# Patient Record
Sex: Male | Born: 1945 | Race: Black or African American | Hispanic: No | Smoking: Current every day smoker
Health system: Southern US, Community
[De-identification: ages and names within clinical notes are randomized; demographics above are authoritative.]

## PROBLEM LIST (undated history)

## (undated) DIAGNOSIS — Z8719 Personal history of other diseases of the digestive system: Secondary | ICD-10-CM

## (undated) DIAGNOSIS — E119 Type 2 diabetes mellitus without complications: Secondary | ICD-10-CM

## (undated) DIAGNOSIS — E785 Hyperlipidemia, unspecified: Secondary | ICD-10-CM

## (undated) DIAGNOSIS — B029 Zoster without complications: Secondary | ICD-10-CM

## (undated) DIAGNOSIS — I10 Essential (primary) hypertension: Secondary | ICD-10-CM

## (undated) HISTORY — PX: OTHER SURGICAL HISTORY: SHX169

## (undated) HISTORY — DX: Hyperlipidemia, unspecified: E78.5

## (undated) HISTORY — DX: Type 2 diabetes mellitus without complications: E11.9

## (undated) HISTORY — DX: Zoster without complications: B02.9

## (undated) HISTORY — DX: Essential (primary) hypertension: I10

---

## 2004-09-26 ENCOUNTER — Emergency Department: Payer: Self-pay | Admitting: Emergency Medicine

## 2006-06-07 ENCOUNTER — Other Ambulatory Visit: Payer: Self-pay

## 2006-06-07 ENCOUNTER — Inpatient Hospital Stay: Payer: Self-pay | Admitting: Internal Medicine

## 2007-05-17 ENCOUNTER — Inpatient Hospital Stay: Payer: Self-pay | Admitting: Internal Medicine

## 2007-05-17 ENCOUNTER — Other Ambulatory Visit: Payer: Self-pay

## 2007-07-03 ENCOUNTER — Emergency Department: Payer: Self-pay | Admitting: Emergency Medicine

## 2009-06-23 ENCOUNTER — Emergency Department: Payer: Self-pay | Admitting: Emergency Medicine

## 2010-02-27 ENCOUNTER — Ambulatory Visit: Payer: Self-pay | Admitting: Ophthalmology

## 2010-03-04 ENCOUNTER — Ambulatory Visit: Payer: Self-pay | Admitting: Cardiovascular Disease

## 2010-03-11 ENCOUNTER — Ambulatory Visit: Payer: Self-pay | Admitting: Ophthalmology

## 2013-01-26 DIAGNOSIS — Z9849 Cataract extraction status, unspecified eye: Secondary | ICD-10-CM | POA: Insufficient documentation

## 2013-05-16 ENCOUNTER — Emergency Department: Payer: Self-pay | Admitting: Emergency Medicine

## 2013-05-16 LAB — CBC
HGB: 12.2 g/dL — ABNORMAL LOW (ref 13.0–18.0)
MCH: 33.8 pg (ref 26.0–34.0)
MCV: 99 fL (ref 80–100)
Platelet: 196 10*3/uL (ref 150–440)
RDW: 13 % (ref 11.5–14.5)
WBC: 7.6 10*3/uL (ref 3.8–10.6)

## 2013-05-16 LAB — COMPREHENSIVE METABOLIC PANEL
Albumin: 3.6 g/dL (ref 3.4–5.0)
Alkaline Phosphatase: 87 U/L
Anion Gap: 5 — ABNORMAL LOW (ref 7–16)
Bilirubin,Total: 0.3 mg/dL (ref 0.2–1.0)
Calcium, Total: 9.3 mg/dL (ref 8.5–10.1)
Creatinine: 1.18 mg/dL (ref 0.60–1.30)
EGFR (African American): >60
Glucose: 360 mg/dL — ABNORMAL HIGH (ref 65–99)
Potassium: 4.3 mmol/L (ref 3.5–5.1)
SGOT(AST): 15 U/L (ref 15–37)
SGPT (ALT): 16 U/L (ref 12–78)
Total Protein: 7.4 g/dL (ref 6.4–8.2)

## 2013-08-21 DIAGNOSIS — N2889 Other specified disorders of kidney and ureter: Secondary | ICD-10-CM | POA: Insufficient documentation

## 2013-08-22 DIAGNOSIS — IMO0002 Reserved for concepts with insufficient information to code with codable children: Secondary | ICD-10-CM | POA: Insufficient documentation

## 2013-08-25 DIAGNOSIS — K5790 Diverticulosis of intestine, part unspecified, without perforation or abscess without bleeding: Secondary | ICD-10-CM | POA: Insufficient documentation

## 2013-08-25 DIAGNOSIS — K648 Other hemorrhoids: Secondary | ICD-10-CM | POA: Insufficient documentation

## 2014-04-13 DIAGNOSIS — N5201 Erectile dysfunction due to arterial insufficiency: Secondary | ICD-10-CM | POA: Insufficient documentation

## 2015-12-13 DIAGNOSIS — E8729 Other acidosis: Secondary | ICD-10-CM | POA: Insufficient documentation

## 2017-07-12 ENCOUNTER — Other Ambulatory Visit: Payer: Self-pay

## 2017-07-12 ENCOUNTER — Inpatient Hospital Stay
Admission: EM | Admit: 2017-07-12 | Discharge: 2017-07-14 | DRG: 347 | Discharge status: Against Medical Advice | Payer: Medicare Other | Attending: Internal Medicine | Admitting: Internal Medicine

## 2017-07-12 ENCOUNTER — Encounter: Payer: Self-pay | Admitting: Emergency Medicine

## 2017-07-12 DIAGNOSIS — K644 Residual hemorrhoidal skin tags: Secondary | ICD-10-CM | POA: Diagnosis present

## 2017-07-12 DIAGNOSIS — K529 Noninfective gastroenteritis and colitis, unspecified: Secondary | ICD-10-CM | POA: Diagnosis present

## 2017-07-12 DIAGNOSIS — N183 Chronic kidney disease, stage 3 (moderate): Secondary | ICD-10-CM | POA: Diagnosis present

## 2017-07-12 DIAGNOSIS — I868 Varicose veins of other specified sites: Secondary | ICD-10-CM | POA: Diagnosis present

## 2017-07-12 DIAGNOSIS — E119 Type 2 diabetes mellitus without complications: Secondary | ICD-10-CM | POA: Diagnosis present

## 2017-07-12 DIAGNOSIS — K228 Other specified diseases of esophagus: Secondary | ICD-10-CM | POA: Diagnosis not present

## 2017-07-12 DIAGNOSIS — K5730 Diverticulosis of large intestine without perforation or abscess without bleeding: Secondary | ICD-10-CM | POA: Diagnosis not present

## 2017-07-12 DIAGNOSIS — K648 Other hemorrhoids: Secondary | ICD-10-CM | POA: Diagnosis not present

## 2017-07-12 DIAGNOSIS — K226 Gastro-esophageal laceration-hemorrhage syndrome: Secondary | ICD-10-CM | POA: Diagnosis present

## 2017-07-12 DIAGNOSIS — F17210 Nicotine dependence, cigarettes, uncomplicated: Secondary | ICD-10-CM | POA: Diagnosis present

## 2017-07-12 DIAGNOSIS — Z79899 Other long term (current) drug therapy: Secondary | ICD-10-CM | POA: Diagnosis not present

## 2017-07-12 DIAGNOSIS — K5731 Diverticulosis of large intestine without perforation or abscess with bleeding: Secondary | ICD-10-CM | POA: Diagnosis present

## 2017-07-12 DIAGNOSIS — K259 Gastric ulcer, unspecified as acute or chronic, without hemorrhage or perforation: Secondary | ICD-10-CM | POA: Diagnosis present

## 2017-07-12 DIAGNOSIS — Z7984 Long term (current) use of oral hypoglycemic drugs: Secondary | ICD-10-CM | POA: Diagnosis not present

## 2017-07-12 DIAGNOSIS — R42 Dizziness and giddiness: Secondary | ICD-10-CM

## 2017-07-12 DIAGNOSIS — I10 Essential (primary) hypertension: Secondary | ICD-10-CM | POA: Diagnosis present

## 2017-07-12 DIAGNOSIS — R571 Hypovolemic shock: Secondary | ICD-10-CM | POA: Diagnosis not present

## 2017-07-12 DIAGNOSIS — K642 Third degree hemorrhoids: Secondary | ICD-10-CM | POA: Diagnosis present

## 2017-07-12 DIAGNOSIS — K3189 Other diseases of stomach and duodenum: Secondary | ICD-10-CM | POA: Diagnosis not present

## 2017-07-12 DIAGNOSIS — K625 Hemorrhage of anus and rectum: Secondary | ICD-10-CM | POA: Diagnosis present

## 2017-07-12 DIAGNOSIS — K922 Gastrointestinal hemorrhage, unspecified: Secondary | ICD-10-CM

## 2017-07-12 DIAGNOSIS — K921 Melena: Secondary | ICD-10-CM | POA: Diagnosis not present

## 2017-07-12 DIAGNOSIS — D62 Acute posthemorrhagic anemia: Secondary | ICD-10-CM | POA: Diagnosis present

## 2017-07-12 DIAGNOSIS — N400 Enlarged prostate without lower urinary tract symptoms: Secondary | ICD-10-CM | POA: Diagnosis present

## 2017-07-12 DIAGNOSIS — K633 Ulcer of intestine: Secondary | ICD-10-CM | POA: Diagnosis not present

## 2017-07-12 HISTORY — DX: Type 2 diabetes mellitus without complications: E11.9

## 2017-07-12 HISTORY — DX: Personal history of other diseases of the digestive system: Z87.19

## 2017-07-12 LAB — COMPREHENSIVE METABOLIC PANEL
ALK PHOS: 63 U/L (ref 38–126)
ALT: 11 U/L — ABNORMAL LOW (ref 17–63)
AST: 22 U/L (ref 15–41)
Albumin: 4.1 g/dL (ref 3.5–5.0)
Anion gap: 9 (ref 5–15)
BUN: 22 mg/dL — AB (ref 6–20)
CALCIUM: 9.5 mg/dL (ref 8.9–10.3)
CHLORIDE: 108 mmol/L (ref 101–111)
CO2: 23 mmol/L (ref 22–32)
Creatinine, Ser: 1.45 mg/dL — ABNORMAL HIGH (ref 0.61–1.24)
GFR calc Af Amer: 54 mL/min — ABNORMAL LOW (ref >60)
GFR, EST NON AFRICAN AMERICAN: 46 mL/min — AB (ref >60)
Glucose, Bld: 297 mg/dL — ABNORMAL HIGH (ref 65–99)
Potassium: 3.7 mmol/L (ref 3.5–5.1)
Sodium: 140 mmol/L (ref 135–145)
Total Bilirubin: 0.4 mg/dL (ref 0.3–1.2)
Total Protein: 7.1 g/dL (ref 6.5–8.1)

## 2017-07-12 LAB — CBC
HCT: 31.1 % — ABNORMAL LOW (ref 40.0–52.0)
HEMATOCRIT: 30.1 % — AB (ref 40.0–52.0)
HEMOGLOBIN: 10 g/dL — AB (ref 13.0–18.0)
Hemoglobin: 10.5 g/dL — ABNORMAL LOW (ref 13.0–18.0)
MCH: 34.4 pg — AB (ref 26.0–34.0)
MCH: 34.1 pg — ABNORMAL HIGH (ref 26.0–34.0)
MCHC: 33.9 g/dL (ref 32.0–36.0)
MCHC: 33.4 g/dL (ref 32.0–36.0)
MCV: 101.5 fL — AB (ref 80.0–100.0)
MCV: 102 fL — AB (ref 80.0–100.0)
PLATELETS: 195 10*3/uL (ref 150–440)
Platelets: 198 10*3/uL (ref 150–440)
RBC: 3.06 MIL/uL — ABNORMAL LOW (ref 4.40–5.90)
RBC: 2.95 MIL/uL — ABNORMAL LOW (ref 4.40–5.90)
RDW: 14.1 % (ref 11.5–14.5)
RDW: 14 % (ref 11.5–14.5)
WBC: 6.4 10*3/uL (ref 3.8–10.6)
WBC: 8.5 10*3/uL (ref 3.8–10.6)

## 2017-07-12 MED ORDER — ACETAMINOPHEN 325 MG PO TABS: 650.0000 mg | ORAL_TABLET | Freq: Four times a day (QID) | ORAL | Status: DC | PRN

## 2017-07-12 MED ORDER — INSULIN ASPART 100 UNIT/ML ~~LOC~~ SOLN
0.0000 [IU] | Freq: Three times a day (TID) | SUBCUTANEOUS | Status: DC
Start: 2017-07-13 — End: 2017-07-13

## 2017-07-12 MED ORDER — BISACODYL 5 MG PO TBEC: 5.0000 mg | DELAYED_RELEASE_TABLET | Freq: Every day | ORAL | Status: DC | PRN

## 2017-07-12 MED ORDER — ONDANSETRON HCL 4 MG PO TABS: 4.0000 mg | ORAL_TABLET | Freq: Four times a day (QID) | ORAL | Status: DC | PRN

## 2017-07-12 MED ORDER — SODIUM CHLORIDE 0.9 % IV SOLN
80.0000 mg | Freq: Once | INTRAVENOUS | Status: AC
  Administered 2017-07-13: 01:00:00 80 mg via INTRAVENOUS
  Filled 2017-07-12: qty 80

## 2017-07-12 MED ORDER — INSULIN GLARGINE 100 UNIT/ML ~~LOC~~ SOLN
10.0000 [IU] | Freq: Every day | SUBCUTANEOUS | Status: DC
  Filled 2017-07-12 (×4): qty 0.1

## 2017-07-12 MED ORDER — ATORVASTATIN CALCIUM 20 MG PO TABS: 40.0000 mg | ORAL_TABLET | Freq: Every day | ORAL | Status: DC

## 2017-07-12 MED ORDER — HYDROCODONE-ACETAMINOPHEN 5-325 MG PO TABS: 1.0000 | ORAL_TABLET | ORAL | Status: DC | PRN

## 2017-07-12 MED ORDER — SODIUM CHLORIDE 0.9 % IV SOLN
8.0000 mg/h | INTRAVENOUS | Status: DC
  Administered 2017-07-13 – 2017-07-14 (×4): 8 mg/h via INTRAVENOUS
  Filled 2017-07-12 (×5): qty 80

## 2017-07-12 MED ORDER — PANTOPRAZOLE SODIUM 40 MG IV SOLR: 40.0000 mg | Freq: Two times a day (BID) | INTRAVENOUS | Status: DC

## 2017-07-12 MED ORDER — SODIUM CHLORIDE 0.9 % IV BOLUS (SEPSIS)
1000.0000 mL | Freq: Once | INTRAVENOUS | Status: AC
  Administered 2017-07-14: 500 mL via INTRAVENOUS

## 2017-07-12 MED ORDER — LISINOPRIL 10 MG PO TABS: 10.0000 mg | ORAL_TABLET | Freq: Every day | ORAL | Status: DC

## 2017-07-12 MED ORDER — ACETAMINOPHEN 650 MG RE SUPP: 650.0000 mg | Freq: Four times a day (QID) | RECTAL | Status: DC | PRN

## 2017-07-12 MED ORDER — TAMSULOSIN HCL 0.4 MG PO CAPS: 0.4000 mg | ORAL_CAPSULE | Freq: Every day | ORAL | Status: DC

## 2017-07-12 MED ORDER — ONDANSETRON HCL 4 MG/2ML IJ SOLN
4.0000 mg | Freq: Four times a day (QID) | INTRAMUSCULAR | Status: DC | PRN
  Administered 2017-07-13: 4 mg via INTRAVENOUS
  Filled 2017-07-12: qty 2

## 2017-07-12 MED ORDER — SODIUM CHLORIDE 0.9 % IV SOLN
INTRAVENOUS | Status: DC
  Administered 2017-07-13: 01:00:00 via INTRAVENOUS

## 2017-07-12 MED ORDER — POLYETHYLENE GLYCOL 3350 17 G PO PACK: 17.0000 g | PACK | Freq: Every day | ORAL | Status: DC | PRN

## 2017-07-12 NOTE — ED Triage Notes (Signed)
Says bright red blood rectally since yesterday.  Yesterday it was with stool, but just had blood come out today.
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
## 2017-07-12 NOTE — ED Notes (Signed)
Pt refusing admission. CBC repeated.
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
## 2017-07-12 NOTE — ED Notes (Signed)
Report called to floor

## 2017-07-13 ENCOUNTER — Encounter: Payer: Self-pay | Admitting: Adult Health

## 2017-07-13 ENCOUNTER — Other Ambulatory Visit: Payer: Self-pay

## 2017-07-13 DIAGNOSIS — R571 Hypovolemic shock: Secondary | ICD-10-CM

## 2017-07-13 DIAGNOSIS — K922 Gastrointestinal hemorrhage, unspecified: Secondary | ICD-10-CM

## 2017-07-13 LAB — IRON AND TIBC
Iron: 337 ug/dL — ABNORMAL HIGH (ref 45–182)
SATURATION RATIOS: 217 % — AB (ref 17.9–39.5)
TIBC: 155 ug/dL — AB (ref 250–450)

## 2017-07-13 LAB — GLUCOSE, CAPILLARY
GLUCOSE-CAPILLARY: 388 mg/dL — AB (ref 65–99)
Glucose-Capillary: 323 mg/dL — ABNORMAL HIGH (ref 65–99)
Glucose-Capillary: 256 mg/dL — ABNORMAL HIGH (ref 65–99)
Glucose-Capillary: 269 mg/dL — ABNORMAL HIGH (ref 65–99)
Glucose-Capillary: 221 mg/dL — ABNORMAL HIGH (ref 65–99)

## 2017-07-13 LAB — BASIC METABOLIC PANEL
Anion gap: 6 (ref 5–15)
BUN: 34 mg/dL — AB (ref 6–20)
CALCIUM: 8.3 mg/dL — AB (ref 8.9–10.3)
CO2: 21 mmol/L — AB (ref 22–32)
CREATININE: 1.76 mg/dL — AB (ref 0.61–1.24)
Chloride: 112 mmol/L — ABNORMAL HIGH (ref 101–111)
GFR calc non Af Amer: 37 mL/min — ABNORMAL LOW (ref >60)
GFR, EST AFRICAN AMERICAN: 42 mL/min — AB (ref >60)
Glucose, Bld: 346 mg/dL — ABNORMAL HIGH (ref 65–99)
Potassium: 4.6 mmol/L (ref 3.5–5.1)
SODIUM: 139 mmol/L (ref 135–145)

## 2017-07-13 LAB — FERRITIN: Ferritin: 47 ng/mL (ref 24–336)

## 2017-07-13 LAB — RETICULOCYTES
RBC.: 2.06 MIL/uL — ABNORMAL LOW (ref 4.40–5.90)
RETIC CT PCT: 2.8 % (ref 0.4–3.1)
Retic Count, Absolute: 57.7 10*3/uL (ref 19.0–183.0)

## 2017-07-13 LAB — HEMOGLOBIN
Hemoglobin: 6.7 g/dL — ABNORMAL LOW (ref 13.0–18.0)
Hemoglobin: 7 g/dL — ABNORMAL LOW (ref 13.0–18.0)
Hemoglobin: 7.5 g/dL — ABNORMAL LOW (ref 13.0–18.0)
Hemoglobin: 9 g/dL — ABNORMAL LOW (ref 13.0–18.0)

## 2017-07-13 LAB — PREPARE RBC (CROSSMATCH)

## 2017-07-13 LAB — FOLATE: Folate: 17.3 ng/mL (ref >5.9)

## 2017-07-13 LAB — ABO/RH: ABO/RH(D): O POS

## 2017-07-13 LAB — PROTIME-INR
INR: 1.33
PROTHROMBIN TIME: 16.4 s — AB (ref 11.4–15.2)

## 2017-07-13 LAB — VITAMIN B12: Vitamin B-12: 146 pg/mL — ABNORMAL LOW (ref 180–914)

## 2017-07-13 MED ORDER — SODIUM CHLORIDE 0.9 % IV SOLN
Freq: Once | INTRAVENOUS | Status: AC
  Administered 2017-07-14: 13:00:00 via INTRAVENOUS

## 2017-07-13 MED ORDER — SODIUM CHLORIDE 0.9 % IV SOLN
510.0000 mg | Freq: Once | INTRAVENOUS | Status: AC
  Administered 2017-07-13: 510 mg via INTRAVENOUS
  Filled 2017-07-13: qty 17

## 2017-07-13 MED ORDER — LACTATED RINGERS IV SOLN
INTRAVENOUS | Status: DC
  Administered 2017-07-13: 14:00:00 via INTRAVENOUS
  Administered 2017-07-14: 50 mL/h via INTRAVENOUS

## 2017-07-13 MED ORDER — SODIUM CHLORIDE 0.9 % IV BOLUS (SEPSIS)
1000.0000 mL | Freq: Once | INTRAVENOUS | Status: AC
  Administered 2017-07-13: 1000 mL via INTRAVENOUS

## 2017-07-13 MED ORDER — INSULIN ASPART 100 UNIT/ML ~~LOC~~ SOLN
0.0000 [IU] | SUBCUTANEOUS | Status: DC
Start: 2017-07-13 — End: 2017-07-14

## 2017-07-13 MED ORDER — PEG 3350-KCL-NA BICARB-NACL 420 G PO SOLR
4000.0000 mL | Freq: Once | ORAL | Status: AC
  Administered 2017-07-13: 4000 mL via ORAL
  Filled 2017-07-13: qty 4000

## 2017-07-13 MED ORDER — SODIUM CHLORIDE 0.9 % IV SOLN
Freq: Once | INTRAVENOUS | Status: AC
  Administered 2017-07-13: 06:00:00 via INTRAVENOUS

## 2017-07-13 NOTE — Progress Notes (Signed)
Chaplain responded to Rapid Response announcement. Upon arrival the Chaplain attempted to assist the patient's niece but she did not required assistance.

## 2017-07-13 NOTE — Progress Notes (Signed)
Prior to transfer to the floor, patient had three to four episodes of hematochezia of unknown origin.  Patient declined all interventions at first; medications, IV fluids, blood draws.  RN, lab tech, and floor tech all discussed the importance of following MD orders.  Patient verbalized uncertainty of staying. RN further explained PPI therapy may be considered to decrease higher risk of hemorrhage intravenous PPI is recommended to reduce further bleeding" Patient had two further episodes of hematochezia resulting in extreme lightheadedness.  RN reported to charge nurse and MD on call.

## 2017-07-13 NOTE — Care Management (Signed)
Patient transferred to icu from [facility] for increased bright red blood from rectum and coffee ground emesis. Substantial drop in blood pressure requiring pressors and drop in hemoglobin from 10 to 7. Will  be transfused per protocol.

## 2017-07-13 NOTE — Progress Notes (Signed)
Inpatient Diabetes Program Recommendations  AACE/ADA: New Consensus Statement on Inpatient Glycemic Control (2015)  Target Ranges:  Prepandial:   less than 140 mg/dL      Peak postprandial:   less than 180 mg/dL (1-2 hours)      Critically ill patients:  140 - 180 mg/dL   Lab Results  Component Value Date   GLUCAP 323 (H) 07/13/2017    Review of Glycemic ControlResults for Mitchell MantleSNIPES, Jacquez R (MRN [id number]) as of 07/13/2017 10:05  Ref. Range 07/13/2017 05:46 07/13/2017 07:26  Glucose-Capillary Latest Ref Range: 65 - 99 mg/dL 409388 (H) 811323 (H)   Diabetes history: Type 2 DM Outpatient Diabetes medications: Amaryl 1 mg daily, Invokana 300 mg daily, Metformin 1000 mg bid, Actos 30 mg daily Current orders for Inpatient glycemic control:  Lantus 10 units daily  Inpatient Diabetes Program Recommendations:   Note elevated blood sugars.  Please add Novolog sensitive q 4 hours.   Thanks,  Beryl MeagerJenny Taiyo Kozma, RN, BC-ADM Inpatient Diabetes Coordinator Pager [phone] (8a-5p)

## 2017-07-13 NOTE — Progress Notes (Signed)
[facility] - [place] [facility]   PATIENT NAME: Mitchell Price    MR#:  [id number]  DATE OF BIRTH:  12-14-1945  SUBJECTIVE:  Came in with hematochezia---several of them at home None here   REVIEW OF SYSTEMS:   Review of Systems  Constitutional: Negative for chills, fever and weight loss.  HENT: Negative for ear discharge, ear pain and nosebleeds.   Eyes: Negative for blurred vision, pain and discharge.  Respiratory: Negative for sputum production, shortness of breath, wheezing and stridor.   Cardiovascular: Negative for chest pain, palpitations, orthopnea and PND.  Gastrointestinal: Positive for blood in stool. Negative for abdominal pain, diarrhea, nausea and vomiting.  Genitourinary: Negative for frequency and urgency.  Musculoskeletal: Negative for back pain and joint pain.  Neurological: Positive for weakness. Negative for sensory change, speech change and focal weakness.  Psychiatric/Behavioral: Negative for depression and hallucinations. The patient is not nervous/anxious.    Tolerating Diet:CLD Tolerating PT: pending  DRUG ALLERGIES:  No Known Allergies  VITALS:  Blood pressure (!) 108/49, pulse 84, temperature 98.8 F (37.1 C), temperature source Oral, resp. rate 15, height 6\' 5"  (1.956 m), weight 78.9 kg (173 lb 15.1 oz), SpO2 97 %.  PHYSICAL EXAMINATION:   Physical Exam  GENERAL:  72 y.o.-year-old patient lying in the bed with no acute distress. Pallor++ EYES: Pupils equal, round, reactive to light and accommodation. No scleral icterus. Extraocular muscles intact.  HEENT: Head atraumatic, normocephalic. Oropharynx and nasopharynx clear.  NECK:  Supple, no jugular venous distention. No thyroid enlargement, no tenderness.  LUNGS: Normal breath sounds bilaterally, no wheezing, rales, rhonchi. No use of accessory muscles of respiration.  CARDIOVASCULAR: S1, S2 normal. No murmurs, rubs, or gallops.  ABDOMEN: Soft, nontender,  nondistended. Bowel sounds present. No organomegaly or mass.  EXTREMITIES: No cyanosis, clubbing or edema b/l.    NEUROLOGIC: Cranial nerves II through XII are intact. No focal Motor or sensory deficits b/l.   PSYCHIATRIC:  patient is alert and oriented x 3.  SKIN: No obvious rash, lesion, or ulcer.   LABORATORY PANEL:  CBC Recent Labs  Lab 07/12/17 1931  07/13/17 1635  WBC 8.5  --   --   HGB 10.0*   < > 6.7*  HCT 30.1*  --   --   PLT 198  --   --    < > = values in this interval not displayed.    Chemistries  Recent Labs  Lab 07/12/17 1527 07/13/17 0408  NA 140 139  K 3.7 4.6  CL 108 112*  CO2 23 21*  GLUCOSE 297* 346*  BUN 22* 34*  CREATININE 1.45* 1.76*  CALCIUM 9.5 8.3*  AST 22  --   ALT 11*  --   ALKPHOS 63  --   BILITOT 0.4  --    Cardiac Enzymes No results for input(s): TROPONINI in the last 168 hours. RADIOLOGY:  No results found. ASSESSMENT AND PLAN:  Corleone Biegler  is a 72 y.o. male with a known history of diabetes, essential hypertension and BPH who presents today due to rectal bleeding. Patient reports that he has had 2 large bloody bowel movements at home and 2 large bloody bowel movements while he was in the waiting room in the emergency room  1. Rectal bleed: with post hemorrhagic anemia appears diverticular/hemorrhoidal -came with hgb 10.4--7.0--1 unit BT--7.6--6.7--2nd unit BT -IV PPI -GI input noted--EGD and colonoscopy tomorrow  2. Diabetes: He will follow-up with his outpatient physician and continue outpatient  medications -pt refuses to take insulin  3. Essential hypertension: Stable  4. BPH: on flomax  5. Tobacco dependence: Patient is encouraged to quit smoking. Counseling was provided for 4 minutes.  D/w pt and wife   Case discussed with Care Management/Social Worker. Management plans discussed with the patient, family and they are in agreement.  CODE STATUS: full  DVT Prophylaxis: SCD  TOTAL TIME TAKING CARE OF THIS  PATIENT: *25* minutes.  >50% time spent on counselling and coordination of care  POSSIBLE D/C IN *1-2DAYS, DEPENDING ON CLINICAL CONDITION.  Note: This dictation was prepared with Dragon dictation along with smaller phrase technology. Any transcriptional errors that result from this process are unintentional.  Enedina FinnerSona Graham Hyun M.D on 07/13/2017 at 6:14 PM  Between 7am to 6pm - Pager - [phone]  After 6pm go to [email or web address] - password [email or web address]  [facility] [place] [facility]  Office  [phone]  CC: Primary care physician; Patient, No Pcp PerPatient ID: Katherine MantleClarence R Ohanesian, male   DOB: [birth year], 72 y.o.   MRN: [id number]
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
## 2017-07-13 NOTE — Progress Notes (Signed)
Notified Dr. Lonn Georgiaonforti that patient is refusing insulin.  Bloods sugars have been in the high 300's - last blood sugar 256.  No new orders at this time.

## 2017-07-13 NOTE — Progress Notes (Signed)
This nurse and NP have discussed with patient in the presence of his wife the risks versus benefits of moving about the room without assistance. While patient acknowledges that he understands the importance of calling for assistance he has gotten out of bed without calling for assistance twice since having conversations with staff. When reminded of using the call light patient insists he does not need help. 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 Sonda Rumbleana Blakeney NP made aware of patient refusing insulin. Patient continues to refuse insulin for CBG of 176 and 159. Patient has has watery red stool x3 this shift approximately 1700 mL in total. Hemoglobin 7.6 after transfusion of I unit PRBC.

## 2017-07-13 NOTE — Progress Notes (Signed)
Discussed fall risk with pt due to active bleeding per rectum and nursing concerns pt could potentially fall during ambulation to bedside commode because pt is refusing assistance.  The pt states he occasionally has dizziness when ambulating, however he will only allow the nursing staff to standby assist.  I explained to the pt he could potentially fall leading to possible injury, however he is still only willing to let nursing staff standby assist him to the bedside commode despite risks.  He also states he left AMA during a previous hospital admission because hospital staff were "bothering him."  However, pt currently agreeable to remain in the hospital for additional workup and treatment. Will continue to monitor pt.  Sonda Rumbleana Arieona Swaggerty, AGNP  Pulmonary/Critical Care Pager [phone] (please enter 7 digits) PCCM Consult Pager [phone] (please enter 7 digits)
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
## 2017-07-13 NOTE — Progress Notes (Signed)
Patient continues to have copious amounts of hematochezia to melena colored blood; five to seven saturated pads and bedding. Last drawn hemoglobin was 7.0, trended down from 10.5.  RN called MD on call Dr. Tobi BastosPyreddy to inform of change of status and latest result.  Verbal order given to transfuse one unit of blood.  In the interim, patient had several episodes of vomiting with dark coffee ground mixed with bile and with two more saturated pads.  Decision was made to call a rapid, in which resulted in patient being transferred to step down.  Patient transferred to room 10, report given to Murphy Oil[employer] Erica.

## 2017-07-14 ENCOUNTER — Inpatient Hospital Stay: Payer: Medicare Other | Admitting: Anesthesiology

## 2017-07-14 ENCOUNTER — Encounter
Admission: EM | Discharge status: Against Medical Advice | Payer: Self-pay | Source: Home / Self Care | Attending: Internal Medicine

## 2017-07-14 ENCOUNTER — Encounter: Payer: Self-pay | Admitting: Radiology

## 2017-07-14 ENCOUNTER — Inpatient Hospital Stay: Payer: Medicare Other

## 2017-07-14 DIAGNOSIS — K921 Melena: Secondary | ICD-10-CM

## 2017-07-14 DIAGNOSIS — K633 Ulcer of intestine: Secondary | ICD-10-CM

## 2017-07-14 DIAGNOSIS — K642 Third degree hemorrhoids: Secondary | ICD-10-CM

## 2017-07-14 DIAGNOSIS — K648 Other hemorrhoids: Secondary | ICD-10-CM

## 2017-07-14 DIAGNOSIS — K3189 Other diseases of stomach and duodenum: Secondary | ICD-10-CM

## 2017-07-14 DIAGNOSIS — D62 Acute posthemorrhagic anemia: Secondary | ICD-10-CM

## 2017-07-14 DIAGNOSIS — K5730 Diverticulosis of large intestine without perforation or abscess without bleeding: Secondary | ICD-10-CM

## 2017-07-14 DIAGNOSIS — K228 Other specified diseases of esophagus: Secondary | ICD-10-CM

## 2017-07-14 HISTORY — PX: COLONOSCOPY: SHX5424

## 2017-07-14 HISTORY — PX: ESOPHAGOGASTRODUODENOSCOPY: SHX5428

## 2017-07-14 LAB — TYPE AND SCREEN
ABO/RH(D): O POS
Antibody Screen: NEGATIVE
UNIT DIVISION: 0
Unit division: 0

## 2017-07-14 LAB — GLUCOSE, CAPILLARY
GLUCOSE-CAPILLARY: 147 mg/dL — AB (ref 65–99)
GLUCOSE-CAPILLARY: 159 mg/dL — AB (ref 65–99)
GLUCOSE-CAPILLARY: 176 mg/dL — AB (ref 65–99)
GLUCOSE-CAPILLARY: 220 mg/dL — AB (ref 65–99)
Glucose-Capillary: 159 mg/dL — ABNORMAL HIGH (ref 65–99)

## 2017-07-14 LAB — BPAM RBC
Blood Product Expiration Date: 201902162359
Blood Product Expiration Date: 201903042359
ISSUE DATE / TIME: 201902050537
ISSUE DATE / TIME: 201902052019
Unit Type and Rh: 5100
Unit Type and Rh: 5100

## 2017-07-14 LAB — BASIC METABOLIC PANEL
ANION GAP: 8 (ref 5–15)
BUN: 27 mg/dL — ABNORMAL HIGH (ref 6–20)
CO2: 21 mmol/L — AB (ref 22–32)
Calcium: 7.9 mg/dL — ABNORMAL LOW (ref 8.9–10.3)
Chloride: 114 mmol/L — ABNORMAL HIGH (ref 101–111)
Creatinine, Ser: 1.28 mg/dL — ABNORMAL HIGH (ref 0.61–1.24)
GFR calc Af Amer: >60 mL/min (ref >60)
GFR, EST NON AFRICAN AMERICAN: 54 mL/min — AB (ref >60)
GLUCOSE: 175 mg/dL — AB (ref 65–99)
POTASSIUM: 4.2 mmol/L (ref 3.5–5.1)
Sodium: 143 mmol/L (ref 135–145)

## 2017-07-14 LAB — HEMOGLOBIN
HEMOGLOBIN: 8.2 g/dL — AB (ref 13.0–18.0)
Hemoglobin: 7.7 g/dL — ABNORMAL LOW (ref 13.0–17.0)

## 2017-07-14 LAB — HEMOGLOBIN AND HEMATOCRIT, BLOOD
HEMATOCRIT: 22.6 % — AB (ref 40.0–52.0)
Hemoglobin: 7.6 g/dL — ABNORMAL LOW (ref 13.0–18.0)

## 2017-07-14 SURGERY — COLONOSCOPY
Anesthesia: General

## 2017-07-14 MED ORDER — CYANOCOBALAMIN 1000 MCG/ML IJ SOLN: 1000.0000 ug | Freq: Once | INTRAMUSCULAR | Status: DC

## 2017-07-14 MED ORDER — LIDOCAINE HCL (PF) 2 % IJ SOLN
INTRAMUSCULAR | Status: DC | PRN
  Administered 2017-07-14: 100 mg via INTRADERMAL

## 2017-07-14 MED ORDER — TECHNETIUM TC 99M-LABELED RED BLOOD CELLS IV KIT
20.0000 | PACK | Freq: Once | INTRAVENOUS | Status: AC | PRN
  Administered 2017-07-14: 23.03 via INTRAVENOUS

## 2017-07-14 MED ORDER — PROPOFOL 500 MG/50ML IV EMUL
INTRAVENOUS | Status: DC | PRN
  Administered 2017-07-14: 150 ug/kg/min via INTRAVENOUS

## 2017-07-14 MED ORDER — PHENYLEPHRINE HCL 10 MG/ML IJ SOLN
INTRAMUSCULAR | Status: DC | PRN
  Administered 2017-07-14 (×7): 100 ug via INTRAVENOUS

## 2017-07-14 MED ORDER — SODIUM CHLORIDE 0.9 % IV SOLN: 510.0000 mg | Freq: Once | INTRAVENOUS | Status: DC

## 2017-07-14 MED ORDER — GLUCERNA SHAKE PO LIQD: 237.0000 mL | Freq: Three times a day (TID) | ORAL | Status: DC

## 2017-07-14 MED ORDER — PROPOFOL 10 MG/ML IV BOLUS
INTRAVENOUS | Status: DC | PRN
  Administered 2017-07-14: 70 mg via INTRAVENOUS
  Administered 2017-07-14: 30 mg via INTRAVENOUS

## 2017-07-14 NOTE — Op Note (Signed)
[facility] Gastroenterology Patient Name: Mitchell Price Procedure Date: 07/14/2017 2:28 PM MRN: [id number] Account #: [id number] Date of Birth: Jan 29, 1946 Admit Type: Inpatient Age: 72 Room: [facility] ENDO ROOM 1 Gender: Male Note Status: Finalized Procedure:            Colonoscopy Indications:          Hematochezia, Acute post hemorrhagic anemia Providers:            Lin Landsman MD, MD Referring MD:         No Local Md, MD (Referring MD) Medicines:            Monitored Anesthesia Care Complications:        No immediate complications. Estimated blood loss: None. Procedure:            Pre-Anesthesia Assessment:                       - Prior to the procedure, a History and Physical was                        performed, and patient medications and allergies were                        reviewed. The patient is competent. The risks and                        benefits of the procedure and the sedation options and                        risks were discussed with the patient. All questions                        were answered and informed consent was obtained.                        Patient identification and proposed procedure were                        verified by the physician, the nurse, the                        anesthesiologist, the anesthetist and the technician in                        the pre-procedure area in the procedure room. Mental                        Status Examination: alert and oriented. Airway                        Examination: normal oropharyngeal airway and neck                        mobility. Respiratory Examination: clear to                        auscultation. CV Examination: normal. Prophylactic                        Antibiotics: The patient does not require prophylactic  antibiotics. Prior Anticoagulants: The patient has                        taken aspirin, last dose was 1 day prior to procedure.           ASA Grade Assessment: II - A patient with mild systemic                        disease. After reviewing the risks and benefits, the                        patient was deemed in satisfactory condition to undergo                        the procedure. The anesthesia plan was to use monitored                        anesthesia care (MAC). Immediately prior to                        administration of medications, the patient was                        re-assessed for adequacy to receive sedatives. The                        heart rate, respiratory rate, oxygen saturations, blood                        pressure, adequacy of pulmonary ventilation, and                        response to care were monitored throughout the                        procedure. The physical status of the patient was                        re-assessed after the procedure.                       After obtaining informed consent, the colonoscope was                        passed under direct vision. Throughout the procedure,                        the patient's blood pressure, pulse, and oxygen                        saturations were monitored continuously. The                        Colonoscope was introduced through the anus and                        advanced to the the terminal ileum. The colonoscopy was                        performed without difficulty. The patient tolerated the  procedure well. The quality of the bowel preparation                        was poor. Findings:      The perianal exam findings include non-thrombosed external hemorrhoids       and non-thrombosed internal hemorrhoids.      The terminal ileum appeared normal.      A large amount of reddish brown liquid stool in left colon and brown       liquid stool in right colon.      Non-bleeding internal hemorrhoids and rectal varices were found during       retroflexion. The hemorrhoids were large. Two bands were  successfully       placed given pt's h/o hematochezia. There was no bleeding during, and at       the end, of the procedure.      A continuous area of nonbleeding ulcerated mucosa/aphtous ulcerations       with no stigmata of recent bleeding was present in the cecum. He has h/o       chronic mild active colitis from prior colonoscopies at [facility]. Therefore,       biopsies not performed.      Multiple small and large-mouthed diverticula were found in the sigmoid       colon and descending colon. Impression:           - Preparation of the colon was poor.                       - Non-thrombosed external hemorrhoids and                        non-thrombosed internal hemorrhoids found on perianal                        exam.                       - The examined portion of the ileum was normal.                       - Stool in the entire examined colon.                       - Non-bleeding internal hemorrhoids. Banded.                       - Mucosal ulceration.                       - Diverticulosis in the sigmoid colon and in the                        descending colon.                       - No specimens collected. Recommendation:       - Return patient to hospital ward for ongoing care.                       - Resume regular diet today.                       - Continue present medications.                       -  Follow up in GI clinic in 2 weeks after discharge.                       - Avoid ASA 47m and NSAIDs Procedure Code(s):    --- Professional ---                       [phone] Colonoscopy, flexible; with band ligation(s)                        (eg, hemorrhoids) Diagnosis Code(s):    --- Professional ---                       K64.4, Residual hemorrhoidal skin tags                       K64.8, Other hemorrhoids                       K63.3, Ulcer of intestine                       K92.1, Melena (includes Hematochezia)                       D62, Acute posthemorrhagic anemia                        K57.30, Diverticulosis of large intestine without                        perforation or abscess without bleeding CPT copyright 2016 American Medical Association. All rights reserved. The codes documented in this report are preliminary and upon coder review may  be revised to meet current compliance requirements. Dr. RUlyess MortRLin LandsmanMD, MD 07/14/2017 3:42:49 PM This report has been signed electronically. Number of Addenda: 0 Note Initiated On: 07/14/2017 2:28 PM Scope Withdrawal Time: 0 hours 20 minutes 16 seconds  Total Procedure Duration: 0 hours 31 minutes 17 seconds       [facility]

## 2017-07-14 NOTE — Progress Notes (Signed)
Spoke with Endoscopy Lab at this time to give report on patient. Expected time for procedure is around 1300.

## 2017-07-14 NOTE — Progress Notes (Signed)
Patient went down to GI Lab for Endoscopy at this time.

## 2017-07-14 NOTE — Anesthesia Postprocedure Evaluation (Signed)
Anesthesia Post Note  Patient: Mitchell Price  Procedure(s) Performed: COLONOSCOPY (N/A ) ESOPHAGOGASTRODUODENOSCOPY (EGD) (N/A )  Patient location during evaluation: Endoscopy Anesthesia Type: General Level of consciousness: awake and alert Pain management: pain level controlled Vital Signs Assessment: post-procedure vital signs reviewed and stable Respiratory status: spontaneous breathing, nonlabored ventilation, respiratory function stable and patient connected to nasal cannula oxygen Cardiovascular status: blood pressure returned to baseline and stable Postop Assessment: no apparent nausea or vomiting Anesthetic complications: no     Last Vitals:  Vitals:   07/14/17 1550 07/14/17 1600  BP: 124/80 116/70  Pulse: 86 77  Resp: 15 15  Temp:    SpO2: 92% 98%    Last Pain:  Vitals:   07/13/17 2335  TempSrc: Oral                 Lenard SimmerAndrew Bev Drennen

## 2017-07-14 NOTE — Anesthesia Post-op Follow-up Note (Signed)
Anesthesia QCDR form completed.        

## 2017-07-14 NOTE — Progress Notes (Signed)
Patient was stating he was leaving AMA. Dr. Lonn Georgiaonforti went and spoke with the patient and the patient agreed to have his scope done and wait for the results before he leaves. Patient has refused morning lab draw that was scheduled. Patient also is pulling off his pulse-ox and is being non-compliant with his monitoring. Educated on the importance of keeping his monitors on. Wife at bedside and present for the discussion with Dr. Lonn Georgiaonforti.
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
## 2017-07-14 NOTE — Progress Notes (Signed)
Patient insisting upon leaving AMA at this time. Per patient's insistence, IV x2 removed, other medical equipment removed and patient is currently in room getting ready to leave. AMA form has already been signed, witnessed by myself and Dr. Lonn Georgiaonforti had spoke with him prior to signing these papers.

## 2017-07-14 NOTE — Progress Notes (Signed)
[facility] Medicine Progess Note    SYNOPSIS   Patient with a history of lower GI/rectal bleeding, had not had any episodes for the past year developed recurrent bleeding with subsequent drop in hemoglobin and altered hemodynamics and transferred to the intensive care unit.  ASSESSMENT/PLAN   Recurrent GI bleeding. Patient had additional episode of bleeding. Status post bleeding scan pending result. Is scheduled for upper and lower endoscopy today. He has been threatening to leave AGAINST MEDICAL ADVICE. I spoke with patient and counseled him on the importance of finding out where he is bleeding from an work redone about it. I told him that I am concerned if he left AGAINST MEDICAL ADVICE and his hemoglobin were to drop he could suffer a fatality. He has agreed to stay for the above studies.  Renal insufficiency. Mild BUN 27/creatinine 1.28, this has significantly improved since admission   INTAKE / OUTPUT:  Intake/Output Summary (Last 24 hours) at 07/14/2017 16100822 Last data filed at 07/14/2017 0700 Gross per 24 hour  Intake 5806.17 ml  Output 1101 ml  Net 4705.17 ml    Name: Mitchell MantleClarence R Price MRN: [id number] DOB: 06-Aug-1945    ADMISSION DATE:  07/12/2017  SUBJECTIVE: Over the last 24 hours patient has had recurrent bleeding, just returned from a bleeding scan per GI. He has threatened to leave AGAINST MEDICAL ADVICE. I spoke to him at length about the importance of finding out why he is bleeding, is significant blood loss, his requirement for transfusion and the risk of fatality if he left AGAINST MEDICAL ADVICE. He has agreed to stay until we get the results of his bleeding scan and endoscopy performed  VITAL SIGNS: Temp:  [97.6 F (36.4 C)-98.8 F (37.1 C)] 97.8 F (36.6 C) (02/05 2335) Pulse Rate:  [76-118] 82 (02/06 0800) Resp:  [12-25] 18 (02/06 0800) BP: (77-121)/(39-85) 115/73 (02/06 0800) SpO2:  [95 %-100 %] 98 % (02/06 0800)  PHYSICAL  EXAMINATION: Physical Examination:   VS: BP 115/73   Pulse 82   Temp 97.8 F (36.6 C) (Oral)   Resp 18   Ht 6\' 5"  (1.956 m)   Wt 173 lb 15.1 oz (78.9 kg)   SpO2 98%   BMI 20.63 kg/m   General Appearance: No distress  Neuro:without focal findings, mental status normal. HEENT: PERRLA, EOM intact. Pulmonary: normal breath sounds   CardiovascularNormal S1,S2.  No m/r/g.   Abdomen: Benign, Soft, non-tender. Skin:   warm, no rashes, no ecchymosis  Extremities: normal, no cyanosis, clubbing.    LABORATORY PANEL:   CBC Recent Labs  Lab 07/12/17 1931  07/14/17 0134  WBC 8.5  --   --   HGB 10.0*   < > 7.7*  7.6*  HCT 30.1*  --  22.6*  PLT 198  --   --    < > = values in this interval not displayed.    Chemistries  Recent Labs  Lab 07/12/17 1527  07/14/17 0134  NA 140   < > 143  K 3.7   < > 4.2  CL 108   < > 114*  CO2 23   < > 21*  GLUCOSE 297*   < > 175*  BUN 22*   < > 27*  CREATININE 1.45*   < > 1.28*  CALCIUM 9.5   < > 7.9*  AST 22  --   --   ALT 11*  --   --   ALKPHOS 63  --   --  BILITOT 0.4  --   --    < > = values in this interval not displayed.    Recent Labs  Lab 07/13/17 1129 07/13/17 1602 07/13/17 2136 07/14/17 0021 07/14/17 0405 07/14/17 0728  GLUCAP 256* 269* 221* 176* 159* 159*   No results for input(s): PHART, PCO2ART, PO2ART in the last 168 hours. Recent Labs  Lab 07/12/17 1527  AST 22  ALT 11*  ALKPHOS 63  BILITOT 0.4  ALBUMIN 4.1    Cardiac Enzymes No results for input(s): TROPONINI in the last 168 hours.  RADIOLOGY:  Nm Gi Blood Loss  Result Date: 07/14/2017 CLINICAL DATA:  Bloody stools since 07/11/2017.  Near syncope. EXAM: NUCLEAR MEDICINE GASTROINTESTINAL BLEEDING SCAN TECHNIQUE: Sequential abdominal images were obtained following intravenous administration of Tc-9m labeled red blood cells. RADIOPHARMACEUTICALS:  23.0 mCi Tc-16m pertechnetate in-vitro labeled red cells. COMPARISON:  05/19/2007. FINDINGS: No areas of  abnormal radiotracer accumulation to indicate active GI bleeding. IMPRESSION: No areas of abnormal radiotracer accumulation to indicate active GI bleeding. Electronically Signed   By: Leanna Battles M.D.   On: 07/14/2017 08:18     Tora Kindred, DO 2/6/2019Patient ID: Mitchell Price, male   DOB: 08/31/1945, 72 y.o.   MRN: [id number]

## 2017-07-14 NOTE — Anesthesia Procedure Notes (Signed)
Date/Time: 07/14/2017 2:46 PM Performed by: Junious SilkNoles, Najai Waszak, CRNA Pre-anesthesia Checklist: Patient identified, Emergency Drugs available, Suction available, Patient being monitored and Timeout performed Oxygen Delivery Method: Nasal cannula

## 2017-07-14 NOTE — Brief Op Note (Signed)
2 bands placed on large hemorrhoids successfully by Dr. Allegra LaiVanga.

## 2017-07-14 NOTE — Progress Notes (Signed)
[facility] - [place] [facility]   PATIENT NAME: Mitchell Price    MR#:  [id number]  DATE OF BIRTH:  [birth year]  SUBJECTIVE:  Came in with hematochezia---several of them at home  Pt is all upset and wants to leave   REVIEW OF SYSTEMS:   Review of Systems  Constitutional: Negative for chills, fever and weight loss.  HENT: Negative for ear discharge, ear pain and nosebleeds.   Eyes: Negative for blurred vision, pain and discharge.  Respiratory: Negative for sputum production, shortness of breath, wheezing and stridor.   Cardiovascular: Negative for chest pain, palpitations, orthopnea and PND.  Gastrointestinal: Positive for blood in stool. Negative for abdominal pain, diarrhea, nausea and vomiting.  Genitourinary: Negative for frequency and urgency.  Musculoskeletal: Negative for back pain and joint pain.  Neurological: Positive for weakness. Negative for sensory change, speech change and focal weakness.  Psychiatric/Behavioral: Negative for depression and hallucinations. The patient is not nervous/anxious.    Tolerating Diet:CLD Tolerating PT: pending  DRUG ALLERGIES:  No Known Allergies  VITALS:  Blood pressure (!) 142/88, pulse 96, temperature 97.8 F (36.6 C), temperature source Oral, resp. rate 16, height 6\' 5"  (1.956 m), weight 78.9 kg (173 lb 15.1 oz), SpO2 100 %.  PHYSICAL EXAMINATION:   Physical Exam  GENERAL:  72 y.o.-year-old patient lying in the bed with no acute distress. Pallor++ EYES: Pupils equal, round, reactive to light and accommodation. No scleral icterus. Extraocular muscles intact.  HEENT: Head atraumatic, normocephalic. Oropharynx and nasopharynx clear.  NECK:  Supple, no jugular venous distention. No thyroid enlargement, no tenderness.  LUNGS: Normal breath sounds bilaterally, no wheezing, rales, rhonchi. No use of accessory muscles of respiration.  CARDIOVASCULAR: S1, S2 normal. No murmurs, rubs, or gallops.  ABDOMEN:  Soft, nontender, nondistended. Bowel sounds present. No organomegaly or mass.  EXTREMITIES: No cyanosis, clubbing or edema b/l.    NEUROLOGIC: Cranial nerves II through XII are intact. No focal Motor or sensory deficits b/l.   PSYCHIATRIC:  patient is alert and oriented x 3.  SKIN: No obvious rash, lesion, or ulcer.   LABORATORY PANEL:  CBC Recent Labs  Lab 07/12/17 1931  07/14/17 0134 07/14/17 0931  WBC 8.5  --   --   --   HGB 10.0*   < > 7.7*  7.6* 8.2*  HCT 30.1*  --  22.6*  --   PLT 198  --   --   --    < > = values in this interval not displayed.    Chemistries  Recent Labs  Lab 07/12/17 1527  07/14/17 0134  NA 140   < > 143  K 3.7   < > 4.2  CL 108   < > 114*  CO2 23   < > 21*  GLUCOSE 297*   < > 175*  BUN 22*   < > 27*  CREATININE 1.45*   < > 1.28*  CALCIUM 9.5   < > 7.9*  AST 22  --   --   ALT 11*  --   --   ALKPHOS 63  --   --   BILITOT 0.4  --   --    < > = values in this interval not displayed.   Cardiac Enzymes No results for input(s): TROPONINI in the last 168 hours. RADIOLOGY:  Nm Gi Blood Loss  Result Date: 07/14/2017 CLINICAL DATA:  Bloody stools since 07/11/2017.  Near syncope. EXAM: NUCLEAR MEDICINE GASTROINTESTINAL BLEEDING SCAN TECHNIQUE: Sequential  abdominal images were obtained following intravenous administration of Tc-46m labeled red blood cells. RADIOPHARMACEUTICALS:  23.0 mCi Tc-27m pertechnetate in-vitro labeled red cells. COMPARISON:  05/19/2007. FINDINGS: No areas of abnormal radiotracer accumulation to indicate active GI bleeding. IMPRESSION: No areas of abnormal radiotracer accumulation to indicate active GI bleeding. Electronically Signed   By: Leanna Battles M.D.   On: 07/14/2017 08:18   ASSESSMENT AND PLAN:  Mitchell Price  is a 72 y.o. male with a known history of diabetes, essential hypertension and BPH who presents today due to rectal bleeding. Patient reports that he has had 2 large bloody bowel movements at home and 2 large  bloody bowel movements while he was in the waiting room in the emergency room  1. Rectal bleed: with post hemorrhagic anemia appears diverticular/hemorrhoidal -came with hgb 10.4--7.0--1 unit BT--7.6--6.7--2nd unit BT--7.6--8.2-IV PPI -GI input noted--EGD and colonoscopy today  2. Diabetes: He will follow-up with his outpatient physician and continue outpatient medications -pt refuses to take insulin  3. Essential hypertension: Stable  4. BPH: on flomax  5. Tobacco dependence: Patient is encouraged to quit smoking. Counseling was provided for 4 minutes.  D/w pt and wife   Case discussed with Care Management/Social Worker. Management plans discussed with the patient, family and they are in agreement.  CODE STATUS: full  DVT Prophylaxis: SCD  TOTAL TIME TAKING CARE OF THIS PATIENT: *25* minutes.  >50% time spent on counselling and coordination of care  POSSIBLE D/C IN *1-2DAYS, DEPENDING ON CLINICAL CONDITION.  Note: This dictation was prepared with Dragon dictation along with smaller phrase technology. Any transcriptional errors that result from this process are unintentional.  Enedina Finner M.D on 07/14/2017 at 1:28 PM  Between 7am to 6pm - Pager - [phone]  After 6pm go to [email or web address] - password [email or web address]  [facility]  Office  [phone]  CC: Primary care physician; Patient, No Pcp PerPatient ID: Mitchell Price, male   DOB: 06/07/1946, 72 y.o.   MRN: [id number]

## 2017-07-14 NOTE — Progress Notes (Addendum)
EGD and colonoscopy post procedure note:  EGD findings: - Normal duodenal bulb and second portion of the duodenum. - Granular, linearly eroded, nodular and scarred (post ulcer) mucosa in the gastric body. Biopsied. - Mucosal nodule found in the esophagus, GE junction. Biopsied. - Probable MW tear at the GE junction  Colonoscopy findings: - Preparation of the colon was poor - Non-thrombosed external hemorrhoids and non-thrombosed internal hemorrhoids found on perianal exam. - The examined portion of the ileum was normal. - Stool in the entire examined colon. - Non-bleeding internal hemorrhoids. Banded. - Mild colitis in cecum. - Diverticulosis in the sigmoid colon and in the descending colon.  Recs: - Await pathology results. - Use Prilosec (omeprazole) 20 mg PO BID for 3 months atleast. - Resume regular diet today. - Continue present medications. - Follow up in GI clinic in 2 weeks after discharge. - Avoid ASA 81mg  and NSAIDs - Start B12 injections as inpt and oral B12 1000mcg daily as outpt as he has B12 deficiency - Oral iron 325mg  daily - Patient willing to stay in hospital today, ok to transfer to floor   Arlyss Repressohini R Vanga, MD [address]  [address]  [place], [place] [address]  Main: [phone]  Fax: [phone] Pager: [phone]

## 2017-07-14 NOTE — Transfer of Care (Signed)
Immediate Anesthesia Transfer of Care Note  Patient: Mitchell MantleClarence R Price  Procedure(s) Performed: COLONOSCOPY (N/A ) ESOPHAGOGASTRODUODENOSCOPY (EGD) (N/A )  Patient Location: PACU  Anesthesia Type:General  Level of Consciousness: awake and oriented  Airway & Oxygen Therapy: Patient Spontanous Breathing and Patient connected to nasal cannula oxygen  Post-op Assessment: Report given to RN and Post -op Vital signs reviewed and stable  Post vital signs: Reviewed and stable  Last Vitals:  Vitals:   07/14/17 1200 07/14/17 1300  BP: (!) 99/56 (!) 142/88  Pulse: 79 96  Resp: 16 16  SpO2: 99% 100%    Last Pain:  Vitals:   07/13/17 2335  TempSrc: Oral         Complications: No apparent anesthesia complications

## 2017-07-14 NOTE — Op Note (Signed)
[facility] Gastroenterology Patient Name: Mitchell Price Procedure Date: 07/14/2017 2:24 PM MRN: [id number] Account #: [id number] Date of Birth: 08/27/1945 Admit Type: Outpatient Age: 72 Room: [facility] ENDO ROOM 1 Gender: Male Note Status: Finalized Procedure:            Upper GI endoscopy Indications:          Acute post hemorrhagic anemia, Hematochezia Providers:            Lin Landsman MD, MD Referring MD:         No Local Md, MD (Referring MD) Medicines:            Monitored Anesthesia Care Complications:        No immediate complications. Estimated blood loss:                        Minimal. Procedure:            Pre-Anesthesia Assessment:                       - Prior to the procedure, a History and Physical was                        performed, and patient medications and allergies were                        reviewed. The patient is competent. The risks and                        benefits of the procedure and the sedation options and                        risks were discussed with the patient. All questions                        were answered and informed consent was obtained.                        Patient identification and proposed procedure were                        verified by the physician, the nurse, the                        anesthesiologist, the anesthetist and the technician in                        the pre-procedure area in the procedure room. Mental                        Status Examination: alert and oriented. Airway                        Examination: normal oropharyngeal airway and neck                        mobility. Respiratory Examination: clear to                        auscultation. CV Examination: normal. Prophylactic  Antibiotics: The patient does not require prophylactic                        antibiotics. Prior Anticoagulants: The patient has                        taken aspirin, last dose was 1 day  prior to procedure.                        ASA Grade Assessment: II - A patient with mild systemic                        disease. After reviewing the risks and benefits, the                        patient was deemed in satisfactory condition to undergo                        the procedure. The anesthesia plan was to use monitored                        anesthesia care (MAC). Immediately prior to                        administration of medications, the patient was                        re-assessed for adequacy to receive sedatives. The                        heart rate, respiratory rate, oxygen saturations, blood                        pressure, adequacy of pulmonary ventilation, and                        response to care were monitored throughout the                        procedure. The physical status of the patient was                        re-assessed after the procedure.                       After obtaining informed consent, the endoscope was                        passed under direct vision. Throughout the procedure,                        the patient's blood pressure, pulse, and oxygen                        saturations were monitored continuously. The Endoscope                        was introduced through the mouth, and advanced to the  second part of duodenum. The upper GI endoscopy was                        accomplished without difficulty. The patient tolerated                        the procedure well. Findings:      The duodenal bulb and second portion of the duodenum were normal.      Diffuse moderate mucosal changes characterized by granularity, linear       erosions, nodularity and scarring (post ulcer) were found in the gastric       body. Biopsies were taken with a cold forceps for Helicobacter pylori       testing.      A single 5 mm mucosal nodule with a localized distribution was found at       the gastroesophageal junction. Biopsies were  taken with a cold forceps       for histology, there was linear erosion/MW tear underlying nodule      The examined esophagus was normal. Impression:           - Normal duodenal bulb and second portion of the                        duodenum.                       - Granular, linearly eroded, nodular and scarred (post                        ulcer) mucosa in the gastric body. Biopsied.                       - Mucosal nodule found in the esophagus, GE junction.                        Biopsied.                       - Probable MW tear at the GE junction Recommendation:       - Await pathology results.                       - Use Prilosec (omeprazole) 20 mg PO BID for 3 months. Procedure Code(s):    --- Professional ---                       [phone], Esophagogastroduodenoscopy, flexible, transoral;                        with biopsy, single or multiple Diagnosis Code(s):    --- Professional ---                       K31.89, Other diseases of stomach and duodenum                       K22.8, Other specified diseases of esophagus                       D62, Acute posthemorrhagic anemia  K92.1, Melena (includes Hematochezia) CPT copyright 2016 American Medical Association. All rights reserved. The codes documented in this report are preliminary and upon coder review may  be revised to meet current compliance requirements. Dr. Ulyess Mort Lin Landsman MD, MD 07/14/2017 3:33:59 PM This report has been signed electronically. Number of Addenda: 0 Note Initiated On: 07/14/2017 2:24 PM      [facility]

## 2017-07-14 NOTE — Progress Notes (Signed)
Patient ID: Mitchell Price, male   DOB: 23-Jun-1945, 72 y.o.   MRN: [id number] Pulmonary/critical care  Patient returns from endoscopy. Please see the following results per gastroenterology  EGD findings: - Normal duodenal bulb and second portion of the duodenum. - Granular, linearly eroded, nodular and scarred (post ulcer) mucosa in the gastric body. Biopsied. - Mucosal nodule found in the esophagus, GE junction. Biopsied. - Probable MW tear at the GE junction  Colonoscopy findings: - Preparation of the colon was poor - Non-thrombosed external hemorrhoids and non-thrombosed internal hemorrhoids found on perianal exam. - The examined portion of the ileum was normal. - Stool in the entire examined colon. - Non-bleeding internal hemorrhoids. Banded. - Mild colitis in cecum. - Diverticulosis in the sigmoid colon and in the descending colon.  Patient is insisting on signing out AMA. Counseled him again that this is a bad idea he was recently had multiple episodes of GI bleeding requiring transfusion. He insists on leaving. He understands that this may result in fatality. [facility]. Per gastroenterology recommended that he follow-up with his primary care doctor in the morning, recommended stool softener first hemorrhoids, continue his iron, B12 supplementation since his B12 level is very low along with oral proton pump inhibitor. These medications were written down on a piece paper for him from the [facility].  Tora KindredJohn Sevannah Madia, D.O.

## 2017-07-15 ENCOUNTER — Encounter: Payer: Self-pay | Admitting: Gastroenterology

## 2017-07-15 NOTE — Discharge Summary (Signed)
Physician Discharge Summary  Patient ID: Mitchell Price MRN: [id number] DOB/AGE: 1945-06-21 72 y.o.  Admit date: 07/12/2017 Discharge date: 07/15/2017  Admission Diagnoses: Gastrointestinal bleeding  Discharge Diagnoses:  Active Problems:   GIB (gastrointestinal bleeding)   Acute GI bleeding   Grade III hemorrhoids  Hospital Course:   72 year old African-American male with previous GI bleed who presented to the ED with complaints of bright red blood per rectum times 2 days.  Bleeding was associated with dizziness.  Symptoms got worse hence he decided to come to the ED.  He denies any hematemesis and abdominal pain.  He had similar symptoms before and was [facility].  He denies taking any blood thinners/aspirin.  He was admitted to the floor for management of acute lower GI bleed.  This morning patient had copious amounts of bloody stools with a significant drop in his hemoglobin from 10.5-7.0.  He also complained of nausea and had multiple episodes of  coffee-ground emesis.  He is complaining of dizziness but denies headache and abdominal pain. His blood pressor also dropped from 103/51 to 77/40. He is being transferred to the ICU for further management.  In the intensive care unit he required transfusion of blood, we see notes for full details.  He also had a bleeding scan which did not reveal any evidence of active bleeding.  He underwent endoscopy with results per GI listed as follows   EGD findings: - Normal duodenal bulb and second portion of the duodenum. - Granular, linearly eroded, nodular and scarred (post ulcer) mucosa in the gastric body. Biopsied. - Mucosal nodule found in the esophagus, GE junction. Biopsied. - Probable MW tear at the GE junction  Colonoscopy findings: - Preparation of the colon was poor - Non-thrombosed external hemorrhoids and non-thrombosed internal hemorrhoids found on perianal exam. - The examined portion of the ileum was normal. - Stool in the  entire examined colon. - Non-bleeding internal hemorrhoids. Banded. - Mild colitis in cecum. - Diverticulosis in the sigmoid colon and in the descending colon  Unfortunately patient was threatening to leave AMA throughout the entire hospitalization.  I was able to talk him into staying at least until he received endoscopy and his procedures for further evaluation of active bleeding.  Postprocedure he  signed out AGAINST MEDICAL ADVICE.  Both he and his wife were present when I explained that this was a bad decision and it could potentially result in catastrophe and fatality.  He understood and wished to leave anyway.  After reviewing GIs recommendations I did my best to at least recommend appropriate outpatient follow-up.  He will be calling his primary care provider in the morning.  Per GI recommended he stay on oral iron, stool softener, B12 supplementation along with omeprazole.  I advised him if he has any difficulties he should come into the emergency room as soon as possible.  He understood and was appreciative.   Consults: GI  Significant Diagnostic Studies: Patient had bleeding scan  Treatments:  upper and lower endoscopy  Disposition: L no T-cell area that was my day yesterday eft AGAINST MEDICAL ADVICE  Allergies as of 07/14/2017   No Known Allergies     Medication List    ASK your doctor about these medications   DIABETIC TUSSIN MAX ST 10-200 MG/5ML Liqd Generic drug:  Dextromethorphan-Guaifenesin Take 10 mLs by mouth every 4 (four) hours as needed.   docusate sodium 100 MG capsule Commonly known as:  COLACE Take 100 mg by mouth 2 (two)  times daily as needed for mild constipation.   ferrous sulfate 325 (65 FE) MG EC tablet Take 325 mg by mouth daily with breakfast.   glimepiride 1 MG tablet Commonly known as:  AMARYL Take 1 mg by mouth every morning.   INVOKANA 300 MG Tabs tablet Generic drug:  canagliflozin Take 300 mg by mouth every morning.   lisinopril 10 MG  tablet Commonly known as:  PRINIVIL,ZESTRIL Take 10 mg by mouth daily.   metFORMIN 1000 MG tablet Commonly known as:  GLUCOPHAGE Take 1,000 mg by mouth 2 (two) times daily.   pioglitazone 30 MG tablet Commonly known as:  ACTOS Take 30 mg by mouth daily.   tamsulosin 0.4 MG Caps capsule Commonly known as:  FLOMAX Take 0.4 mg by mouth daily.        Signed: Latroy Price 07/15/2017, 12:33 PM

## 2017-07-16 ENCOUNTER — Inpatient Hospital Stay
Admission: EM | Admit: 2017-07-16 | Discharge: 2017-07-17 | DRG: 378 | Discharge status: Against Medical Advice | Payer: Medicare Other | Attending: Family Medicine | Admitting: Family Medicine

## 2017-07-16 ENCOUNTER — Other Ambulatory Visit: Payer: Self-pay

## 2017-07-16 ENCOUNTER — Inpatient Hospital Stay: Payer: Medicare Other

## 2017-07-16 DIAGNOSIS — K922 Gastrointestinal hemorrhage, unspecified: Secondary | ICD-10-CM | POA: Diagnosis not present

## 2017-07-16 DIAGNOSIS — I10 Essential (primary) hypertension: Secondary | ICD-10-CM | POA: Diagnosis present

## 2017-07-16 DIAGNOSIS — K648 Other hemorrhoids: Secondary | ICD-10-CM | POA: Diagnosis present

## 2017-07-16 DIAGNOSIS — F17200 Nicotine dependence, unspecified, uncomplicated: Secondary | ICD-10-CM | POA: Diagnosis not present

## 2017-07-16 DIAGNOSIS — E042 Nontoxic multinodular goiter: Secondary | ICD-10-CM | POA: Diagnosis present

## 2017-07-16 DIAGNOSIS — R42 Dizziness and giddiness: Secondary | ICD-10-CM | POA: Diagnosis present

## 2017-07-16 DIAGNOSIS — IMO0002 Reserved for concepts with insufficient information to code with codable children: Secondary | ICD-10-CM

## 2017-07-16 DIAGNOSIS — E119 Type 2 diabetes mellitus without complications: Secondary | ICD-10-CM | POA: Diagnosis present

## 2017-07-16 DIAGNOSIS — K5730 Diverticulosis of large intestine without perforation or abscess without bleeding: Secondary | ICD-10-CM | POA: Diagnosis present

## 2017-07-16 DIAGNOSIS — D62 Acute posthemorrhagic anemia: Secondary | ICD-10-CM | POA: Diagnosis present

## 2017-07-16 DIAGNOSIS — R229 Localized swelling, mass and lump, unspecified: Secondary | ICD-10-CM

## 2017-07-16 DIAGNOSIS — Z79899 Other long term (current) drug therapy: Secondary | ICD-10-CM | POA: Diagnosis not present

## 2017-07-16 DIAGNOSIS — D500 Iron deficiency anemia secondary to blood loss (chronic): Secondary | ICD-10-CM | POA: Diagnosis present

## 2017-07-16 DIAGNOSIS — K625 Hemorrhage of anus and rectum: Secondary | ICD-10-CM | POA: Diagnosis present

## 2017-07-16 DIAGNOSIS — Z7984 Long term (current) use of oral hypoglycemic drugs: Secondary | ICD-10-CM

## 2017-07-16 DIAGNOSIS — N400 Enlarged prostate without lower urinary tract symptoms: Secondary | ICD-10-CM | POA: Diagnosis present

## 2017-07-16 DIAGNOSIS — D591 Other autoimmune hemolytic anemias: Secondary | ICD-10-CM | POA: Diagnosis not present

## 2017-07-16 LAB — TROPONIN I

## 2017-07-16 LAB — COMPREHENSIVE METABOLIC PANEL
ALK PHOS: 45 U/L (ref 38–126)
ALT: 10 U/L — ABNORMAL LOW (ref 17–63)
AST: 19 U/L (ref 15–41)
Albumin: 3.3 g/dL — ABNORMAL LOW (ref 3.5–5.0)
Anion gap: 9 (ref 5–15)
BUN: 14 mg/dL (ref 6–20)
CALCIUM: 8.6 mg/dL — AB (ref 8.9–10.3)
CO2: 21 mmol/L — ABNORMAL LOW (ref 22–32)
Chloride: 113 mmol/L — ABNORMAL HIGH (ref 101–111)
Creatinine, Ser: 1.28 mg/dL — ABNORMAL HIGH (ref 0.61–1.24)
GFR, EST NON AFRICAN AMERICAN: 54 mL/min — AB (ref >60)
Glucose, Bld: 200 mg/dL — ABNORMAL HIGH (ref 65–99)
Potassium: 3.4 mmol/L — ABNORMAL LOW (ref 3.5–5.1)
Sodium: 143 mmol/L (ref 135–145)
TOTAL PROTEIN: 5.8 g/dL — AB (ref 6.5–8.1)
Total Bilirubin: 0.7 mg/dL (ref 0.3–1.2)

## 2017-07-16 LAB — HEMOGLOBIN AND HEMATOCRIT, BLOOD
HCT: 25.2 % — ABNORMAL LOW (ref 40.0–52.0)
Hemoglobin: 8.6 g/dL — ABNORMAL LOW (ref 13.0–18.0)

## 2017-07-16 LAB — SURGICAL PATHOLOGY

## 2017-07-16 LAB — CBC
HCT: 20.5 % — ABNORMAL LOW (ref 40.0–52.0)
Hemoglobin: 7 g/dL — ABNORMAL LOW (ref 13.0–18.0)
MCH: 32.9 pg (ref 26.0–34.0)
MCHC: 33.9 g/dL (ref 32.0–36.0)
MCV: 96.9 fL (ref 80.0–100.0)
PLATELETS: 174 10*3/uL (ref 150–440)
RBC: 2.12 MIL/uL — AB (ref 4.40–5.90)
RDW: 16.8 % — ABNORMAL HIGH (ref 11.5–14.5)
WBC: 9.1 10*3/uL (ref 3.8–10.6)

## 2017-07-16 LAB — LIPASE, BLOOD: LIPASE: 21 U/L (ref 11–51)

## 2017-07-16 LAB — PREPARE RBC (CROSSMATCH)

## 2017-07-16 LAB — TSH: TSH: 0.572 u[IU]/mL (ref 0.350–4.500)

## 2017-07-16 MED ORDER — ATORVASTATIN CALCIUM 20 MG PO TABS: 40.0000 mg | ORAL_TABLET | Freq: Every day | ORAL | Status: DC

## 2017-07-16 MED ORDER — SODIUM CHLORIDE 0.9 % IV SOLN
200.0000 mg | Freq: Once | INTRAVENOUS | Status: AC
  Administered 2017-07-16: 200 mg via INTRAVENOUS
  Filled 2017-07-16: qty 10

## 2017-07-16 MED ORDER — ACETAMINOPHEN 650 MG RE SUPP: 650.0000 mg | Freq: Four times a day (QID) | RECTAL | Status: DC | PRN

## 2017-07-16 MED ORDER — SODIUM CHLORIDE 0.9 % IV SOLN
10.0000 mL/h | Freq: Once | INTRAVENOUS | Status: AC
  Administered 2017-07-16: 10 mL/h via INTRAVENOUS

## 2017-07-16 MED ORDER — ONDANSETRON HCL 4 MG/2ML IJ SOLN: 4.0000 mg | Freq: Four times a day (QID) | INTRAMUSCULAR | Status: DC | PRN

## 2017-07-16 MED ORDER — SODIUM CHLORIDE 0.9 % IV SOLN
200.0000 mg | Freq: Once | INTRAVENOUS | Status: DC
  Filled 2017-07-16: qty 10

## 2017-07-16 MED ORDER — PANTOPRAZOLE SODIUM 40 MG IV SOLR
8.0000 mg/h | INTRAVENOUS | Status: DC
  Administered 2017-07-16 – 2017-07-17 (×2): 8 mg/h via INTRAVENOUS
  Filled 2017-07-16 (×2): qty 80

## 2017-07-16 MED ORDER — VITAMIN B-12 1000 MCG PO TABS: 1000.0000 ug | ORAL_TABLET | Freq: Every day | ORAL | Status: DC

## 2017-07-16 MED ORDER — PANTOPRAZOLE SODIUM 40 MG IV SOLR: 40.0000 mg | Freq: Two times a day (BID) | INTRAVENOUS | Status: DC

## 2017-07-16 MED ORDER — TAMSULOSIN HCL 0.4 MG PO CAPS: 0.4000 mg | ORAL_CAPSULE | Freq: Every day | ORAL | Status: DC

## 2017-07-16 MED ORDER — SODIUM CHLORIDE 0.9 % IV SOLN
500.0000 mg | Freq: Once | INTRAVENOUS | Status: DC
  Filled 2017-07-16: qty 10

## 2017-07-16 MED ORDER — SODIUM CHLORIDE 0.9 % IV SOLN
80.0000 mg | Freq: Once | INTRAVENOUS | Status: AC
  Administered 2017-07-16: 19:00:00 80 mg via INTRAVENOUS
  Filled 2017-07-16: qty 80

## 2017-07-16 MED ORDER — CYANOCOBALAMIN 1000 MCG/ML IJ SOLN
1000.0000 ug | Freq: Once | INTRAMUSCULAR | Status: AC
  Administered 2017-07-16: 1000 ug via INTRAMUSCULAR
  Filled 2017-07-16: qty 1

## 2017-07-16 MED ORDER — HYDROCODONE-ACETAMINOPHEN 5-325 MG PO TABS: 1.0000 | ORAL_TABLET | ORAL | Status: DC | PRN

## 2017-07-16 MED ORDER — DOCUSATE SODIUM 100 MG PO CAPS: 100.0000 mg | ORAL_CAPSULE | Freq: Two times a day (BID) | ORAL | Status: DC | PRN

## 2017-07-16 MED ORDER — ONDANSETRON HCL 4 MG PO TABS: 4.0000 mg | ORAL_TABLET | Freq: Four times a day (QID) | ORAL | Status: DC | PRN

## 2017-07-16 MED ORDER — DOCUSATE SODIUM 100 MG PO CAPS
100.0000 mg | ORAL_CAPSULE | Freq: Two times a day (BID) | ORAL | Status: DC
  Administered 2017-07-16: 100 mg via ORAL
  Filled 2017-07-16: qty 1

## 2017-07-16 MED ORDER — GLIMEPIRIDE 1 MG PO TABS
1.0000 mg | ORAL_TABLET | ORAL | Status: DC
  Filled 2017-07-16: qty 1

## 2017-07-16 MED ORDER — FERROUS SULFATE 325 (65 FE) MG PO TABS
325.0000 mg | ORAL_TABLET | Freq: Two times a day (BID) | ORAL | Status: DC
  Filled 2017-07-16 (×2): qty 1

## 2017-07-16 MED ORDER — ADULT MULTIVITAMIN W/MINERALS CH
1.0000 | ORAL_TABLET | Freq: Every day | ORAL | Status: DC
  Administered 2017-07-16: 1 via ORAL
  Filled 2017-07-16: qty 1

## 2017-07-16 MED ORDER — LISINOPRIL 10 MG PO TABS
10.0000 mg | ORAL_TABLET | Freq: Every day | ORAL | Status: DC
  Filled 2017-07-16: qty 1

## 2017-07-16 MED ORDER — ACETAMINOPHEN 325 MG PO TABS: 650.0000 mg | ORAL_TABLET | Freq: Four times a day (QID) | ORAL | Status: DC | PRN

## 2017-07-16 MED ORDER — POLYETHYLENE GLYCOL 3350 17 G PO PACK: 17.0000 g | PACK | Freq: Every day | ORAL | Status: DC | PRN

## 2017-07-16 MED ORDER — NICOTINE 14 MG/24HR TD PT24
14.0000 mg | MEDICATED_PATCH | Freq: Every day | TRANSDERMAL | Status: DC
  Filled 2017-07-16: qty 1

## 2017-07-16 MED ORDER — SODIUM CHLORIDE 0.9 % IV SOLN: Freq: Once | INTRAVENOUS | Status: DC

## 2017-07-16 NOTE — ED Triage Notes (Signed)
FN: pt presents with rectal bleeding, was just  Here this week for same.

## 2017-07-16 NOTE — Progress Notes (Signed)
Prime doc notified of pt's refusal of VS and Blood draw.

## 2017-07-16 NOTE — ED Notes (Signed)
Gerilyn PilgrimJacob EDT informed this RN that pt was refusing to be transported to the floor stating that no one had spoke with him about being admitted. This RN went in room to speak with patient and explain to him why he was being admitted and that the admitting doctor had been in and spoke with him. When RN entered the room pt stated "who is you" this RN informed the patient that I was had been one of the nurses working with him today, pt stated "I don't know if you have been working with me or working under me". This RN explained to pt that we were trying to find out why he was losing blood, pt stated that "the black man who came in here told me they were going to be looking at my throat". This RN explained to pt that he was going to be evaluated but admitted to further work up his blood lose, pt stated "do whatever the fuck you need to". RN verbalized to pt that there was no need to curse that we were just trying to help him figure out why he was losing blood. Pt stated "well take me up there then.  Gerilyn PilgrimJacob EDT called back to room and pt was transported off the floor.
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
## 2017-07-16 NOTE — Progress Notes (Signed)
Pt instructed on importance of having vital signs and having blood drawn. Instructed if blood not drawn and Hgb level not checked hgb maybe low and the  potential for the patient to die.

## 2017-07-16 NOTE — ED Triage Notes (Signed)
Pt came to ED via pov c/o rectal bleeding, was seen here and admitted for 2 days. Pt reports has not gotten worse, just continuing to have bleeding. Denies pain.
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
## 2017-07-16 NOTE — Progress Notes (Signed)
Chaplain met with patient and family and provided emotional support.

## 2017-07-16 NOTE — Progress Notes (Signed)
Patient arrived to floor stating he does not intend to stay the night.  He states the bleeding has stopped and he wouldn't have come in if he knew they wanted to admit him.  Further he states, "I made it clear when I was downstairs that I wasn't going to stay overnight".  Attempted to explain to patient the seriousness of blood loss and potential health consequences of leaving AMA.  Patient upset because he feels like he won't be allowed to eat, however reassured patient that he has an active diet at this time.

## 2017-07-17 ENCOUNTER — Other Ambulatory Visit: Payer: Self-pay

## 2017-07-17 ENCOUNTER — Emergency Department
Admission: EM | Admit: 2017-07-17 | Discharge: 2017-07-17 | Disposition: A | Discharge status: Home / Self Care | Payer: Medicare Other | Attending: Emergency Medicine | Admitting: Emergency Medicine

## 2017-07-17 ENCOUNTER — Encounter: Payer: Self-pay | Admitting: Emergency Medicine

## 2017-07-17 DIAGNOSIS — K922 Gastrointestinal hemorrhage, unspecified: Secondary | ICD-10-CM

## 2017-07-17 DIAGNOSIS — D591 Other autoimmune hemolytic anemias: Secondary | ICD-10-CM | POA: Diagnosis not present

## 2017-07-17 DIAGNOSIS — F17200 Nicotine dependence, unspecified, uncomplicated: Secondary | ICD-10-CM | POA: Insufficient documentation

## 2017-07-17 DIAGNOSIS — Z79899 Other long term (current) drug therapy: Secondary | ICD-10-CM | POA: Insufficient documentation

## 2017-07-17 DIAGNOSIS — Z7984 Long term (current) use of oral hypoglycemic drugs: Secondary | ICD-10-CM | POA: Insufficient documentation

## 2017-07-17 DIAGNOSIS — D649 Anemia, unspecified: Secondary | ICD-10-CM

## 2017-07-17 DIAGNOSIS — E119 Type 2 diabetes mellitus without complications: Secondary | ICD-10-CM | POA: Insufficient documentation

## 2017-07-17 LAB — BASIC METABOLIC PANEL
ANION GAP: 11 (ref 5–15)
BUN: 16 mg/dL (ref 6–20)
CALCIUM: 8.6 mg/dL — AB (ref 8.9–10.3)
CO2: 20 mmol/L — ABNORMAL LOW (ref 22–32)
CREATININE: 1.2 mg/dL (ref 0.61–1.24)
Chloride: 110 mmol/L (ref 101–111)
GFR, EST NON AFRICAN AMERICAN: 58 mL/min — AB (ref >60)
Glucose, Bld: 142 mg/dL — ABNORMAL HIGH (ref 65–99)
Potassium: 3.7 mmol/L (ref 3.5–5.1)
Sodium: 141 mmol/L (ref 135–145)

## 2017-07-17 LAB — CBC
HCT: 22.3 % — ABNORMAL LOW (ref 40.0–52.0)
HEMOGLOBIN: 7.4 g/dL — AB (ref 13.0–18.0)
MCH: 31.1 pg (ref 26.0–34.0)
MCHC: 33.2 g/dL (ref 32.0–36.0)
MCV: 93.9 fL (ref 80.0–100.0)
PLATELETS: 184 10*3/uL (ref 150–440)
RBC: 2.37 MIL/uL — AB (ref 4.40–5.90)
RDW: 20.1 % — ABNORMAL HIGH (ref 11.5–14.5)
WBC: 8.2 10*3/uL (ref 3.8–10.6)

## 2017-07-17 LAB — PREPARE RBC (CROSSMATCH)

## 2017-07-17 LAB — HEMOGLOBIN AND HEMATOCRIT, BLOOD
HEMATOCRIT: 21.5 % — AB (ref 40.0–52.0)
HEMOGLOBIN: 7.2 g/dL — AB (ref 13.0–18.0)

## 2017-07-17 MED ORDER — SODIUM CHLORIDE 0.9 % IV SOLN
10.0000 mL/h | Freq: Once | INTRAVENOUS | Status: AC
  Administered 2017-07-17: 10 mL/h via INTRAVENOUS

## 2017-07-17 NOTE — Discharge Summary (Signed)
[facility] - [place] [facility]   PATIENT NAME: Mitchell Price    MR#:  [id number]  DATE OF BIRTH:  07-31-1945  DATE OF ADMISSION:  07/16/2017 ADMITTING PHYSICIAN: Bertrum Sol, MD  DATE OF DISCHARGE: 07/17/2017  9:29 AM  PRIMARY CARE PHYSICIAN: Patient, No Pcp Per    ADMISSION DIAGNOSIS:  Acute blood loss anemia [D62] Mass [R22.9] Rectal bleeding [K62.5]  DISCHARGE DIAGNOSIS:  Active Problems:   GIB (gastrointestinal bleeding)   SECONDARY DIAGNOSIS:   Past Medical History:  Diagnosis Date  . Diabetes mellitus without complication (HCC)   . H/O: upper GI bleed    2 years ago    HOSPITAL COURSE:  Patient left hospital AGAINST MEDICAL ADVICE  A/p prior to discharge: 1 acute recurrent GI bleeding Left the hospital on yesterday AMA  for same  Status post EEG noted for possible Mallory-Weiss tear at the GE junction, colonoscopy noted for poor prep/internal and external hemorrhoids which were banded/diverticular disease/colitis in the cecum Admit to regular nursing floor bed, patient status post blood transfusion in the emergency room, H&H every 6 hours, CBC daily, type and cross 2 units to hold, transfuse as needed, consult gastroenterology for expert opinion, avoid antiplatelet/anticoagulants/NSAID medications, Protonix drip  2 acute on chronic blood loss/vitamin B12/iron deficiency anemia Blood transfusion per above, B12 injection times 06/1998 mg daily, IV iron x1, ferrous sulfate twice daily with vitamin C, and all other plans as stated above  3 chronic diabetes mellitus type 2 Patient refuses insulin Continue home regiment with exception of metformin while in house   4 acute large neck mass  Most likely goiter versus fatty tumor  Check TSH and thyroid ultrasound   5 chronic tobacco smoking/abuse/dependency nicotine patch and cessation counseling ordered   6 chronic benign essential hypertension  Stable  Continue home  regiment  DISCHARGE CONDITIONS:  Patient left hospital AGAINST MEDICAL ADVICE  CONSULTS OBTAINED:  Treatment Team:  Stanton Kidney, MD Raequan Vanschaick, Evelena Asa, MD Ancil Linsey, MD  DRUG ALLERGIES:  No Known Allergies  DISCHARGE MEDICATIONS:   Allergies as of 07/17/2017   No Known Allergies     Medication List    ASK your doctor about these medications   atorvastatin 40 MG tablet Commonly known as:  LIPITOR Take 40 mg by mouth daily.   docusate sodium 100 MG capsule Commonly known as:  COLACE Take 100 mg by mouth 2 (two) times daily as needed for mild constipation.   ferrous sulfate 325 (65 FE) MG EC tablet Take 325 mg by mouth daily with breakfast.   glimepiride 1 MG tablet Commonly known as:  AMARYL Take 1 mg by mouth every morning.   INVOKANA 300 MG Tabs tablet Generic drug:  canagliflozin Take 300 mg by mouth every morning.   lisinopril 10 MG tablet Commonly known as:  PRINIVIL,ZESTRIL Take 10 mg by mouth daily.   metFORMIN 1000 MG tablet Commonly known as:  GLUCOPHAGE Take 1,000 mg by mouth 2 (two) times daily.   tamsulosin 0.4 MG Caps capsule Commonly known as:  FLOMAX Take 0.4 mg by mouth daily.        DISCHARGE INSTRUCTIONS:  If you experience worsening of your admission symptoms, develop shortness of breath, life threatening emergency, suicidal or homicidal thoughts you must seek medical attention immediately by calling 911 or calling your MD immediately  if symptoms less severe.  You Must read complete instructions/literature along with all the possible adverse reactions/side effects for all the Medicines  you take and that have been prescribed to you. Take any new Medicines after you have completely understood and accept all the possible adverse reactions/side effects.   Please note  You were cared for by a hospitalist during your hospital stay. If you have any questions about your discharge medications or the care you received while you were  in the hospital after you are discharged, you can call the unit and asked to speak with the hospitalist on call if the hospitalist that took care of you is not available. Once you are discharged, your primary care physician will handle any further medical issues. Please note that NO REFILLS for any discharge medications will be authorized once you are discharged, as it is imperative that you return to your primary care physician (or establish a relationship with a primary care physician if you do not have one) for your aftercare needs so that they can reassess your need for medications and monitor your lab values.    Today   CHIEF COMPLAINT:   Chief Complaint  Patient presents with  . Rectal Bleeding    HISTORY OF PRESENT ILLNESS:  72 y.o. male with a known history of B12 deficiency, iron deficiency anemia, diabetes mellitus type 2, hypertension, BPH, chronic tobacco smoking, left hospital on yesterday for acute GI bleeding AGAINST MEDICAL ADVICE-workup at that time noted for EGD with Mallory-Weiss tear possibly at GE junction, colonoscopy noted for poor prep/internal and external hemorrhoids status post banding/diverticula noted, patient returns today with lower intestinal bright red blood per rectum, complains of generalized weakness/fatigue, hemoglobin was 7 down from 8.2, potassium 3.4, multiple family members at the bedside, patient states that he is not interested in taking any insulin for his diabetes, patient is now been admitted for acute recurrent lower GI bleeding. VITAL SIGNS:  Blood pressure 124/67, pulse 90, temperature 98.4 F (36.9 C), temperature source Oral, resp. rate 20, height 6\' 5"  (1.956 m), weight 78.5 kg (173 lb), SpO2 100 %.  I/O:    Intake/Output Summary (Last 24 hours) at 07/17/2017 1500 Last data filed at 07/17/2017 0407 Gross per 24 hour  Intake 457 ml  Output 0 ml  Net 457 ml    PHYSICAL EXAMINATION:  GENERAL:  72 y.o.-year-old patient lying in the bed with  no acute distress.  EYES: Pupils equal, round, reactive to light and accommodation. No scleral icterus. Extraocular muscles intact.  HEENT: Head atraumatic, normocephalic. Oropharynx and nasopharynx clear.  NECK:  Supple, no jugular venous distention. No thyroid enlargement, no tenderness.  LUNGS: Normal breath sounds bilaterally, no wheezing, rales,rhonchi or crepitation. No use of accessory muscles of respiration.  CARDIOVASCULAR: S1, S2 normal. No murmurs, rubs, or gallops.  ABDOMEN: Soft, non-tender, non-distended. Bowel sounds present. No organomegaly or mass.  EXTREMITIES: No pedal edema, cyanosis, or clubbing.  NEUROLOGIC: Cranial nerves II through XII are intact. Muscle strength 5/5 in all extremities. Sensation intact. Gait not checked.  PSYCHIATRIC: The patient is alert and oriented x 3.  SKIN: No obvious rash, lesion, or ulcer.   DATA REVIEW:   CBC Recent Labs  Lab 07/17/17 0440  WBC 8.2  HGB 7.4*  HCT 22.3*  PLT 184    Chemistries  Recent Labs  Lab 07/16/17 0848 07/17/17 0440  NA 143 141  K 3.4* 3.7  CL 113* 110  CO2 21* 20*  GLUCOSE 200* 142*  BUN 14 16  CREATININE 1.28* 1.20  CALCIUM 8.6* 8.6*  AST 19  --   ALT 10*  --  ALKPHOS 45  --   BILITOT 0.7  --     Cardiac Enzymes Recent Labs  Lab 07/16/17 0848  TROPONINI <0.03    Microbiology Results  No results found for this or any previous visit.  RADIOLOGY:  US Thyroid  Result Date: 07/16/2017 CLINICAL DATA:  Neck mass.  Evaluate for goiter. EXAM: THYROID ULTRASOUND TECHNIQUE: Ultrasound examination of the thyroid gland and adjacent soft tissues was performed. COMPARISON:  None. FINDINGS: Parenchymal Echotexture: Markedly heterogenous Isthmus: 0.7 cm Right lobe: 8.8 x 4.1 x 3.6 cm Left lobe: 9.9 x 4.4 x 4.6 cm _________________________________________________________ Estimated total number of nodules >/= 1 cm: 6-10 Number of spongiform nodules >/=  2 cm not described below (TR1): 0 Number of mixed  cystic and solid nodules >/= 1.5 cm not described below (TR2): 0 _________________________________________________________ Nodule # 1: Location: Right; Mid Maximum size: 2.5 cm; Other 2 dimensions: 1.8 x 2.3 cm Composition: solid/almost completely solid (2) Echogenicity: isoechoic (1) Shape: not taller-than-wide (0) Margins: ill-defined (0) Echogenic foci: none (0) ACR TI-RADS total points: 3. ACR TI-RADS risk category: TR3 (3 points). ACR TI-RADS recommendations: **Given size (>/= 2.5 cm) and appearance, fine needle aspiration of this mildly suspicious nodule should be considered based on TI-RADS criteria. _________________________________________________________ Mixed solid and cystic nodule in superior right thyroid lobe measuring up to 1.1 cm. Large cystic nodule in the inferior right thyroid lobe measures 2.4 x 2.1 x 2.6 cm. Nodule # 2: Location: Left; Mid Maximum size: 5.0 cm; Other 2 dimensions: 2.6 x 3.7 cm Composition: solid/almost completely solid (2) Echogenicity: isoechoic (1) Shape: not taller-than-wide (0) Margins: ill-defined (0) Echogenic foci: none (0) ACR TI-RADS total points: 3. ACR TI-RADS risk category: TR3 (3 points). ACR TI-RADS recommendations: **Given size (>/= 2.5 cm) and appearance, fine needle aspiration of this mildly suspicious nodule should be considered based on TI-RADS criteria. _________________________________________________________ Nodule # 3: Location: Left; Mid Maximum size: 3.8 cm; Other 2 dimensions: 1.8 x 3.7 cm Composition: solid/almost completely solid (2) Echogenicity: isoechoic (1) Shape: not taller-than-wide (0) Margins: ill-defined (0) Echogenic foci: none (0) ACR TI-RADS total points: 3. ACR TI-RADS risk category: TR3 (3 points). ACR TI-RADS recommendations: **Given size (>/= 2.5 cm) and appearance, fine needle aspiration of this mildly suspicious nodule should be considered based on TI-RADS criteria. _________________________________________________________ Nodule #  4: Location: Left; Inferior Maximum size: 1.4 cm; Other 2 dimensions: 1.1 x 1.5 cm Composition: cannot determine (2) Echogenicity: cannot determine (1) Shape: not taller-than-wide (0) Margins: ill-defined (0) Echogenic foci: peripheral calcifications (2) ACR TI-RADS total points: 5. ACR TI-RADS risk category: TR4 (4-6 points). ACR TI-RADS recommendations: **Given size (>/= 1.5 cm) and appearance, fine needle aspiration of this moderately suspicious nodule should be considered based on TI-RADS criteria. _________________________________________________________ IMPRESSION: Multinodular goiter. Multiple nodules meet criteria for biopsy. Based on TI-RADS recommendations (the 2 most suspicious nodules should be biopsied first), nodule #2 (mid left thyroid lobe) and nodule #4 (inferior left thyroid lobe) should be considered for biopsy. Recommend 1 year follow-up of the other nodules. The above is in keeping with the ACR TI-RADS recommendations - J Am Coll Radiol 2017;14:587-595. Electronically Signed   By: Richarda Overlie M.D.   On: 07/16/2017 16:37    EKG:   Orders placed or performed during the hospital encounter of 07/16/17  . ED EKG  . ED EKG  . EKG 12-Lead  . EKG 12-Lead      Management plans discussed with the patient, family and they are in agreement.  CODE STATUS:  Code Status History    Date Active Date Inactive Code Status Order ID Comments User Context   07/16/2017 16:12 07/17/2017 09:43 Full Code [id number]  Bertrum Sol, MD Inpatient   07/12/2017 21:56 07/14/2017 20:10 Full Code [id number]  Adrian Saran, MD Inpatient      TOTAL TIME TAKING CARE OF THIS PATIENT: 35 minutes.    Evelena Asa Johanan Skorupski M.D on 07/17/2017 at 3:00 PM  Between 7am to 6pm - Pager - [phone]  After 6pm go to [email or web address] - password [email or web address]  [facility]  Office  [phone]  CC: Primary care physician; Patient, No Pcp Per   Note: This dictation was prepared with Dragon dictation along with  smaller phrase technology. Any transcriptional errors that result from this process are unintentional.

## 2017-07-17 NOTE — ED Triage Notes (Addendum)
Pt to ed with c/o dizziness today.  Pt states he was admitted inpatient signed out AMA this morning.  Pt states he left the hospital and went across the street to see his boss felt dizzy and then returned to ed.

## 2017-07-17 NOTE — Progress Notes (Addendum)
Pt refused VS. Dr Sheryle Hailiamond notified.

## 2017-07-17 NOTE — ED Notes (Signed)
Pt stable; alert; ambulatory without assistance. Pt has stable VS at this time

## 2017-07-17 NOTE — Progress Notes (Signed)
Patient has refused 0800 medication stating that he was not taking any more medicine, is refusing further treatment and wanting to leave against medical advice. Per report from night shift RN, the patient has been refusing medications, vital signs and lab draws. This RN attempted to encourage patient to stay and receive his treatment. Patient stated "I'm going and you can't make me stay," and also stated that he had a bowel movement this morning and there was not bleeding. Patient's wife was at the bedside and she was asked if to encourage him to stay. She declined stating "I'm not getting cussed out." Press photographerCharge nurse, nursing supervisor and Dr. Katheren ShamsSalary was notified. Patient was made aware that he would need to sign a Leaving Hospital Against Medical Advice form. Patient stated "I know that, that's what I did the other day." Form was signed. Patient was already dressed in his own clothing.

## 2017-07-17 NOTE — Progress Notes (Signed)
Dr Anne HahnWillis notified hgb dropped to 7.2 from 8.6. I had discussed with patient that his hgb had dropped and that the doctor may order blood. Pt stated he was not going to get anymore blood. It was explained to patient he could die if his hgb drops more. Pt stated"if it was his time it was his time" States he is not bleeding. We could draw blood at 0500 and that was it.

## 2017-07-17 NOTE — Discharge Instructions (Signed)
You were treated in the emergency department for drop in hemoglobin associated with the known internal hemorrhoids and given blood transfusion here in the emergency department.  Return to emergency department immediately for any abdominal pain, chest pain, trouble breathing or shortness of breath, black or bloody stool, dizziness or passing out, or any other symptoms concerning to you.

## 2017-07-17 NOTE — ED Notes (Signed)
Pt has not complaints and shows no signs of reaction. Paulette RN in room as well.
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
## 2017-07-17 NOTE — ED Notes (Signed)
Pt presents for dizziness. Pt was at work when boss sent him here.

## 2017-07-18 DIAGNOSIS — E042 Nontoxic multinodular goiter: Secondary | ICD-10-CM | POA: Insufficient documentation

## 2017-07-18 LAB — TYPE AND SCREEN
ABO/RH(D): O POS
ANTIBODY SCREEN: NEGATIVE
UNIT DIVISION: 0
UNIT DIVISION: 0
Unit division: 0
Unit division: 0
Unit division: 0

## 2017-07-18 LAB — BPAM RBC
BLOOD PRODUCT EXPIRATION DATE: 201903112359
Blood Product Expiration Date: 201903112359
Blood Product Expiration Date: 201903112359
Blood Product Expiration Date: 201903112359
Blood Product Expiration Date: 201903112359
ISSUE DATE / TIME: 201902081106
ISSUE DATE / TIME: 201902091349
UNIT TYPE AND RH: 5100
UNIT TYPE AND RH: 5100
UNIT TYPE AND RH: 5100
UNIT TYPE AND RH: 5100
Unit Type and Rh: 5100

## 2017-07-18 LAB — PREPARE RBC (CROSSMATCH)

## 2017-07-19 ENCOUNTER — Encounter: Payer: Self-pay | Admitting: Emergency Medicine

## 2017-07-19 ENCOUNTER — Other Ambulatory Visit: Payer: Self-pay

## 2017-07-19 DIAGNOSIS — E119 Type 2 diabetes mellitus without complications: Secondary | ICD-10-CM | POA: Diagnosis not present

## 2017-07-19 DIAGNOSIS — K922 Gastrointestinal hemorrhage, unspecified: Secondary | ICD-10-CM | POA: Diagnosis present

## 2017-07-19 DIAGNOSIS — F17200 Nicotine dependence, unspecified, uncomplicated: Secondary | ICD-10-CM | POA: Diagnosis not present

## 2017-07-19 DIAGNOSIS — Z79899 Other long term (current) drug therapy: Secondary | ICD-10-CM | POA: Insufficient documentation

## 2017-07-19 DIAGNOSIS — Z7984 Long term (current) use of oral hypoglycemic drugs: Secondary | ICD-10-CM | POA: Insufficient documentation

## 2017-07-19 NOTE — ED Triage Notes (Addendum)
Patient ambulatory to triage with steady gait, without difficulty or distress noted; pt reports rectal bleeding since am; seen for same recently "but didn't tell me nothing"; seen PCP yesterday; st was admitted into hosp for transfusion; pt denies any pain

## 2017-07-20 ENCOUNTER — Emergency Department
Admission: EM | Admit: 2017-07-20 | Discharge: 2017-07-20 | Disposition: A | Discharge status: Home / Self Care | Payer: Medicare Other | Attending: Emergency Medicine | Admitting: Emergency Medicine

## 2017-07-20 DIAGNOSIS — K922 Gastrointestinal hemorrhage, unspecified: Secondary | ICD-10-CM

## 2017-07-20 LAB — CBC WITH DIFFERENTIAL/PLATELET
BASOS ABS: 0 10*3/uL (ref 0–0.1)
BASOS ABS: 0 10*3/uL (ref 0–0.1)
BASOS PCT: 0 %
BASOS PCT: 0 %
Basophils Absolute: 0 10*3/uL (ref 0–0.1)
Basophils Relative: 0 %
EOS ABS: 0.2 10*3/uL (ref 0–0.7)
EOS ABS: 0.3 10*3/uL (ref 0–0.7)
EOS PCT: 3 %
EOS PCT: 4 %
Eosinophils Absolute: 0.3 10*3/uL (ref 0–0.7)
Eosinophils Relative: 4 %
HCT: 23.1 % — ABNORMAL LOW (ref 40.0–52.0)
HCT: 25.3 % — ABNORMAL LOW (ref 40.0–52.0)
HCT: 27.8 % — ABNORMAL LOW (ref 40.0–52.0)
HEMOGLOBIN: 7.6 g/dL — AB (ref 13.0–18.0)
HEMOGLOBIN: 8.4 g/dL — AB (ref 13.0–18.0)
Hemoglobin: 9.4 g/dL — ABNORMAL LOW (ref 13.0–18.0)
LYMPHS ABS: 1.7 10*3/uL (ref 1.0–3.6)
LYMPHS PCT: 23 %
Lymphocytes Relative: 20 %
Lymphocytes Relative: 22 %
Lymphs Abs: 1.4 10*3/uL (ref 1.0–3.6)
Lymphs Abs: 1.7 10*3/uL (ref 1.0–3.6)
MCH: 31.6 pg (ref 26.0–34.0)
MCH: 31.9 pg (ref 26.0–34.0)
MCH: 31.9 pg (ref 26.0–34.0)
MCHC: 33.1 g/dL (ref 32.0–36.0)
MCHC: 33.3 g/dL (ref 32.0–36.0)
MCHC: 33.8 g/dL (ref 32.0–36.0)
MCV: 94.2 fL (ref 80.0–100.0)
MCV: 95.5 fL (ref 80.0–100.0)
MCV: 95.7 fL (ref 80.0–100.0)
MONOS PCT: 10 %
Monocytes Absolute: 0.7 10*3/uL (ref 0.2–1.0)
Monocytes Absolute: 0.7 10*3/uL (ref 0.2–1.0)
Monocytes Absolute: 0.8 10*3/uL (ref 0.2–1.0)
Monocytes Relative: 9 %
Monocytes Relative: 10 %
NEUTROS ABS: 5.1 10*3/uL (ref 1.4–6.5)
NEUTROS PCT: 63 %
NEUTROS PCT: 64 %
NEUTROS PCT: 68 %
Neutro Abs: 4.8 10*3/uL (ref 1.4–6.5)
Neutro Abs: 4.5 10*3/uL (ref 1.4–6.5)
PLATELETS: 271 10*3/uL (ref 150–440)
PLATELETS: 232 10*3/uL (ref 150–440)
Platelets: 311 10*3/uL (ref 150–440)
RBC: 2.42 MIL/uL — AB (ref 4.40–5.90)
RBC: 2.65 MIL/uL — AB (ref 4.40–5.90)
RBC: 2.95 MIL/uL — AB (ref 4.40–5.90)
RDW: 18 % — ABNORMAL HIGH (ref 11.5–14.5)
RDW: 20.9 % — ABNORMAL HIGH (ref 11.5–14.5)
RDW: 21.5 % — ABNORMAL HIGH (ref 11.5–14.5)
WBC: 7.1 10*3/uL (ref 3.8–10.6)
WBC: 7.1 10*3/uL (ref 3.8–10.6)
WBC: 7.9 10*3/uL (ref 3.8–10.6)

## 2017-07-20 LAB — COMPREHENSIVE METABOLIC PANEL
ALBUMIN: 3.2 g/dL — AB (ref 3.5–5.0)
ALK PHOS: 50 U/L (ref 38–126)
ALK PHOS: 60 U/L (ref 38–126)
ALT: 10 U/L — ABNORMAL LOW (ref 17–63)
ALT: 9 U/L — AB (ref 17–63)
ANION GAP: 10 (ref 5–15)
AST: 17 U/L (ref 15–41)
AST: 21 U/L (ref 15–41)
Albumin: 3.5 g/dL (ref 3.5–5.0)
Anion gap: 6 (ref 5–15)
BUN: 15 mg/dL (ref 6–20)
BUN: 15 mg/dL (ref 6–20)
CALCIUM: 8.6 mg/dL — AB (ref 8.9–10.3)
CALCIUM: 8.8 mg/dL — AB (ref 8.9–10.3)
CHLORIDE: 112 mmol/L — AB (ref 101–111)
CO2: 22 mmol/L (ref 22–32)
CO2: 23 mmol/L (ref 22–32)
CREATININE: 1.17 mg/dL (ref 0.61–1.24)
Chloride: 108 mmol/L (ref 101–111)
Creatinine, Ser: 1.32 mg/dL — ABNORMAL HIGH (ref 0.61–1.24)
GFR calc non Af Amer: 52 mL/min — ABNORMAL LOW (ref >60)
GFR calc non Af Amer: >60 mL/min (ref >60)
Glucose, Bld: 170 mg/dL — ABNORMAL HIGH (ref 65–99)
Glucose, Bld: 219 mg/dL — ABNORMAL HIGH (ref 65–99)
Potassium: 3.2 mmol/L — ABNORMAL LOW (ref 3.5–5.1)
Potassium: 3.6 mmol/L (ref 3.5–5.1)
SODIUM: 140 mmol/L (ref 135–145)
SODIUM: 141 mmol/L (ref 135–145)
Total Bilirubin: 0.6 mg/dL (ref 0.3–1.2)
Total Bilirubin: 0.9 mg/dL (ref 0.3–1.2)
Total Protein: 5.9 g/dL — ABNORMAL LOW (ref 6.5–8.1)
Total Protein: 6.5 g/dL (ref 6.5–8.1)

## 2017-07-20 LAB — PREPARE RBC (CROSSMATCH)

## 2017-07-20 MED ORDER — SODIUM CHLORIDE 0.9 % IV SOLN
10.0000 mL/h | Freq: Once | INTRAVENOUS | Status: AC
  Administered 2017-07-20: 10 mL/h via INTRAVENOUS

## 2017-07-20 MED ORDER — INSULIN LISPRO 100 UNIT/ML ~~LOC~~ SOLN
0.00 | SUBCUTANEOUS | Status: DC
Start: 2017-07-18 — End: 2017-07-20

## 2017-07-20 MED ORDER — PANTOPRAZOLE SODIUM 40 MG IV SOLR
40.00 mg | INTRAVENOUS | Status: DC
Start: 2017-07-18 — End: 2017-07-20

## 2017-07-20 MED ORDER — DEXTROSE 50 % IV SOLN
12.50 | INTRAVENOUS | Status: DC
Start: ? — End: 2017-07-20

## 2017-07-20 MED ORDER — ACETAMINOPHEN 325 MG PO TABS
650.00 mg | ORAL_TABLET | ORAL | Status: DC
Start: ? — End: 2017-07-20

## 2017-07-20 MED ORDER — ATORVASTATIN CALCIUM 40 MG PO TABS
40.00 mg | ORAL_TABLET | ORAL | Status: DC
Start: ? — End: 2017-07-20
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
## 2017-07-20 NOTE — ED Notes (Signed)
Patient is resting comfortably during transfusion of blood. No complaints. Vs normal.

## 2017-07-20 NOTE — ED Notes (Signed)
Pt to POV in wheelchair, pt denies any dizziness. VSS. NAD. Pt signed AMA. Understands that if he becomes dizzy or has more bleeding to come back to the ER. Discharge instructions and follow up discussed. All questions answered.

## 2017-07-20 NOTE — ED Notes (Signed)
Patient states he has been in and out of hospital for rectal bleeding and it has slowed down and stopped and today he had one episode of black stool.

## 2017-07-20 NOTE — ED Notes (Signed)
Patient signed Blood consent form

## 2017-07-21 LAB — TYPE AND SCREEN
ABO/RH(D): O POS
Antibody Screen: NEGATIVE
UNIT DIVISION: 0
UNIT DIVISION: 0

## 2017-07-21 LAB — BPAM RBC
Blood Product Expiration Date: 201902172359
Blood Product Expiration Date: 201903092359
ISSUE DATE / TIME: 201902120350
ISSUE DATE / TIME: 201902120537
Unit Type and Rh: 5100
Unit Type and Rh: 5100

## 2017-12-20 ENCOUNTER — Other Ambulatory Visit: Payer: Self-pay

## 2017-12-20 ENCOUNTER — Encounter: Payer: Self-pay | Admitting: Emergency Medicine

## 2017-12-20 ENCOUNTER — Emergency Department
Admission: EM | Admit: 2017-12-20 | Discharge: 2017-12-20 | Disposition: A | Discharge status: Home / Self Care | Payer: Medicare Other | Attending: Student in an Organized Health Care Education/Training Program | Admitting: Student in an Organized Health Care Education/Training Program

## 2017-12-20 DIAGNOSIS — Z79899 Other long term (current) drug therapy: Secondary | ICD-10-CM | POA: Insufficient documentation

## 2017-12-20 DIAGNOSIS — Z7984 Long term (current) use of oral hypoglycemic drugs: Secondary | ICD-10-CM | POA: Insufficient documentation

## 2017-12-20 DIAGNOSIS — E119 Type 2 diabetes mellitus without complications: Secondary | ICD-10-CM | POA: Insufficient documentation

## 2017-12-20 DIAGNOSIS — S8992XA Unspecified injury of left lower leg, initial encounter: Secondary | ICD-10-CM | POA: Diagnosis present

## 2017-12-20 DIAGNOSIS — Y999 Unspecified external cause status: Secondary | ICD-10-CM | POA: Diagnosis not present

## 2017-12-20 DIAGNOSIS — Y939 Activity, unspecified: Secondary | ICD-10-CM | POA: Diagnosis not present

## 2017-12-20 DIAGNOSIS — S80812A Abrasion, left lower leg, initial encounter: Secondary | ICD-10-CM | POA: Diagnosis not present

## 2017-12-20 DIAGNOSIS — W228XXA Striking against or struck by other objects, initial encounter: Secondary | ICD-10-CM | POA: Diagnosis not present

## 2017-12-20 DIAGNOSIS — F17200 Nicotine dependence, unspecified, uncomplicated: Secondary | ICD-10-CM | POA: Insufficient documentation

## 2017-12-20 DIAGNOSIS — Z23 Encounter for immunization: Secondary | ICD-10-CM | POA: Diagnosis not present

## 2017-12-20 DIAGNOSIS — T148XXA Other injury of unspecified body region, initial encounter: Secondary | ICD-10-CM

## 2017-12-20 DIAGNOSIS — Y929 Unspecified place or not applicable: Secondary | ICD-10-CM | POA: Diagnosis not present

## 2017-12-20 MED ORDER — TETANUS-DIPHTH-ACELL PERTUSSIS 5-2.5-18.5 LF-MCG/0.5 IM SUSP
0.5000 mL | Freq: Once | INTRAMUSCULAR | Status: AC
  Administered 2017-12-20: 0.5 mL via INTRAMUSCULAR
  Filled 2017-12-20: qty 0.5
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
## 2017-12-20 NOTE — ED Notes (Signed)
AAOx3.  Skin warm and dry.  NAD 

## 2017-12-20 NOTE — ED Triage Notes (Signed)
Pt to ed with c/o left leg abrasions.  Pt states he slipped on his trailer on Friday and caused some abrasion to shin area.  Areas are mild with noted scabs.  Pt states he is worried because he is a diabetic and wants to make sure they do not get infected.

## 2018-10-27 ENCOUNTER — Emergency Department
Admission: EM | Admit: 2018-10-27 | Discharge: 2018-10-27 | Disposition: A | Discharge status: Home / Self Care | Payer: Medicare Other | Attending: Emergency Medicine | Admitting: Emergency Medicine

## 2018-10-27 ENCOUNTER — Encounter: Payer: Self-pay | Admitting: Emergency Medicine

## 2018-10-27 ENCOUNTER — Other Ambulatory Visit: Payer: Self-pay

## 2018-10-27 DIAGNOSIS — F17200 Nicotine dependence, unspecified, uncomplicated: Secondary | ICD-10-CM | POA: Diagnosis not present

## 2018-10-27 DIAGNOSIS — Y929 Unspecified place or not applicable: Secondary | ICD-10-CM | POA: Insufficient documentation

## 2018-10-27 DIAGNOSIS — K140 Glossitis: Secondary | ICD-10-CM | POA: Diagnosis not present

## 2018-10-27 DIAGNOSIS — S0993XA Unspecified injury of face, initial encounter: Secondary | ICD-10-CM | POA: Diagnosis present

## 2018-10-27 DIAGNOSIS — Y999 Unspecified external cause status: Secondary | ICD-10-CM | POA: Insufficient documentation

## 2018-10-27 DIAGNOSIS — S025XXA Fracture of tooth (traumatic), initial encounter for closed fracture: Secondary | ICD-10-CM | POA: Diagnosis not present

## 2018-10-27 DIAGNOSIS — X58XXXA Exposure to other specified factors, initial encounter: Secondary | ICD-10-CM | POA: Insufficient documentation

## 2018-10-27 DIAGNOSIS — Z7984 Long term (current) use of oral hypoglycemic drugs: Secondary | ICD-10-CM | POA: Diagnosis not present

## 2018-10-27 DIAGNOSIS — Z79899 Other long term (current) drug therapy: Secondary | ICD-10-CM | POA: Diagnosis not present

## 2018-10-27 DIAGNOSIS — Y9389 Activity, other specified: Secondary | ICD-10-CM | POA: Insufficient documentation

## 2018-10-27 DIAGNOSIS — E119 Type 2 diabetes mellitus without complications: Secondary | ICD-10-CM | POA: Diagnosis not present

## 2018-10-27 MED ORDER — AMOXICILLIN 500 MG PO TABS: 500.0000 mg | ORAL_TABLET | Freq: Three times a day (TID) | ORAL | 0 refills | Status: DC

## 2018-10-27 MED ORDER — CHLORHEXIDINE GLUCONATE 0.12 % MT SOLN: 15.0000 mL | Freq: Two times a day (BID) | OROMUCOSAL | 0 refills | Status: DC

## 2018-10-27 MED ORDER — DENTAL WAX WAX: 1.0000 | 0 refills | Status: DC | PRN

## 2018-10-27 NOTE — ED Triage Notes (Signed)
Pt reports was eating a piece of ice and broke a piece of a tooth off on the left upper jaw. Pt states it is painful.

## 2018-10-27 NOTE — Discharge Instructions (Signed)
Please call [facility] to speak to them about scheduling an appointment. When I called, they had an opening next Wednesday and are taking new patients.  Use the dental wax to cover the sharp part of the broken tooth.  Return to the ER for symptoms that change or worsen if unable to schedule an appointment.

## 2018-10-27 NOTE — ED Notes (Signed)
See triage note  States he broke a tooth on the left   And the tooth has irritated the side of his tongue
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
## 2019-01-17 DIAGNOSIS — E785 Hyperlipidemia, unspecified: Secondary | ICD-10-CM | POA: Insufficient documentation

## 2019-01-17 DIAGNOSIS — N400 Enlarged prostate without lower urinary tract symptoms: Secondary | ICD-10-CM | POA: Insufficient documentation

## 2019-04-29 IMAGING — NM NM GI BLOOD LOSS
3 series · 14 of 14 positions shown · non-contrast
Comparison: 05/19/2007.

CLINICAL DATA: Bloody stools since 07/11/2017.  Near syncope.

EXAM:
NUCLEAR MEDICINE GASTROINTESTINAL BLEEDING SCAN
TECHNIQUE: Sequential abdominal images were obtained following intravenous
administration of Qc-KKm labeled red blood cells.
RADIOPHARMACEUTICALS:  23.0 mCi Qc-KKm pertechnetate in-vitro
labeled red cells.

[Series 1000: gi bleed · 4.80mm/px · 6 of 60 frames shown (1 of 2)]
[frame 6/60]
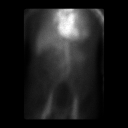
[frame 16/60]
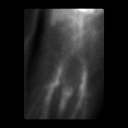
[frame 26/60]
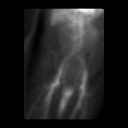
[frame 36/60]
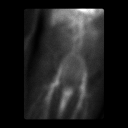
[frame 46/60]
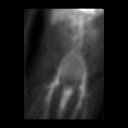
[frame 56/60]
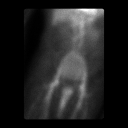

[Series 1000: gi bleed · 4.80mm/px · 6 of 60 frames shown (2 of 2)]
[frame 6/60]
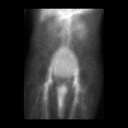
[frame 16/60]
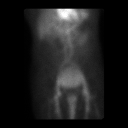
[frame 26/60]
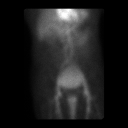
[frame 36/60]
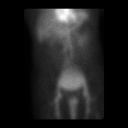
[frame 46/60]
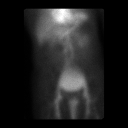
[frame 56/60]
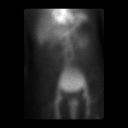

[Series 1000: gi bleed static · 2.40mm/px · 2 of 2 frames shown]
[frame 1/2]
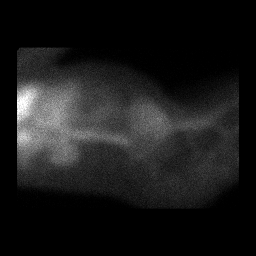
[frame 2/2]
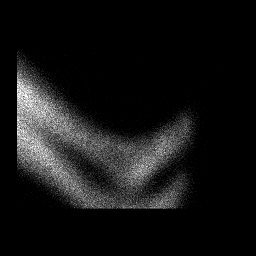

[14 of 14 positions shown; findings below may reference images not displayed]

FINDINGS: No areas of abnormal radiotracer accumulation to indicate active GI
bleeding.
IMPRESSION: No areas of abnormal radiotracer accumulation to indicate active GI
bleeding.

## 2019-09-11 ENCOUNTER — Other Ambulatory Visit: Payer: Self-pay

## 2019-09-11 ENCOUNTER — Encounter: Payer: Self-pay | Admitting: Emergency Medicine

## 2019-09-11 DIAGNOSIS — Z79899 Other long term (current) drug therapy: Secondary | ICD-10-CM

## 2019-09-11 DIAGNOSIS — Z20822 Contact with and (suspected) exposure to covid-19: Secondary | ICD-10-CM | POA: Diagnosis present

## 2019-09-11 DIAGNOSIS — K922 Gastrointestinal hemorrhage, unspecified: Secondary | ICD-10-CM | POA: Diagnosis not present

## 2019-09-11 DIAGNOSIS — Z7984 Long term (current) use of oral hypoglycemic drugs: Secondary | ICD-10-CM

## 2019-09-11 DIAGNOSIS — Z8711 Personal history of peptic ulcer disease: Secondary | ICD-10-CM

## 2019-09-11 DIAGNOSIS — E1122 Type 2 diabetes mellitus with diabetic chronic kidney disease: Secondary | ICD-10-CM | POA: Diagnosis present

## 2019-09-11 DIAGNOSIS — I129 Hypertensive chronic kidney disease with stage 1 through stage 4 chronic kidney disease, or unspecified chronic kidney disease: Secondary | ICD-10-CM | POA: Diagnosis present

## 2019-09-11 DIAGNOSIS — E1165 Type 2 diabetes mellitus with hyperglycemia: Secondary | ICD-10-CM | POA: Diagnosis present

## 2019-09-11 DIAGNOSIS — N179 Acute kidney failure, unspecified: Secondary | ICD-10-CM | POA: Diagnosis present

## 2019-09-11 DIAGNOSIS — D62 Acute posthemorrhagic anemia: Secondary | ICD-10-CM | POA: Diagnosis not present

## 2019-09-11 DIAGNOSIS — Z5329 Procedure and treatment not carried out because of patient's decision for other reasons: Secondary | ICD-10-CM | POA: Diagnosis not present

## 2019-09-11 DIAGNOSIS — N183 Chronic kidney disease, stage 3 unspecified: Secondary | ICD-10-CM | POA: Diagnosis present

## 2019-09-11 DIAGNOSIS — K921 Melena: Secondary | ICD-10-CM | POA: Diagnosis present

## 2019-09-11 LAB — COMPREHENSIVE METABOLIC PANEL
ALT: 12 U/L (ref 0–44)
AST: 17 U/L (ref 15–41)
Albumin: 3.8 g/dL (ref 3.5–5.0)
Alkaline Phosphatase: 59 U/L (ref 38–126)
Anion gap: 9 (ref 5–15)
BUN: 50 mg/dL — ABNORMAL HIGH (ref 8–23)
CO2: 23 mmol/L (ref 22–32)
Calcium: 9 mg/dL (ref 8.9–10.3)
Chloride: 103 mmol/L (ref 98–111)
Creatinine, Ser: 1.75 mg/dL — ABNORMAL HIGH (ref 0.61–1.24)
GFR calc Af Amer: 43 mL/min — ABNORMAL LOW (ref >60)
GFR calc non Af Amer: 37 mL/min — ABNORMAL LOW (ref >60)
Glucose, Bld: 382 mg/dL — ABNORMAL HIGH (ref 70–99)
Potassium: 4.3 mmol/L (ref 3.5–5.1)
Sodium: 135 mmol/L (ref 135–145)
Total Bilirubin: 1.5 mg/dL — ABNORMAL HIGH (ref 0.3–1.2)
Total Protein: 6.5 g/dL (ref 6.5–8.1)

## 2019-09-11 LAB — PROTIME-INR
INR: 1.1 (ref 0.8–1.2)
Prothrombin Time: 14.3 seconds (ref 11.4–15.2)

## 2019-09-11 LAB — CBC
HCT: 20.4 % — ABNORMAL LOW (ref 39.0–52.0)
Hemoglobin: 6.5 g/dL — ABNORMAL LOW (ref 13.0–17.0)
MCH: 34 pg (ref 26.0–34.0)
MCHC: 31.9 g/dL (ref 30.0–36.0)
MCV: 106.8 fL — ABNORMAL HIGH (ref 80.0–100.0)
Platelets: 242 10*3/uL (ref 150–400)
RBC: 1.91 MIL/uL — ABNORMAL LOW (ref 4.22–5.81)
RDW: 14.3 % (ref 11.5–15.5)
WBC: 8.3 10*3/uL (ref 4.0–10.5)
nRBC: 0.2 % (ref 0.0–0.2)

## 2019-09-11 NOTE — ED Triage Notes (Signed)
Pt to ED via POV for c/o rectal bleeding for the last 2 days. Pt denies any Hemoridds but states that when he uses the bathroom he see's bright and dark blood. Pt A&Ox4. Pt denies any SHOB/pain but st he had a "little chest pain 2x days ago"

## 2019-09-12 ENCOUNTER — Inpatient Hospital Stay
Admission: EM | Admit: 2019-09-12 | Discharge: 2019-09-12 | DRG: 812 | Discharge status: Against Medical Advice | Payer: Medicare Other | Attending: Internal Medicine | Admitting: Internal Medicine

## 2019-09-12 DIAGNOSIS — E1122 Type 2 diabetes mellitus with diabetic chronic kidney disease: Secondary | ICD-10-CM | POA: Diagnosis present

## 2019-09-12 DIAGNOSIS — Z79899 Other long term (current) drug therapy: Secondary | ICD-10-CM | POA: Diagnosis not present

## 2019-09-12 DIAGNOSIS — K921 Melena: Secondary | ICD-10-CM | POA: Diagnosis present

## 2019-09-12 DIAGNOSIS — N183 Chronic kidney disease, stage 3 unspecified: Secondary | ICD-10-CM | POA: Diagnosis present

## 2019-09-12 DIAGNOSIS — D62 Acute posthemorrhagic anemia: Secondary | ICD-10-CM

## 2019-09-12 DIAGNOSIS — K922 Gastrointestinal hemorrhage, unspecified: Secondary | ICD-10-CM | POA: Diagnosis present

## 2019-09-12 DIAGNOSIS — E1165 Type 2 diabetes mellitus with hyperglycemia: Secondary | ICD-10-CM

## 2019-09-12 DIAGNOSIS — Z7984 Long term (current) use of oral hypoglycemic drugs: Secondary | ICD-10-CM | POA: Diagnosis not present

## 2019-09-12 DIAGNOSIS — K279 Peptic ulcer, site unspecified, unspecified as acute or chronic, without hemorrhage or perforation: Secondary | ICD-10-CM

## 2019-09-12 DIAGNOSIS — Z8719 Personal history of other diseases of the digestive system: Secondary | ICD-10-CM

## 2019-09-12 DIAGNOSIS — N179 Acute kidney failure, unspecified: Secondary | ICD-10-CM

## 2019-09-12 DIAGNOSIS — I10 Essential (primary) hypertension: Secondary | ICD-10-CM

## 2019-09-12 DIAGNOSIS — N1831 Chronic kidney disease, stage 3a: Secondary | ICD-10-CM

## 2019-09-12 DIAGNOSIS — Z20822 Contact with and (suspected) exposure to covid-19: Secondary | ICD-10-CM | POA: Diagnosis present

## 2019-09-12 DIAGNOSIS — I129 Hypertensive chronic kidney disease with stage 1 through stage 4 chronic kidney disease, or unspecified chronic kidney disease: Secondary | ICD-10-CM | POA: Diagnosis present

## 2019-09-12 DIAGNOSIS — D649 Anemia, unspecified: Secondary | ICD-10-CM

## 2019-09-12 DIAGNOSIS — Z5329 Procedure and treatment not carried out because of patient's decision for other reasons: Secondary | ICD-10-CM | POA: Diagnosis not present

## 2019-09-12 DIAGNOSIS — Z8711 Personal history of peptic ulcer disease: Secondary | ICD-10-CM | POA: Diagnosis not present

## 2019-09-12 LAB — TROPONIN I (HIGH SENSITIVITY)
Troponin I (High Sensitivity): 4 ng/L (ref <18)
Troponin I (High Sensitivity): 4 ng/L (ref <18)

## 2019-09-12 LAB — RESPIRATORY PANEL BY RT PCR (FLU A&B, COVID)
Influenza A by PCR: NEGATIVE
Influenza B by PCR: NEGATIVE
SARS Coronavirus 2 by RT PCR: NEGATIVE

## 2019-09-12 LAB — PREPARE RBC (CROSSMATCH)

## 2019-09-12 MED ORDER — ACETAMINOPHEN 650 MG RE SUPP: 650.0000 mg | Freq: Four times a day (QID) | RECTAL | Status: DC | PRN

## 2019-09-12 MED ORDER — PANTOPRAZOLE SODIUM 40 MG IV SOLR: 40.0000 mg | INTRAVENOUS | Status: AC

## 2019-09-12 MED ORDER — SODIUM CHLORIDE 0.9 % IV SOLN
10.0000 mL/h | Freq: Once | INTRAVENOUS | Status: AC
  Administered 2019-09-12: 10 mL/h via INTRAVENOUS

## 2019-09-12 MED ORDER — ONDANSETRON HCL 4 MG/2ML IJ SOLN: 4.0000 mg | Freq: Four times a day (QID) | INTRAMUSCULAR | Status: DC | PRN

## 2019-09-12 MED ORDER — SODIUM CHLORIDE 0.9 % IV SOLN: 80.0000 mg | Freq: Once | INTRAVENOUS | Status: DC

## 2019-09-12 MED ORDER — SODIUM CHLORIDE 0.9% IV SOLUTION
Freq: Once | INTRAVENOUS | Status: DC
  Filled 2019-09-12: qty 250

## 2019-09-12 MED ORDER — SODIUM CHLORIDE 0.9 % IV SOLN: INTRAVENOUS | Status: DC

## 2019-09-12 MED ORDER — SODIUM CHLORIDE 0.9 % IV SOLN
8.0000 mg/h | INTRAVENOUS | Status: DC
  Administered 2019-09-12: 8 mg/h via INTRAVENOUS
  Filled 2019-09-12: qty 80

## 2019-09-12 MED ORDER — ONDANSETRON HCL 4 MG PO TABS: 4.0000 mg | ORAL_TABLET | Freq: Four times a day (QID) | ORAL | Status: DC | PRN

## 2019-09-12 MED ORDER — ACETAMINOPHEN 325 MG PO TABS: 650.0000 mg | ORAL_TABLET | Freq: Four times a day (QID) | ORAL | Status: DC | PRN

## 2019-09-12 NOTE — ED Notes (Signed)
Pt agreeing to stay for completion of second unit of blood, but is demanding to leave after completion of unit. Pt verbalizes that he will be leaving against medical advice when doing so. MD Patel aware. Pt stating "I have to get to my job I am not staying".
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
## 2019-09-12 NOTE — ED Notes (Signed)
Pt in NAD at time of D/C. Pt wheeled to lobby. Pt encouraged to return if anything happens. Pt verbalizes understanding that he is leaving against medical advice. AMA paper signed. Pt family member at bedside with pt at departure time.
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
## 2019-09-13 LAB — TYPE AND SCREEN
ABO/RH(D): O POS
Antibody Screen: NEGATIVE
Unit division: 0
Unit division: 0

## 2019-09-13 LAB — BPAM RBC
Blood Product Expiration Date: 202105062359
Blood Product Expiration Date: 202105062359
ISSUE DATE / TIME: 202104060230
ISSUE DATE / TIME: 202104060614
Unit Type and Rh: 5100
Unit Type and Rh: 5100

## 2019-10-24 ENCOUNTER — Emergency Department
Admission: EM | Admit: 2019-10-24 | Discharge: 2019-10-24 | Disposition: A | Discharge status: Home / Self Care | Payer: Medicare Other | Attending: Emergency Medicine | Admitting: Emergency Medicine

## 2019-10-24 ENCOUNTER — Emergency Department: Payer: Medicare Other

## 2019-10-24 ENCOUNTER — Other Ambulatory Visit: Payer: Self-pay

## 2019-10-24 DIAGNOSIS — Z7984 Long term (current) use of oral hypoglycemic drugs: Secondary | ICD-10-CM | POA: Insufficient documentation

## 2019-10-24 DIAGNOSIS — R0602 Shortness of breath: Secondary | ICD-10-CM | POA: Insufficient documentation

## 2019-10-24 DIAGNOSIS — Z79899 Other long term (current) drug therapy: Secondary | ICD-10-CM | POA: Insufficient documentation

## 2019-10-24 DIAGNOSIS — E1122 Type 2 diabetes mellitus with diabetic chronic kidney disease: Secondary | ICD-10-CM | POA: Insufficient documentation

## 2019-10-24 DIAGNOSIS — M7989 Other specified soft tissue disorders: Secondary | ICD-10-CM

## 2019-10-24 DIAGNOSIS — R2243 Localized swelling, mass and lump, lower limb, bilateral: Secondary | ICD-10-CM | POA: Diagnosis present

## 2019-10-24 DIAGNOSIS — F17210 Nicotine dependence, cigarettes, uncomplicated: Secondary | ICD-10-CM | POA: Insufficient documentation

## 2019-10-24 DIAGNOSIS — N183 Chronic kidney disease, stage 3 unspecified: Secondary | ICD-10-CM | POA: Diagnosis not present

## 2019-10-24 DIAGNOSIS — I82411 Acute embolism and thrombosis of right femoral vein: Secondary | ICD-10-CM | POA: Diagnosis not present

## 2019-10-24 DIAGNOSIS — I129 Hypertensive chronic kidney disease with stage 1 through stage 4 chronic kidney disease, or unspecified chronic kidney disease: Secondary | ICD-10-CM | POA: Diagnosis not present

## 2019-10-24 LAB — CBC
HCT: 32.4 % — ABNORMAL LOW (ref 39.0–52.0)
Hemoglobin: 10.4 g/dL — ABNORMAL LOW (ref 13.0–17.0)
MCH: 30.6 pg (ref 26.0–34.0)
MCHC: 32.1 g/dL (ref 30.0–36.0)
MCV: 95.3 fL (ref 80.0–100.0)
Platelets: 345 10*3/uL (ref 150–400)
RBC: 3.4 MIL/uL — ABNORMAL LOW (ref 4.22–5.81)
RDW: 14.8 % (ref 11.5–15.5)
WBC: 7.6 10*3/uL (ref 4.0–10.5)
nRBC: 0 % (ref 0.0–0.2)

## 2019-10-24 LAB — URINALYSIS, COMPLETE (UACMP) WITH MICROSCOPIC
Bilirubin Urine: NEGATIVE
Glucose, UA: >=500 mg/dL — AB
Hgb urine dipstick: NEGATIVE
Ketones, ur: NEGATIVE mg/dL
Nitrite: NEGATIVE
Protein, ur: NEGATIVE mg/dL
Specific Gravity, Urine: 1.027 (ref 1.005–1.030)
WBC, UA: >50 WBC/hpf — ABNORMAL HIGH (ref 0–5)
pH: 6 (ref 5.0–8.0)

## 2019-10-24 LAB — BASIC METABOLIC PANEL
Anion gap: 10 (ref 5–15)
BUN: 19 mg/dL (ref 8–23)
CO2: 25 mmol/L (ref 22–32)
Calcium: 9.3 mg/dL (ref 8.9–10.3)
Chloride: 104 mmol/L (ref 98–111)
Creatinine, Ser: 1.32 mg/dL — ABNORMAL HIGH (ref 0.61–1.24)
GFR calc Af Amer: >60 mL/min (ref >60)
GFR calc non Af Amer: 52 mL/min — ABNORMAL LOW (ref >60)
Glucose, Bld: 218 mg/dL — ABNORMAL HIGH (ref 70–99)
Potassium: 4.3 mmol/L (ref 3.5–5.1)
Sodium: 139 mmol/L (ref 135–145)

## 2019-10-24 LAB — BRAIN NATRIURETIC PEPTIDE: B Natriuretic Peptide: 77.8 pg/mL (ref 0.0–100.0)

## 2019-10-24 MED ORDER — APIXABAN 5 MG PO TABS: 10.0000 mg | ORAL_TABLET | Freq: Two times a day (BID) | ORAL | 0 refills | Status: DC

## 2019-10-24 MED ORDER — APIXABAN 5 MG PO TABS
10.0000 mg | ORAL_TABLET | Freq: Once | ORAL | Status: AC
  Administered 2019-10-24: 10 mg via ORAL
  Filled 2019-10-24: qty 2
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
## 2019-10-24 NOTE — ED Triage Notes (Signed)
Pt c/o swelling in his feet since yesterday, denies pain. Denies any swelling in his legs.

## 2019-10-24 NOTE — ED Notes (Signed)
See triage note  Presents with some swelling to feet  States he noticed this yesterday  Family at bedside

## 2019-10-24 NOTE — Discharge Instructions (Addendum)
Its very important to follow-up with vascular doctor, Dr. Wyn Quaker Please start taking Eliquis twice daily until Dr. Wyn Quaker makes changes in your prescription.  Please call phone number for Dr. Driscilla Grammes office to make appointment.

## 2019-10-27 ENCOUNTER — Encounter (INDEPENDENT_AMBULATORY_CARE_PROVIDER_SITE_OTHER): Payer: Self-pay | Admitting: Nurse Practitioner

## 2019-10-27 ENCOUNTER — Ambulatory Visit (INDEPENDENT_AMBULATORY_CARE_PROVIDER_SITE_OTHER): Payer: Medicare Other | Admitting: Nurse Practitioner

## 2019-10-27 ENCOUNTER — Other Ambulatory Visit: Payer: Self-pay

## 2019-10-27 VITALS — BP 127/73 | HR 103 | Resp 16 | Wt 189.6 lb

## 2019-10-27 DIAGNOSIS — E785 Hyperlipidemia, unspecified: Secondary | ICD-10-CM | POA: Diagnosis not present

## 2019-10-27 DIAGNOSIS — I82411 Acute embolism and thrombosis of right femoral vein: Secondary | ICD-10-CM | POA: Diagnosis not present

## 2019-10-27 DIAGNOSIS — E1122 Type 2 diabetes mellitus with diabetic chronic kidney disease: Secondary | ICD-10-CM | POA: Diagnosis not present

## 2019-10-27 DIAGNOSIS — N183 Chronic kidney disease, stage 3 unspecified: Secondary | ICD-10-CM | POA: Diagnosis not present

## 2019-10-27 MED ORDER — APIXABAN 5 MG PO TABS: 5.0000 mg | ORAL_TABLET | Freq: Two times a day (BID) | ORAL | 5 refills | Status: DC
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
## 2019-12-21 ENCOUNTER — Other Ambulatory Visit (INDEPENDENT_AMBULATORY_CARE_PROVIDER_SITE_OTHER): Payer: Self-pay | Admitting: Nurse Practitioner

## 2019-12-21 DIAGNOSIS — I82432 Acute embolism and thrombosis of left popliteal vein: Secondary | ICD-10-CM

## 2019-12-21 DIAGNOSIS — I82411 Acute embolism and thrombosis of right femoral vein: Secondary | ICD-10-CM

## 2019-12-22 ENCOUNTER — Encounter (INDEPENDENT_AMBULATORY_CARE_PROVIDER_SITE_OTHER): Payer: Self-pay

## 2019-12-22 ENCOUNTER — Encounter (INDEPENDENT_AMBULATORY_CARE_PROVIDER_SITE_OTHER): Payer: Medicare Other

## 2019-12-22 ENCOUNTER — Ambulatory Visit (INDEPENDENT_AMBULATORY_CARE_PROVIDER_SITE_OTHER): Payer: Medicare Other | Admitting: Nurse Practitioner

## 2020-04-25 ENCOUNTER — Other Ambulatory Visit: Payer: Self-pay

## 2020-04-25 ENCOUNTER — Encounter: Payer: Self-pay | Admitting: Emergency Medicine

## 2020-04-25 ENCOUNTER — Emergency Department
Admission: EM | Admit: 2020-04-25 | Discharge: 2020-04-25 | Disposition: A | Discharge status: Home / Self Care | Payer: Medicare Other | Attending: Emergency Medicine | Admitting: Emergency Medicine

## 2020-04-25 DIAGNOSIS — Z7984 Long term (current) use of oral hypoglycemic drugs: Secondary | ICD-10-CM | POA: Insufficient documentation

## 2020-04-25 DIAGNOSIS — Z794 Long term (current) use of insulin: Secondary | ICD-10-CM | POA: Diagnosis not present

## 2020-04-25 DIAGNOSIS — Z760 Encounter for issue of repeat prescription: Secondary | ICD-10-CM | POA: Insufficient documentation

## 2020-04-25 DIAGNOSIS — N183 Chronic kidney disease, stage 3 unspecified: Secondary | ICD-10-CM | POA: Diagnosis not present

## 2020-04-25 DIAGNOSIS — I129 Hypertensive chronic kidney disease with stage 1 through stage 4 chronic kidney disease, or unspecified chronic kidney disease: Secondary | ICD-10-CM | POA: Diagnosis not present

## 2020-04-25 DIAGNOSIS — Z79899 Other long term (current) drug therapy: Secondary | ICD-10-CM | POA: Diagnosis not present

## 2020-04-25 DIAGNOSIS — F17200 Nicotine dependence, unspecified, uncomplicated: Secondary | ICD-10-CM | POA: Diagnosis not present

## 2020-04-25 DIAGNOSIS — E119 Type 2 diabetes mellitus without complications: Secondary | ICD-10-CM | POA: Diagnosis not present

## 2020-04-25 MED ORDER — PIOGLITAZONE HCL 30 MG PO TABS: 30.0000 mg | ORAL_TABLET | Freq: Every day | ORAL | 1 refills | Status: DC

## 2020-04-25 NOTE — ED Notes (Signed)
Pt verbalized understanding of d/c instructions at this time and denies further questions

## 2020-04-25 NOTE — ED Triage Notes (Signed)
Pt comes via POV from home with c/o needing medication refill. Pt states he just needs a refill. Pt denies any other symptoms.
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
## 2020-04-25 NOTE — Discharge Instructions (Addendum)
Take Pioglitazone once daily.

## 2020-05-08 ENCOUNTER — Emergency Department: Payer: No Typology Code available for payment source

## 2020-05-08 ENCOUNTER — Emergency Department
Admission: EM | Admit: 2020-05-08 | Discharge: 2020-05-08 | Disposition: A | Discharge status: Home / Self Care | Payer: No Typology Code available for payment source | Attending: Emergency Medicine | Admitting: Emergency Medicine

## 2020-05-08 DIAGNOSIS — E119 Type 2 diabetes mellitus without complications: Secondary | ICD-10-CM | POA: Insufficient documentation

## 2020-05-08 DIAGNOSIS — R410 Disorientation, unspecified: Secondary | ICD-10-CM | POA: Insufficient documentation

## 2020-05-08 DIAGNOSIS — Z7984 Long term (current) use of oral hypoglycemic drugs: Secondary | ICD-10-CM | POA: Insufficient documentation

## 2020-05-08 DIAGNOSIS — E785 Hyperlipidemia, unspecified: Secondary | ICD-10-CM | POA: Insufficient documentation

## 2020-05-08 DIAGNOSIS — I10 Essential (primary) hypertension: Secondary | ICD-10-CM | POA: Insufficient documentation

## 2020-05-08 LAB — COMPREHENSIVE METABOLIC PANEL
ALT: 10 U/L (ref 0–55)
AST (SGOT): 16 U/L (ref 5–34)
Albumin/Globulin Ratio: 1.2 (ref 0.9–2.2)
Albumin: 4.8 g/dL (ref 3.5–5.0)
Alkaline Phosphatase: 92 U/L (ref 38–106)
Anion Gap: 17 — ABNORMAL HIGH (ref 5.0–15.0)
BUN: 15 mg/dL (ref 9.0–28.0)
Bilirubin, Total: 0.5 mg/dL (ref 0.2–1.2)
CO2: 23 mEq/L (ref 22–29)
Calcium: 10.9 mg/dL — ABNORMAL HIGH (ref 7.9–10.2)
Chloride: 102 mEq/L (ref 100–111)
Creatinine: 1.6 mg/dL — ABNORMAL HIGH (ref 0.7–1.3)
Globulin: 4.1 g/dL — ABNORMAL HIGH (ref 2.0–3.6)
Glucose: 346 mg/dL — ABNORMAL HIGH (ref 70–100)
Potassium: 4.4 mEq/L (ref 3.5–5.1)
Protein, Total: 8.9 g/dL — ABNORMAL HIGH (ref 6.0–8.3)
Sodium: 142 mEq/L (ref 136–145)

## 2020-05-08 LAB — CBC AND DIFFERENTIAL
Absolute NRBC: 0 10*3/uL (ref 0.00–0.00)
Basophils Absolute Automated: 0.04 10*3/uL (ref 0.00–0.08)
Basophils Automated: 0.7 %
Eosinophils Absolute Automated: 0.21 10*3/uL (ref 0.00–0.44)
Eosinophils Automated: 3.9 %
Hematocrit: 43.9 % (ref 37.6–49.6)
Hgb: 14.4 g/dL (ref 12.5–17.1)
Immature Granulocytes Absolute: 0.02 10*3/uL (ref 0.00–0.07)
Immature Granulocytes: 0.4 %
Lymphocytes Absolute Automated: 1.58 10*3/uL (ref 0.42–3.22)
Lymphocytes Automated: 29.5 %
MCH: 30.9 pg (ref 25.1–33.5)
MCHC: 32.8 g/dL (ref 31.5–35.8)
MCV: 94.2 fL (ref 78.0–96.0)
MPV: 11.8 fL (ref 8.9–12.5)
Monocytes Absolute Automated: 0.49 10*3/uL (ref 0.21–0.85)
Monocytes: 9.1 %
Neutrophils Absolute: 3.02 10*3/uL (ref 1.10–6.33)
Neutrophils: 56.4 %
Nucleated RBC: 0 /100 WBC (ref 0.0–0.0)
Platelets: 239 10*3/uL (ref 142–346)
RBC: 4.66 10*6/uL (ref 4.20–5.90)
RDW: 15 % (ref 11–15)
WBC: 5.36 10*3/uL (ref 3.10–9.50)

## 2020-05-08 LAB — ECG 12-LEAD
Atrial Rate: 75 {beats}/min
P Axis: 49 degrees
P-R Interval: 146 ms
Q-T Interval: 412 ms
QRS Duration: 82 ms
QTC Calculation (Bezet): 460 ms
R Axis: -50 degrees
T Axis: 28 degrees
Ventricular Rate: 75 {beats}/min

## 2020-05-08 LAB — GFR: EGFR: 42.2

## 2020-05-08 LAB — GLUCOSE WHOLE BLOOD - POCT: Whole Blood Glucose POCT: 330 mg/dL — ABNORMAL HIGH (ref 70–100)

## 2020-05-08 LAB — MAGNESIUM: Magnesium: 2.1 mg/dL (ref 1.6–2.6)

## 2020-05-08 MED ORDER — PIOGLITAZONE HCL 15 MG PO TABS
15.0000 mg | ORAL_TABLET | Freq: Every day | ORAL | 0 refills | Status: AC
Start: 2020-05-08 — End: 2020-05-15

## 2020-05-08 MED ORDER — EMPAGLIFLOZIN 25 MG PO TABS
25.0000 mg | ORAL_TABLET | Freq: Every morning | ORAL | 0 refills | Status: AC
Start: 2020-05-08 — End: 2020-05-15

## 2020-05-08 MED ORDER — METFORMIN HCL 500 MG PO TABS
1000.0000 mg | ORAL_TABLET | Freq: Two times a day (BID) | ORAL | 0 refills | Status: AC
Start: 2020-05-08 — End: 2020-05-15

## 2020-05-08 MED ORDER — LISINOPRIL 10 MG PO TABS
10.0000 mg | ORAL_TABLET | Freq: Every day | ORAL | 0 refills | Status: AC
Start: 2020-05-08 — End: 2020-05-15

## 2020-05-08 MED ORDER — GLIMEPIRIDE 1 MG PO TABS
1.0000 mg | ORAL_TABLET | Freq: Every morning | ORAL | 0 refills | Status: AC
Start: 2020-05-08 — End: 2020-05-15
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
## 2020-05-08 NOTE — Progress Notes (Signed)
CM had s/w Officer Thad Ranger [phone] who gave CM all pertinent info.  He had s/w pnt's brother, BJ who is married to [employer] listed in note.  They can come back to [facility] to pick up pnt tomorrow.  Officer did make report to APS and s/w Irving Burton.  CM obtained assigned APS info.  CM s/w Ganene [place], assigned APS worker who will be taking pnt to a hotel w his wife/SO and family will come to pick up them up tomorrow.  Car is at [employer] along with pnt's keys.  No other d/c needs identified at this time.  CM will cont to follow as needed.

## 2020-05-08 NOTE — ED Provider Notes (Signed)
History     Chief Complaint   Patient presents with    Altered Mental Status     The history is provided by the patient (significant other).   Altered Mental Status  Associated symptoms: no fever and no headaches         74 year old male with history of hypertension hyperlipidemia diabetes on Metformin here with his longtime significant other/partner for the past 30 something years.  They were pulled over by police today because he was driving too slow.  Neither one of them know what state they were visiting but they spent Thanksgiving with the patient's girlfriend's aunt Claris Che.  They say this was in [place].  They cannot tell me what state.  They say that they went there for Thanksgiving had dinner and have been lost trying to get home ever since.  The patient has now been to 3 hospitals due difficulty finding home and police involvement. Yesterday was last visit they do not know where.   He has no complaints.  His girlfriend says that he has been confused for at least 5 days. She says he has not been complaining about anything and has not fallen.  They have ran out of his home meds.   Pt says he is not confused. He has no complaints. No headache, chest pain, dyspnea, abdominal pain. He does not feel lost and knows he should be in [place].    They have contacted family.  The girlfriend says that their niece Vernona Rieger is coming with a family friend and her husband to come and pick them up.  Police are involved. They have not been in an accident.       Past Medical History:   Diagnosis Date    Diabetes mellitus     Hyperlipidemia     Hypertension     Shingles        Past Surgical History:   Procedure Laterality Date    ABDOMINAL SURGERY         No family history on file.    Social  Social History     Tobacco Use    Smoking status: Current Every Day Smoker     Packs/day: 1.00     Types: Cigarettes    Smokeless tobacco: Former Market researcher Use: Never used   Substance Use Topics    Alcohol  use: Never    Drug use: Never       .     No Known Allergies    Home Medications     Med List Status: Complete Set By: Lucinda Dell, RN at 05/08/2020  3:48 PM                apixaban (Eliquis) 5 MG     Take 5 mg by mouth     atorvastatin (LIPITOR) 40 MG tablet     Take 40 mg by mouth daily     empagliflozin (JARDIANCE) 25 MG tablet     Take 25 mg by mouth daily     gabapentin (NEURONTIN) 300 MG capsule     Take 300 mg by mouth nightly     glimepiride (AMARYL) 4 MG tablet     Take 4 mg by mouth every morning before breakfast     lisinopril (ZESTRIL) 10 MG tablet     Take 10 mg by mouth daily     metFORMIN (GLUCOPHAGE) 1000 MG tablet     Take 1,000 mg by mouth  2 (two) times daily with meals     pioglitazone (ACTOS) 30 MG tablet     Take 30 mg by mouth daily     tamsulosin (FLOMAX) 0.4 MG Cap     Take 0.4 mg by mouth daily                     Review of Systems   Unable to perform ROS: Mental status change   Constitutional: Negative for fever.   Respiratory: Negative for shortness of breath.    Cardiovascular: Negative for chest pain.   Musculoskeletal: Negative for back pain.   Neurological: Negative for headaches.       Physical Exam    BP: 139/89, Heart Rate: 92, Temp: 98.9 F (37.2 C), Resp Rate: 18, SpO2: 94 %, Weight: 83.3 kg    Physical Exam  Vitals and nursing note reviewed.   Constitutional:       Appearance: He is well-developed.   HENT:      Head: Normocephalic and atraumatic.   Eyes:      Conjunctiva/sclera: Conjunctivae normal.   Cardiovascular:      Rate and Rhythm: Normal rate and regular rhythm.      Heart sounds: Normal heart sounds.   Pulmonary:      Effort: Pulmonary effort is normal.      Breath sounds: Normal breath sounds.   Abdominal:      General: Bowel sounds are normal.      Palpations: Abdomen is soft.   Skin:     General: Skin is warm and dry.   Neurological:      Mental Status: He is alert. He is disoriented and confused.      GCS: GCS eye subscore is 4. GCS verbal subscore is 5. GCS motor  subscore is 5.      Cranial Nerves: No cranial nerve deficit or facial asymmetry.      Sensory: No sensory deficit.      Motor: No weakness.      Coordination: Coordination normal.   Psychiatric:         Mood and Affect: Mood normal.           MDM and ED Course     ED Medication Orders (From admission, onward)    None             MDM    74 year old male presents to the emergency department via EMS for driving too slowly and change in mental status over the past 5 days at least   patient has never been here in our system he has no complaints his longtime significant other says that they are lost    Patient is hemodynamically stable, afebrile, following commands here in NAD  He is neurologically intact he has no acute complaints  He is a poor historian as well as his girlfriend as symptoms have been ongoing for 5 days at least have low suspicion for acute life threatening medical illness   They do not want to be admitted to the hospital and want to go back to [facility]  Will try to facilitate this while screening for acute medical illness  Send basic labs and EKG head CT      EKG Interpretation  EKG interpreted independently by Dr. Francis Gaines    Rate: Normal  Rhythm: sinus rhythm  Axis: Left  ST-T Segments: no st elevation  Conduction: No blocks  Impression: Non-specific EKG    No previous EKG on record.  Results     Procedure Component Value Units Date/Time    Comprehensive metabolic panel [[id number]]  (Abnormal) Collected: 05/08/20 1042    Specimen: Blood Updated: 05/08/20 1110     Glucose 346 mg/dL      BUN 11.9 mg/dL      Creatinine 1.6 mg/dL      Sodium 147 mEq/L      Potassium 4.4 mEq/L      Chloride 102 mEq/L      CO2 23 mEq/L      Calcium 10.9 mg/dL      Protein, Total 8.9 g/dL      Albumin 4.8 g/dL      AST (SGOT) 16 U/L      ALT 10 U/L      Alkaline Phosphatase 92 U/L      Bilirubin, Total 0.5 mg/dL      Globulin 4.1 g/dL      Albumin/Globulin Ratio 1.2     Anion Gap 17.0    Magnesium [[id number]] Collected:  05/08/20 1042    Specimen: Blood Updated: 05/08/20 1110     Magnesium 2.1 mg/dL     GFR [[id number]] Collected: 05/08/20 1042     Updated: 05/08/20 1110     EGFR 42.2    CBC and differential [[id number]] Collected: 05/08/20 1042    Specimen: Blood Updated: 05/08/20 1049     WBC 5.36 x10 3/uL      Hgb 14.4 g/dL      Hematocrit 44.0 %      Platelets 239 x10 3/uL      RBC 4.66 x10 6/uL      MCV 94.2 fL      MCH 30.9 pg      MCHC 32.8 g/dL      RDW 15 %      MPV 11.8 fL      Neutrophils 56.4 %      Lymphocytes Automated 29.5 %      Monocytes 9.1 %      Eosinophils Automated 3.9 %      Basophils Automated 0.7 %      Immature Granulocytes 0.4 %      Nucleated RBC 0.0 /100 WBC      Neutrophils Absolute 3.02 x10 3/uL      Lymphocytes Absolute Automated 1.58 x10 3/uL      Monocytes Absolute Automated 0.49 x10 3/uL      Eosinophils Absolute Automated 0.21 x10 3/uL      Basophils Absolute Automated 0.04 x10 3/uL      Immature Granulocytes Absolute 0.02 x10 3/uL      Absolute NRBC 0.00 x10 3/uL     Glucose Whole Blood - POCT [[id number]]  (Abnormal) Collected: 05/08/20 1009     Updated: 05/08/20 1011     Whole Blood Glucose POCT 330 mg/dL         Radiology Results (24 Hour)     Procedure Component Value Units Date/Time    CT Head WO Contrast [[id number]] Collected: 05/08/20 1151    Order Status: Completed Updated: 05/08/20 1155    Narrative:      HISTORY: Altered mental status.    COMPARISON: None     TECHNIQUE: Unenhanced brain CT.     This CT study was performed using radiation dose reduction techniques  including one or more of the following: automated exposure control,  adjustment of the mA and/or kV according to patient's size and the use  of an iterative reconstruction technique.    FINDINGS: There  is mild diffuse parenchymal volume loss. There is  hypodensity in the supratentorial white matter consistent with mild  chronic small vessel ischemic disease. There is no mass effect or an  acute intracranial hemorrhage. The  ventricular system and cisterns are  normally configured. There is patchy mucosal thickening in the ethmoid  air cells. The cranial bones are intact. There are atherosclerotic  calcifications in the distal internal carotid and vertebral arteries.        Impression:       Mild chronic small vessel ischemic disease. Intracranial  atherosclerosis.    Terrilee Croak, MD   05/08/2020 11:53 AM            ED Course as of 05/08/20 1831   Wed May 08, 2020   1104 Police here. Patient was in [place] yesterday and [place] hospital on Saturday in [place]. His brother came to follow him home. Going through [place] his brother got separated from him. Patient said he was fine and could get home so his brother left him. Police spoke with family Vernona Rieger and Annice Pih are driving here [BL]   8119 We were able to speak with Aris [place] patient left AMA yesterday and did not have any testing done [BL]   1300 APS is coming to the ER to speak with them after I spoke to case management   per case management per police patient made it to [place] on Saturday after the family got separated from him he got gas and then got back on the highway and went the [place] [BL]   1643 APS is here to take the patient and his sig other to a hotel for the night. There family is coming tomorrow. I have prescribed his home meds other than elliquis he has no systolic murmur and I am concerned about continuing anticoagulation given his potential recent mental status change. I am also concerned about a potential diagnosis of dementia. I told APS and the patient and his gf that he needs to see his doctor when he gets home they will call and APS will speak with his family about this when they come to get him, Opportunity given to ask questions and all questions asked were answered   [BL]      ED Course User Index  [BL] Nicanor Alcon, MD             Procedures    Clinical Impression & Disposition     Clinical  Impression  Final diagnoses:   Confusion        ED Disposition     ED Disposition Condition Date/Time Comment    Discharge Boarder to Home  Wed May 08, 2020  4:36 PM Admitting Physician: Francis Gaines A [[id number]]   Diagnosis: Confusion [193061]   Estimated Length of Stay: < 2 midnights   Tentative Discharge Plan?: Home or Self Care [1] Comment: APS   Patient Class: Observation [104]             Discharge Medication List as of 05/08/2020  4:36 PM      START taking these medications    Details   !! empagliflozin (Jardiance) 25 MG tablet Take 1 tablet (25 mg total) by mouth every morning for 7 days, Starting Wed 05/08/2020, Until Wed 05/15/2020, E-Rx      !! glimepiride (AMARYL) 1 MG tablet Take 1 tablet (1 mg total) by mouth every morning before breakfast for 7 days, Starting Wed 05/08/2020,  Until Wed 05/15/2020, E-Rx      !! lisinopril (ZESTRIL) 10 MG tablet Take 1 tablet (10 mg total) by mouth daily for 7 days, Starting Wed 05/08/2020, Until Wed 05/15/2020, E-Rx      !! pioglitazone (ACTOS) 15 MG tablet Take 1 tablet (15 mg total) by mouth daily for 7 days, Starting Wed 05/08/2020, Until Wed 05/15/2020, E-Rx       !! - Potential duplicate medications found. Please discuss with provider.                    Nicanor Alcon, MD  05/08/20 [phone]

## 2020-05-08 NOTE — ED Notes (Signed)
Lawrence Gamble- [phone]    Lawrence Gamble    Lawrence Gamble -girlfriend's aunt [phone]

## 2020-05-08 NOTE — Discharge Instructions (Signed)
You have been seen and evaluated because you got lost.  It is very important that you see your primary care doctor when you get back home.  Ask your doctor why you are taking Eliquis.  I do not feel comfortable writing you for this medication not knowing why you are taking it when it is clear that you have had a recent change in your behavior.  Please return to the emergency department if you have any new or worsening concerns or symptoms.    CNS Symptoms NOS (Obs)    You have symptoms that seem like they involve your nervous system.    Symptoms that involve the nervous system may or may not happen along with a headache. Symptoms may include:   Numbness: This is when a part of your body feels like it is asleep. This feeling might be like pins and needles or tingling. Some say it feels like the arm or leg is asleep.   Weakness: This is different from feeling tired or fatigued. It may feel like your arm or leg or one half of your face is not working right. It may feel like your leg cant support your weight. It might feel like your hand is weak. You might have trouble picking up or holding onto things. One side of your face might droop. You may not be able to raise the corners of your mouth to smile.   Coordination or balance problems: This is called ataxia. It looks like how a very intoxicated (drunk) person might move. There may also be problems with balance or falling to one side.   Confusion: It may suddenly seem your thoughts are not clear. You may suddenly have problems with your short-term memory.   Speech problems: This is called expressive aphasia. You may have trouble getting words out. You know what you want to say. However, you cant make the words come out. You may have other speech problems. This includes slurring your words. Slurring can sound like how an intoxicated (drunk) person talks.    The list of problems described above is not complete. There are many other symptoms that can seem  like they are from a nervous system problem.     The doctor made many complex choices about how to figure out your problem. The doctor may decide to do special tests. He or she may want to talk about your case with another doctor. This will depend on many different things. This includes the type of symptoms you have. You may need to see a specialist. This could be with a neurologist (brain and nervous system specialist), for example. You may have had special tests like a CT scan or MRI. You may have had an ultrasound of your neck or heart arteries. You may have had a spinal tap. If you had these tests or saw a specialist, the doctor will talk to you about this.    You had an extended stay in an observation unit. Medical problems can be very complex. This can be especially true if they involve the brain. It can be very hard to figure some conditions out. It can be hard even after many tests. Even after an observation stay, re-exams and repeat testing, you may still have a serious medical problem.     After your stay, the doctor thinks it is OK for you to go home. You will get more instructions about the problem you came in for.    See your regular or referral doctor.  The medical staff here will give you instructions about this. You may have scheduled tests. These could be for an ultrasound, MRI, etc. If so, you need to have the tests done.    Come to the Emergency Department again if your symptoms get worse or you think you still have a serious medical problem.    YOU SHOULD SEEK MEDICAL ATTENTION IMMEDIATELY, EITHER HERE OR AT THE NEAREST EMERGENCY DEPARTMENT, IF ANY OF THE FOLLOWING OCCURS:   Numbness (loss of feeling) or tingling (pins and needles feeling) in your arm, leg or face.   Any part is weak or has paralysis (cannot move).   Coordination or balance problems.   Bad dizziness (spinning sensation).   You have trouble speaking or can't express yourself all of a sudden.   Trouble  swallowing.   Change in how alert you generally are.   Severe headache.

## 2020-06-02 ENCOUNTER — Other Ambulatory Visit: Payer: Self-pay

## 2020-06-02 ENCOUNTER — Encounter: Payer: Self-pay | Admitting: Emergency Medicine

## 2020-06-02 ENCOUNTER — Emergency Department
Admission: EM | Admit: 2020-06-02 | Discharge: 2020-06-02 | Disposition: A | Discharge status: Home / Self Care | Payer: Medicare Other | Attending: Student in an Organized Health Care Education/Training Program | Admitting: Student in an Organized Health Care Education/Training Program

## 2020-06-02 DIAGNOSIS — I129 Hypertensive chronic kidney disease with stage 1 through stage 4 chronic kidney disease, or unspecified chronic kidney disease: Secondary | ICD-10-CM | POA: Insufficient documentation

## 2020-06-02 DIAGNOSIS — E1122 Type 2 diabetes mellitus with diabetic chronic kidney disease: Secondary | ICD-10-CM | POA: Insufficient documentation

## 2020-06-02 DIAGNOSIS — N183 Chronic kidney disease, stage 3 unspecified: Secondary | ICD-10-CM | POA: Insufficient documentation

## 2020-06-02 DIAGNOSIS — Z7984 Long term (current) use of oral hypoglycemic drugs: Secondary | ICD-10-CM | POA: Diagnosis not present

## 2020-06-02 DIAGNOSIS — Z79899 Other long term (current) drug therapy: Secondary | ICD-10-CM | POA: Insufficient documentation

## 2020-06-02 DIAGNOSIS — Z7901 Long term (current) use of anticoagulants: Secondary | ICD-10-CM | POA: Insufficient documentation

## 2020-06-02 DIAGNOSIS — N492 Inflammatory disorders of scrotum: Secondary | ICD-10-CM | POA: Diagnosis present

## 2020-06-02 DIAGNOSIS — E1165 Type 2 diabetes mellitus with hyperglycemia: Secondary | ICD-10-CM | POA: Insufficient documentation

## 2020-06-02 DIAGNOSIS — L02214 Cutaneous abscess of groin: Secondary | ICD-10-CM | POA: Diagnosis not present

## 2020-06-02 DIAGNOSIS — F17200 Nicotine dependence, unspecified, uncomplicated: Secondary | ICD-10-CM | POA: Diagnosis not present

## 2020-06-02 LAB — CBC
HCT: 38 % — ABNORMAL LOW (ref 39.0–52.0)
Hemoglobin: 12.5 g/dL — ABNORMAL LOW (ref 13.0–17.0)
MCH: 31.8 pg (ref 26.0–34.0)
MCHC: 32.9 g/dL (ref 30.0–36.0)
MCV: 96.7 fL (ref 80.0–100.0)
Platelets: 210 10*3/uL (ref 150–400)
RBC: 3.93 MIL/uL — ABNORMAL LOW (ref 4.22–5.81)
RDW: 15 % (ref 11.5–15.5)
WBC: 5.5 10*3/uL (ref 4.0–10.5)
nRBC: 0 % (ref 0.0–0.2)

## 2020-06-02 LAB — COMPREHENSIVE METABOLIC PANEL
ALT: 8 U/L (ref 0–44)
AST: 15 U/L (ref 15–41)
Albumin: 4.1 g/dL (ref 3.5–5.0)
Alkaline Phosphatase: 76 U/L (ref 38–126)
Anion gap: 9 (ref 5–15)
BUN: 20 mg/dL (ref 8–23)
CO2: 22 mmol/L (ref 22–32)
Calcium: 9.4 mg/dL (ref 8.9–10.3)
Chloride: 104 mmol/L (ref 98–111)
Creatinine, Ser: 1.32 mg/dL — ABNORMAL HIGH (ref 0.61–1.24)
GFR, Estimated: 56 mL/min — ABNORMAL LOW (ref >60)
Glucose, Bld: 318 mg/dL — ABNORMAL HIGH (ref 70–99)
Potassium: 4.3 mmol/L (ref 3.5–5.1)
Sodium: 135 mmol/L (ref 135–145)
Total Bilirubin: 0.6 mg/dL (ref 0.3–1.2)
Total Protein: 7.6 g/dL (ref 6.5–8.1)

## 2020-06-02 MED ORDER — DOXYCYCLINE HYCLATE 100 MG PO CAPS: 100.0000 mg | ORAL_CAPSULE | Freq: Two times a day (BID) | ORAL | 0 refills | Status: DC

## 2020-06-02 MED ORDER — DOXYCYCLINE HYCLATE 100 MG PO CAPS
100.0000 mg | ORAL_CAPSULE | Freq: Two times a day (BID) | ORAL | 0 refills | Status: DC
Start: 2020-06-02 — End: 2022-07-09
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
## 2020-06-02 NOTE — ED Notes (Signed)
Patient c/o scrotal pain and drainage. Drainage is yellow in color.

## 2020-06-02 NOTE — ED Triage Notes (Signed)
Pt to ER with c/o abscess to scrotum.  States it is open and draining.  Denies redness or pain.  States has been there approximately 1 week.  Pt has hx of diabetes.  Pt states has hx of same but this time is draining and not healing.

## 2020-06-02 NOTE — Discharge Instructions (Signed)
Please follow up with primary care for symptoms that are not improving over the next few days.  Return to the ER for symptoms that change or worsen if unable to schedule an appointment.

## 2020-06-03 LAB — CBG MONITORING, ED: Glucose-Capillary: 285 mg/dL — ABNORMAL HIGH (ref 70–99)

## 2020-08-05 ENCOUNTER — Other Ambulatory Visit (INDEPENDENT_AMBULATORY_CARE_PROVIDER_SITE_OTHER): Payer: Self-pay | Admitting: Nurse Practitioner

## 2020-08-05 DIAGNOSIS — I82411 Acute embolism and thrombosis of right femoral vein: Secondary | ICD-10-CM

## 2020-10-28 ENCOUNTER — Other Ambulatory Visit: Payer: Self-pay

## 2020-10-28 ENCOUNTER — Encounter: Payer: Self-pay | Admitting: Emergency Medicine

## 2020-10-28 ENCOUNTER — Emergency Department
Admission: EM | Admit: 2020-10-28 | Discharge: 2020-10-28 | Discharge status: Against Medical Advice | Payer: Medicare Other | Attending: Emergency Medicine | Admitting: Emergency Medicine

## 2020-10-28 ENCOUNTER — Emergency Department: Payer: Medicare Other

## 2020-10-28 DIAGNOSIS — I129 Hypertensive chronic kidney disease with stage 1 through stage 4 chronic kidney disease, or unspecified chronic kidney disease: Secondary | ICD-10-CM | POA: Diagnosis not present

## 2020-10-28 DIAGNOSIS — R4182 Altered mental status, unspecified: Secondary | ICD-10-CM | POA: Diagnosis present

## 2020-10-28 DIAGNOSIS — Z79899 Other long term (current) drug therapy: Secondary | ICD-10-CM | POA: Insufficient documentation

## 2020-10-28 DIAGNOSIS — N183 Chronic kidney disease, stage 3 unspecified: Secondary | ICD-10-CM | POA: Diagnosis not present

## 2020-10-28 DIAGNOSIS — E1122 Type 2 diabetes mellitus with diabetic chronic kidney disease: Secondary | ICD-10-CM | POA: Insufficient documentation

## 2020-10-28 DIAGNOSIS — H538 Other visual disturbances: Secondary | ICD-10-CM | POA: Insufficient documentation

## 2020-10-28 DIAGNOSIS — Z7984 Long term (current) use of oral hypoglycemic drugs: Secondary | ICD-10-CM | POA: Insufficient documentation

## 2020-10-28 DIAGNOSIS — F17200 Nicotine dependence, unspecified, uncomplicated: Secondary | ICD-10-CM | POA: Insufficient documentation

## 2020-10-28 LAB — CBC
HCT: 35.5 % — ABNORMAL LOW (ref 39.0–52.0)
Hemoglobin: 12.3 g/dL — ABNORMAL LOW (ref 13.0–17.0)
MCH: 33.6 pg (ref 26.0–34.0)
MCHC: 34.6 g/dL (ref 30.0–36.0)
MCV: 97 fL (ref 80.0–100.0)
Platelets: 213 10*3/uL (ref 150–400)
RBC: 3.66 MIL/uL — ABNORMAL LOW (ref 4.22–5.81)
RDW: 13.2 % (ref 11.5–15.5)
WBC: 4.6 10*3/uL (ref 4.0–10.5)
nRBC: 0 % (ref 0.0–0.2)

## 2020-10-28 LAB — URINALYSIS, COMPLETE (UACMP) WITH MICROSCOPIC
Bacteria, UA: NONE SEEN
Bilirubin Urine: NEGATIVE
Glucose, UA: 50 mg/dL — AB
Hgb urine dipstick: NEGATIVE
Ketones, ur: NEGATIVE mg/dL
Nitrite: NEGATIVE
Protein, ur: NEGATIVE mg/dL
Specific Gravity, Urine: 1.016 (ref 1.005–1.030)
pH: 5 (ref 5.0–8.0)

## 2020-10-28 LAB — COMPREHENSIVE METABOLIC PANEL
ALT: 11 U/L (ref 0–44)
AST: 18 U/L (ref 15–41)
Albumin: 4.2 g/dL (ref 3.5–5.0)
Alkaline Phosphatase: 46 U/L (ref 38–126)
Anion gap: 7 (ref 5–15)
BUN: 17 mg/dL (ref 8–23)
CO2: 21 mmol/L — ABNORMAL LOW (ref 22–32)
Calcium: 9.6 mg/dL (ref 8.9–10.3)
Chloride: 111 mmol/L (ref 98–111)
Creatinine, Ser: 1.08 mg/dL (ref 0.61–1.24)
GFR, Estimated: >60 mL/min (ref >60)
Glucose, Bld: 159 mg/dL — ABNORMAL HIGH (ref 70–99)
Potassium: 4.1 mmol/L (ref 3.5–5.1)
Sodium: 139 mmol/L (ref 135–145)
Total Bilirubin: 0.9 mg/dL (ref 0.3–1.2)
Total Protein: 7.6 g/dL (ref 6.5–8.1)

## 2020-10-28 LAB — DIFFERENTIAL
Abs Immature Granulocytes: 0.01 10*3/uL (ref 0.00–0.07)
Basophils Absolute: 0 10*3/uL (ref 0.0–0.1)
Basophils Relative: 0 %
Eosinophils Absolute: 0.2 10*3/uL (ref 0.0–0.5)
Eosinophils Relative: 3 %
Immature Granulocytes: 0 %
Lymphocytes Relative: 32 %
Lymphs Abs: 1.5 10*3/uL (ref 0.7–4.0)
Monocytes Absolute: 0.4 10*3/uL (ref 0.1–1.0)
Monocytes Relative: 9 %
Neutro Abs: 2.6 10*3/uL (ref 1.7–7.7)
Neutrophils Relative %: 56 %

## 2020-10-28 LAB — CBG MONITORING, ED: Glucose-Capillary: 148 mg/dL — ABNORMAL HIGH (ref 70–99)

## 2020-10-28 LAB — PROTIME-INR
INR: 1.2 (ref 0.8–1.2)
Prothrombin Time: 15.4 seconds — ABNORMAL HIGH (ref 11.4–15.2)

## 2020-10-28 LAB — APTT: aPTT: 37 seconds — ABNORMAL HIGH (ref 24–36)

## 2020-10-28 MED ORDER — CIPROFLOXACIN HCL 500 MG PO TABS
500.0000 mg | ORAL_TABLET | Freq: Once | ORAL | Status: AC
  Administered 2020-10-28: 500 mg via ORAL
  Filled 2020-10-28: qty 1

## 2020-10-28 NOTE — ED Notes (Signed)
Pt walking out of room several times stating he wants to go home. Dr. Vicente Males states pt can leave AMA if he has a ride home. Called patient's bother, BJ, who is coming to pick patient up. Pt signed AMA form. Walked with patient to lobby to wait for his brother.

## 2020-10-28 NOTE — ED Notes (Signed)
Pt offered food and declined. Pt refuses vitals at this time.

## 2020-10-28 NOTE — ED Notes (Signed)
Pt presents to ED for confusion, pt thinks he is [facility] and refuses to answer his orientation questions and appears agitated at this time. Pt is disrespectful and rude, pt asked for UA and UA was sent to lab. NAD noted. Pt states 'there is nothing wrong with me and I am about walk out of here and I will not answer anymore questions.   Pt refuses to let this RN take vitals signs at this time.

## 2020-10-28 NOTE — ED Notes (Signed)
Pt came out of room stating he needs to leave and to call his brother, Pt redirected back to his room at this time.
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
## 2020-10-28 NOTE — ED Notes (Signed)
Resumed care from [facility], pt states he is ready to go.  Pt refuses to have vital signs taken.  Pt standing in room.

## 2020-10-28 NOTE — ED Triage Notes (Signed)
Pt to ED from [facility] with c/o AMS and double vision. Pt states double vision x 1 week. Pt states year is 21, the month is 2012, pt unable to state where is at this time.

## 2020-10-29 ENCOUNTER — Telehealth: Payer: Self-pay | Admitting: Physician Assistant

## 2020-10-29 MED ORDER — CIPROFLOXACIN HCL 500 MG PO TABS: 500.0000 mg | ORAL_TABLET | Freq: Two times a day (BID) | ORAL | 0 refills | Status: AC

## 2020-10-29 NOTE — Telephone Encounter (Cosign Needed)
Patient signed out AMA last night.  Patient did have a UTI which may have caused some of the altered mental status.  I spoke with his brother this morning.  Sent in prescription for Cipro to the pharmacy.  He states he will get it and have his brother take the medication.  They are to return if he is worsening.

## 2020-10-30 LAB — URINE CULTURE: Culture: <10000 — AB

## 2021-03-10 ENCOUNTER — Other Ambulatory Visit (INDEPENDENT_AMBULATORY_CARE_PROVIDER_SITE_OTHER): Payer: Self-pay | Admitting: Nurse Practitioner

## 2021-03-10 DIAGNOSIS — I82411 Acute embolism and thrombosis of right femoral vein: Secondary | ICD-10-CM

## 2021-03-13 NOTE — Telephone Encounter (Signed)
Last seen 10/2019 and no show 12/2019 should we refill

## 2021-04-04 ENCOUNTER — Other Ambulatory Visit (INDEPENDENT_AMBULATORY_CARE_PROVIDER_SITE_OTHER): Payer: Self-pay | Admitting: Nurse Practitioner

## 2021-04-04 DIAGNOSIS — I82411 Acute embolism and thrombosis of right femoral vein: Secondary | ICD-10-CM

## 2021-04-21 ENCOUNTER — Other Ambulatory Visit (INDEPENDENT_AMBULATORY_CARE_PROVIDER_SITE_OTHER): Payer: Self-pay | Admitting: Nurse Practitioner

## 2021-04-21 DIAGNOSIS — I82411 Acute embolism and thrombosis of right femoral vein: Secondary | ICD-10-CM

## 2021-04-21 MED ORDER — APIXABAN 5 MG PO TABS: ORAL_TABLET | ORAL | 5 refills | Status: DC

## 2021-08-08 IMAGING — US US EXTREM LOW VENOUS
1 series · 13 of 24 positions shown · non-contrast
Comparison: None.

CLINICAL DATA: Leg swelling



[Series 1: us extrem low venous · 0.08mm/px · 13 of 63 slices shown]
[im 1/63]
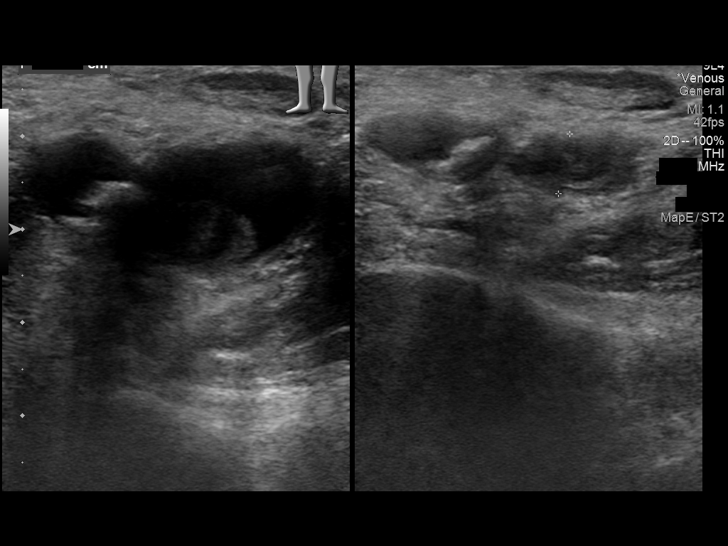
[im 6/63]
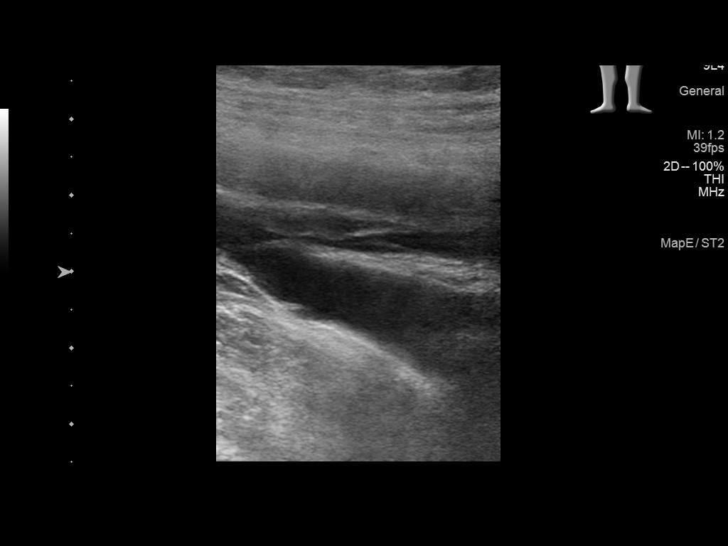
[im 11/63]
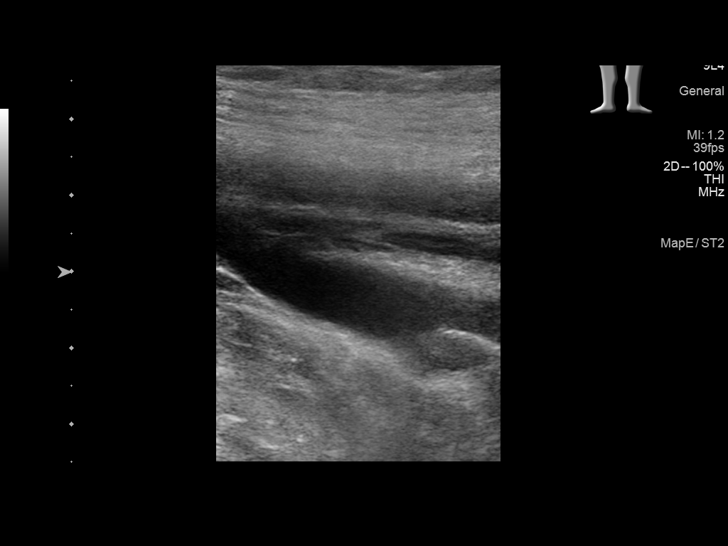
[im 17/63]
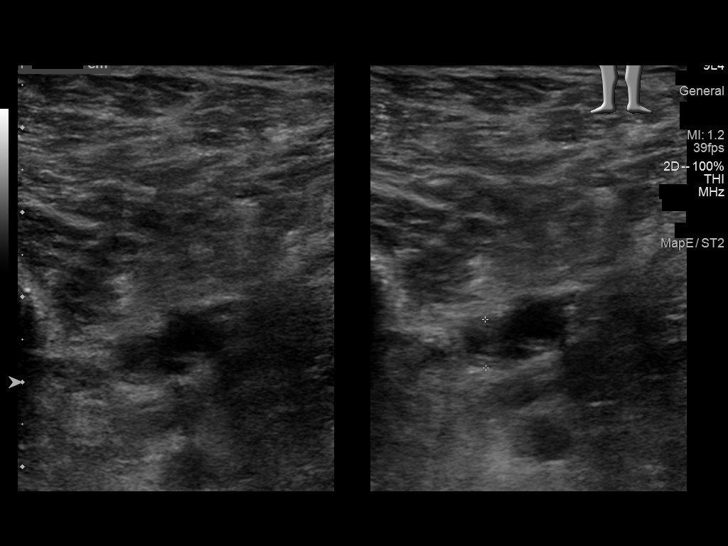
[im 22/63]
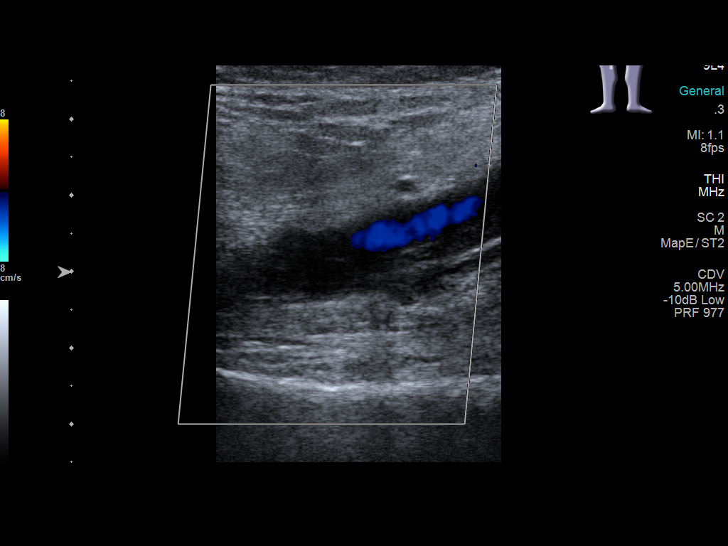
[im 27/63]
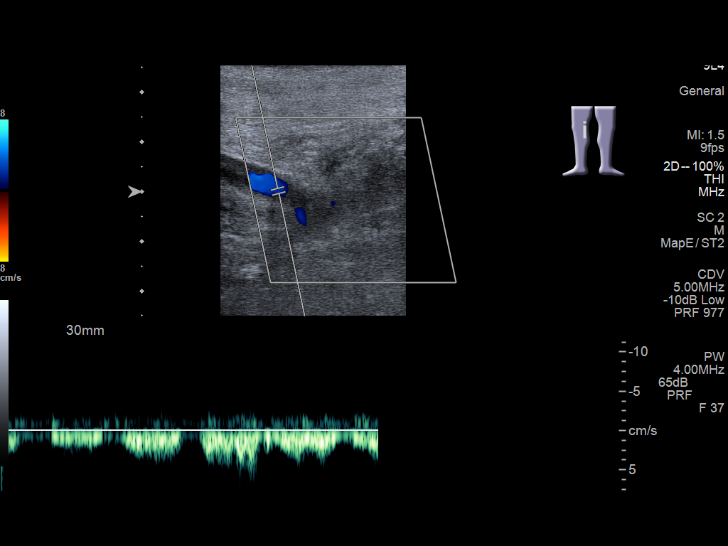
[im 33/63]
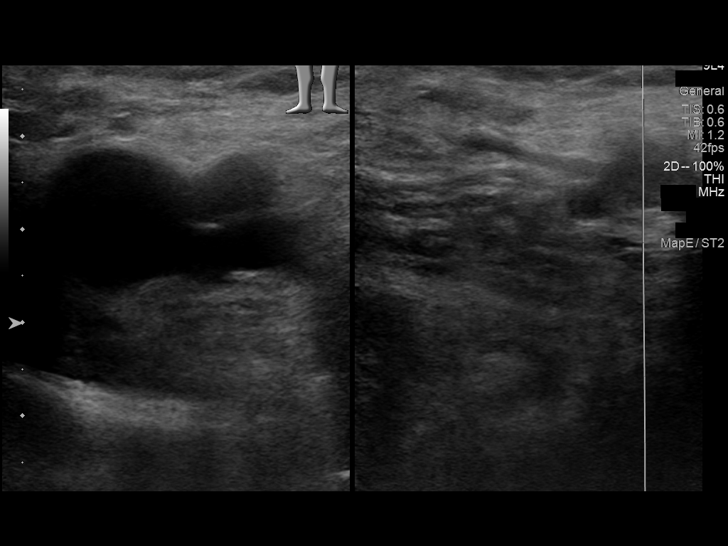
[im 36/63]
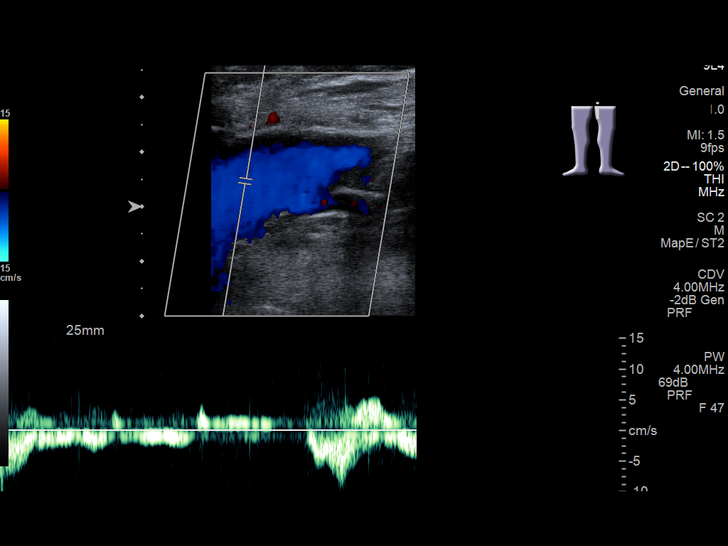
[im 41/63]
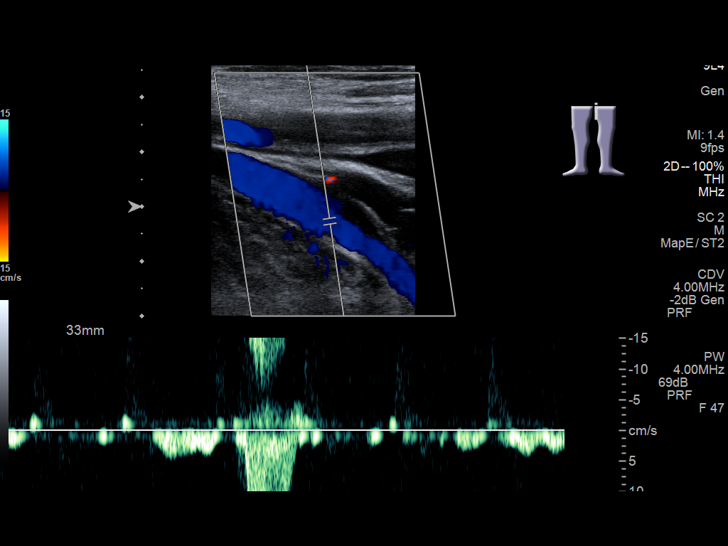
[im 46/63]
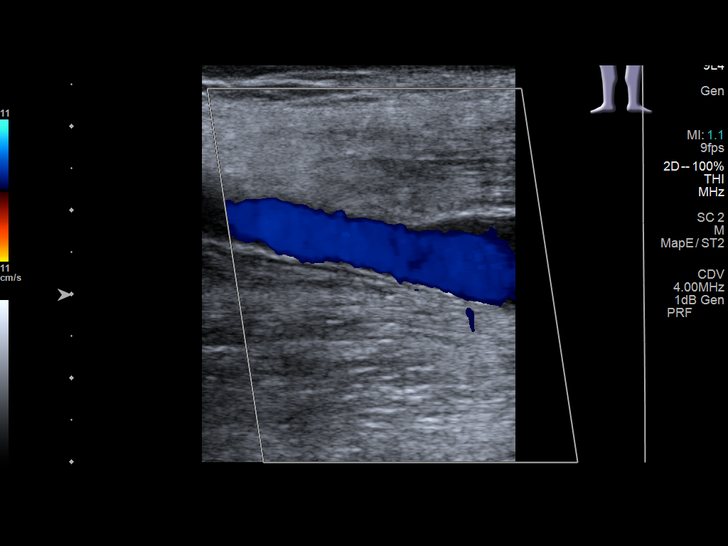
[im 52/63]
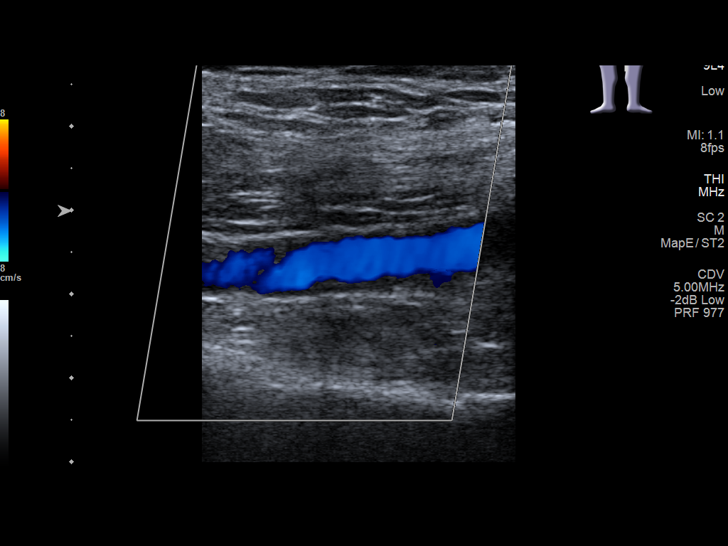
[im 57/63]
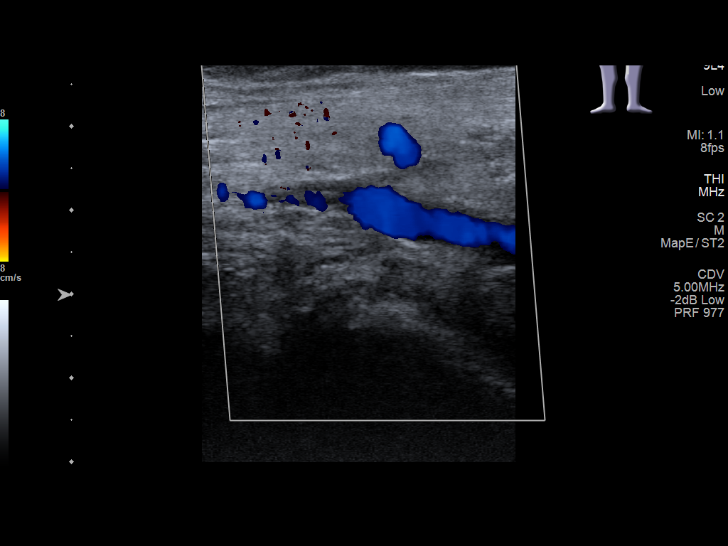
[im 63/63]
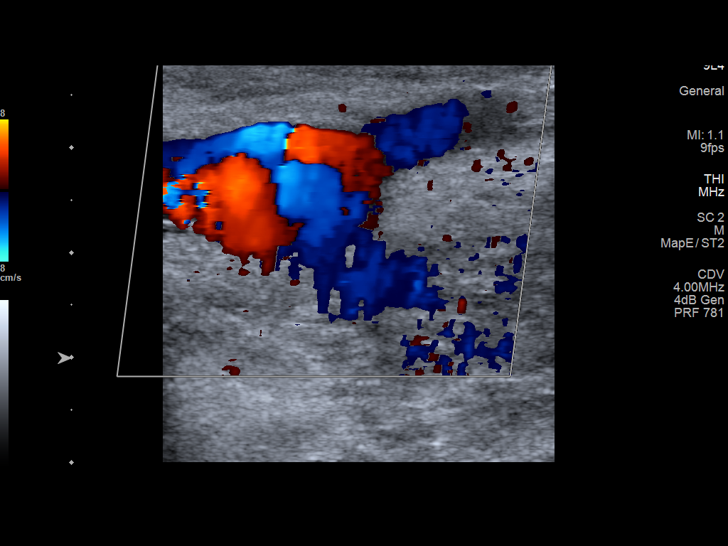

[13 of 24 positions shown; findings below may reference images not displayed]

FINDINGS: RIGHT LOWER EXTREMITY

Common Femoral Vein: Occlusive thrombus

Saphenofemoral Junction: Occlusive thrombus

Profunda Femoral Vein: No evidence of thrombus. Normal
compressibility and flow on color Doppler imaging.

Femoral Vein: Occlusive thrombus

Popliteal Vein: Occlusive thrombus

Calf Veins: Suboptimal views. Posterior tibial vein appears patent.
Concern for thrombus in the peroneal vein.

Superficial Great Saphenous Vein: No evidence of thrombus. Normal
compressibility.

Venous Reflux:  Not assessed

Other Findings:  None.

LEFT LOWER EXTREMITY

Common Femoral Vein: No evidence of thrombus. Normal
compressibility, respiratory phasicity and response to augmentation.

Saphenofemoral Junction: No evidence of thrombus. Normal
compressibility and flow on color Doppler imaging.

Profunda Femoral Vein: No evidence of thrombus. Normal
compressibility and flow on color Doppler imaging.

Femoral Vein: No evidence of thrombus. Normal compressibility,
respiratory phasicity and response to augmentation.

Popliteal Vein: Partially occlusive possibly chronic thrombus in the
left popliteal vein.

Calf Veins: Suboptimal visualization.

Superficial Great Saphenous Vein: No evidence of thrombus. Normal
compressibility.

Venous Reflux:  Not assessed

Other Findings:  None.
IMPRESSION: Occlusive thrombus on the right from the common femoral vein through
the popliteal vein and possibly also involving the right calf
peroneal veins.

Partially occlusive thrombus in the left popliteal vein, possibly
old/chronic.

## 2021-08-08 IMAGING — DX DG CHEST 1V
1 series · 1 of 1 positions shown · non-contrast
Comparison: May 16, 2017

CLINICAL DATA: Swelling in his feet.

EXAM:
CHEST  1 VIEW

[chest ap]
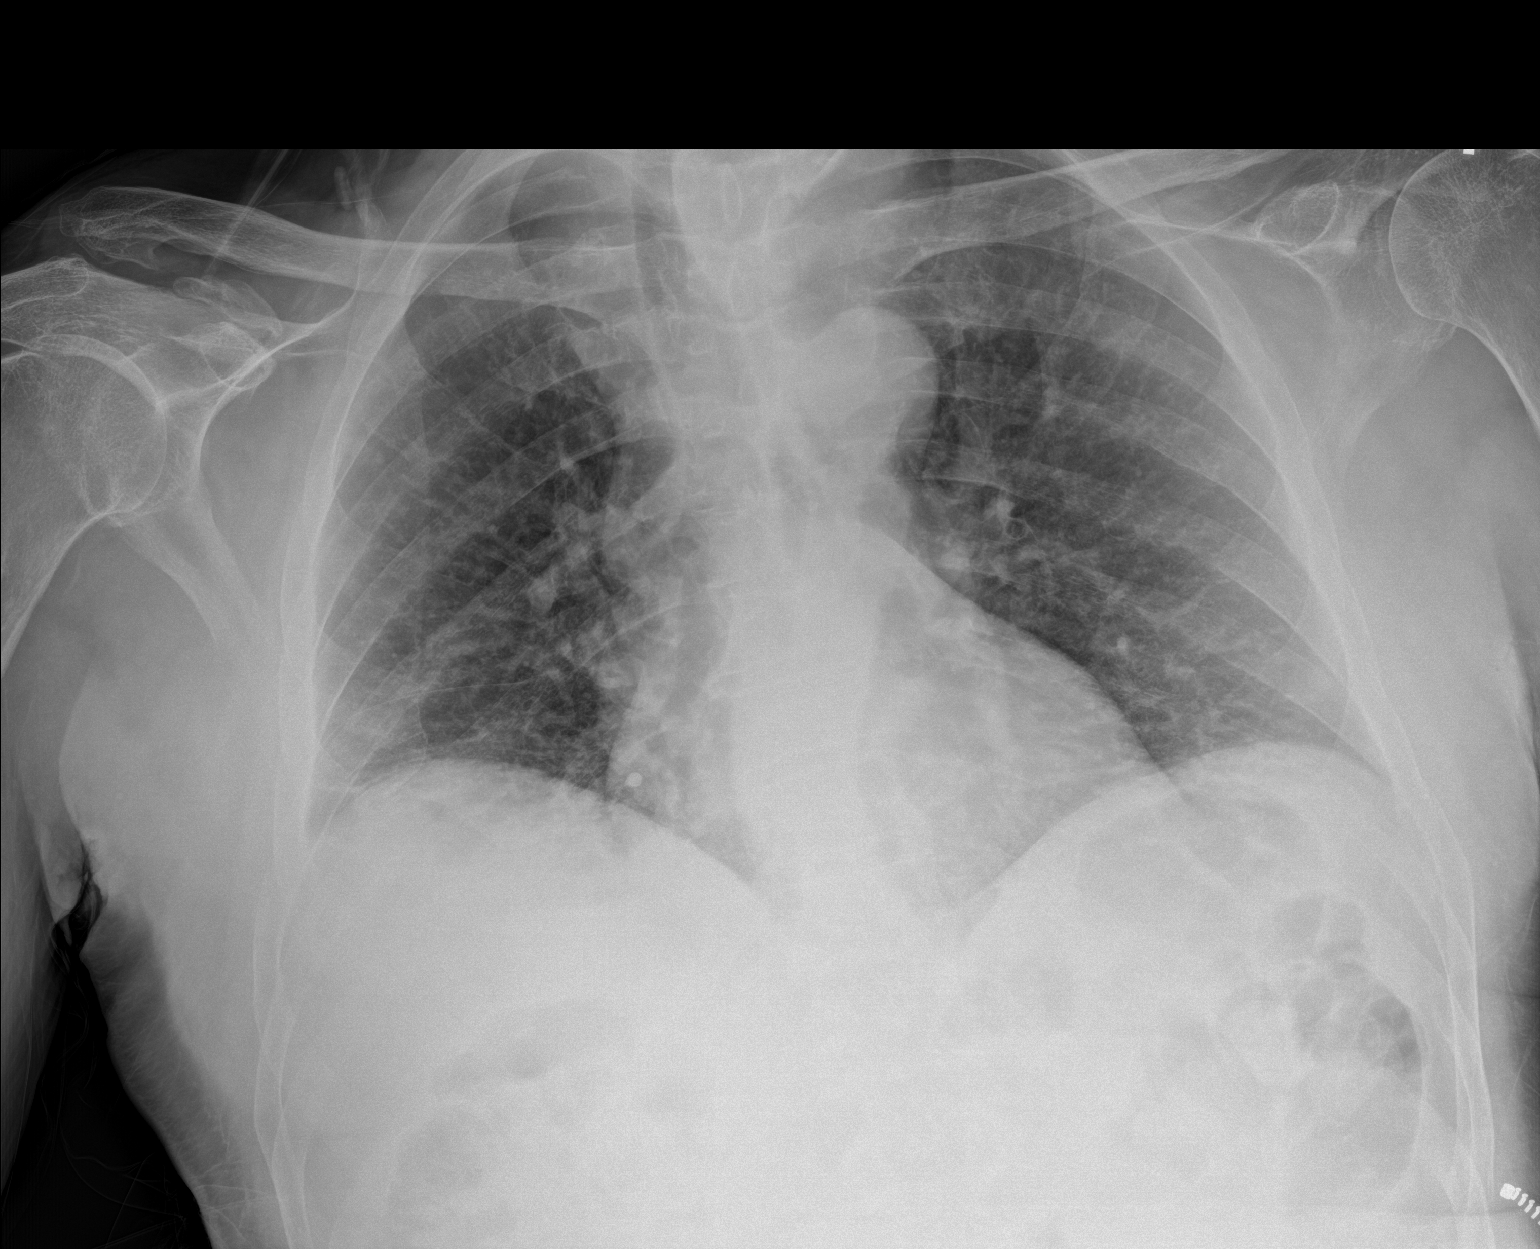

[1 of 1 positions shown; findings below may reference images not displayed]

FINDINGS: Mild, chronic appearing increased lung markings are seen without
evidence of acute infiltrate, pleural effusion or pneumothorax. The
heart size and mediastinal contours are within normal limits. There
is tortuosity of the descending thoracic aorta. The visualized
skeletal structures are unremarkable.
IMPRESSION: Chronic appearing increased lung markings without evidence of acute
or active cardiopulmonary disease.

## 2022-06-17 ENCOUNTER — Encounter: Payer: Self-pay | Admitting: *Deleted

## 2022-06-17 ENCOUNTER — Emergency Department: Payer: Medicare HMO

## 2022-06-17 ENCOUNTER — Inpatient Hospital Stay
Admission: EM | Admit: 2022-06-17 | Discharge: 2022-07-23 | DRG: 641 | Disposition: A | Discharge status: Nursing Home (Includes ICF) | Payer: Medicare HMO | Attending: Student | Admitting: Student

## 2022-06-17 ENCOUNTER — Other Ambulatory Visit: Payer: Self-pay

## 2022-06-17 DIAGNOSIS — R4182 Altered mental status, unspecified: Secondary | ICD-10-CM

## 2022-06-17 DIAGNOSIS — N179 Acute kidney failure, unspecified: Secondary | ICD-10-CM | POA: Diagnosis present

## 2022-06-17 DIAGNOSIS — R9431 Abnormal electrocardiogram [ECG] [EKG]: Secondary | ICD-10-CM | POA: Diagnosis present

## 2022-06-17 DIAGNOSIS — Z1152 Encounter for screening for COVID-19: Secondary | ICD-10-CM

## 2022-06-17 DIAGNOSIS — E11649 Type 2 diabetes mellitus with hypoglycemia without coma: Secondary | ICD-10-CM | POA: Diagnosis present

## 2022-06-17 DIAGNOSIS — I10 Essential (primary) hypertension: Secondary | ICD-10-CM | POA: Diagnosis present

## 2022-06-17 DIAGNOSIS — Z6822 Body mass index (BMI) 22.0-22.9, adult: Secondary | ICD-10-CM

## 2022-06-17 DIAGNOSIS — R739 Hyperglycemia, unspecified: Secondary | ICD-10-CM

## 2022-06-17 DIAGNOSIS — Z86718 Personal history of other venous thrombosis and embolism: Secondary | ICD-10-CM

## 2022-06-17 DIAGNOSIS — R197 Diarrhea, unspecified: Secondary | ICD-10-CM | POA: Diagnosis not present

## 2022-06-17 DIAGNOSIS — A09 Infectious gastroenteritis and colitis, unspecified: Secondary | ICD-10-CM | POA: Diagnosis present

## 2022-06-17 DIAGNOSIS — F03918 Unspecified dementia, unspecified severity, with other behavioral disturbance: Secondary | ICD-10-CM | POA: Diagnosis present

## 2022-06-17 DIAGNOSIS — K649 Unspecified hemorrhoids: Secondary | ICD-10-CM | POA: Diagnosis present

## 2022-06-17 DIAGNOSIS — Z79899 Other long term (current) drug therapy: Secondary | ICD-10-CM

## 2022-06-17 DIAGNOSIS — W19XXXA Unspecified fall, initial encounter: Secondary | ICD-10-CM

## 2022-06-17 DIAGNOSIS — E860 Dehydration: Principal | ICD-10-CM | POA: Diagnosis present

## 2022-06-17 DIAGNOSIS — E785 Hyperlipidemia, unspecified: Secondary | ICD-10-CM | POA: Diagnosis present

## 2022-06-17 DIAGNOSIS — S0990XA Unspecified injury of head, initial encounter: Secondary | ICD-10-CM

## 2022-06-17 DIAGNOSIS — E538 Deficiency of other specified B group vitamins: Secondary | ICD-10-CM | POA: Diagnosis present

## 2022-06-17 DIAGNOSIS — R413 Other amnesia: Secondary | ICD-10-CM | POA: Diagnosis not present

## 2022-06-17 DIAGNOSIS — F17210 Nicotine dependence, cigarettes, uncomplicated: Secondary | ICD-10-CM | POA: Diagnosis present

## 2022-06-17 DIAGNOSIS — R630 Anorexia: Secondary | ICD-10-CM | POA: Diagnosis present

## 2022-06-17 DIAGNOSIS — Z7984 Long term (current) use of oral hypoglycemic drugs: Secondary | ICD-10-CM

## 2022-06-17 DIAGNOSIS — G4700 Insomnia, unspecified: Secondary | ICD-10-CM | POA: Diagnosis present

## 2022-06-17 DIAGNOSIS — N401 Enlarged prostate with lower urinary tract symptoms: Secondary | ICD-10-CM | POA: Diagnosis not present

## 2022-06-17 DIAGNOSIS — E1165 Type 2 diabetes mellitus with hyperglycemia: Secondary | ICD-10-CM | POA: Diagnosis not present

## 2022-06-17 DIAGNOSIS — Z8711 Personal history of peptic ulcer disease: Secondary | ICD-10-CM

## 2022-06-17 DIAGNOSIS — Z794 Long term (current) use of insulin: Secondary | ICD-10-CM

## 2022-06-17 DIAGNOSIS — Z66 Do not resuscitate: Secondary | ICD-10-CM | POA: Diagnosis present

## 2022-06-17 DIAGNOSIS — R296 Repeated falls: Secondary | ICD-10-CM | POA: Diagnosis present

## 2022-06-17 DIAGNOSIS — E1140 Type 2 diabetes mellitus with diabetic neuropathy, unspecified: Secondary | ICD-10-CM | POA: Diagnosis present

## 2022-06-17 DIAGNOSIS — F0390 Unspecified dementia without behavioral disturbance: Secondary | ICD-10-CM | POA: Diagnosis present

## 2022-06-17 DIAGNOSIS — I82411 Acute embolism and thrombosis of right femoral vein: Secondary | ICD-10-CM

## 2022-06-17 DIAGNOSIS — R338 Other retention of urine: Secondary | ICD-10-CM | POA: Diagnosis not present

## 2022-06-17 DIAGNOSIS — Z833 Family history of diabetes mellitus: Secondary | ICD-10-CM

## 2022-06-17 DIAGNOSIS — Z7901 Long term (current) use of anticoagulants: Secondary | ICD-10-CM

## 2022-06-17 DIAGNOSIS — Z751 Person awaiting admission to adequate facility elsewhere: Secondary | ICD-10-CM

## 2022-06-17 LAB — COMPREHENSIVE METABOLIC PANEL
ALT: 24 U/L (ref 0–44)
AST: 30 U/L (ref 15–41)
Albumin: 4.1 g/dL (ref 3.5–5.0)
Alkaline Phosphatase: 112 U/L (ref 38–126)
Anion gap: 13 (ref 5–15)
BUN: 22 mg/dL (ref 8–23)
CO2: 20 mmol/L — ABNORMAL LOW (ref 22–32)
Calcium: 9.4 mg/dL (ref 8.9–10.3)
Chloride: 100 mmol/L (ref 98–111)
Creatinine, Ser: 1.11 mg/dL (ref 0.61–1.24)
GFR, Estimated: >60 mL/min (ref >60)
Glucose, Bld: 511 mg/dL (ref 70–99)
Potassium: 4.2 mmol/L (ref 3.5–5.1)
Sodium: 133 mmol/L — ABNORMAL LOW (ref 135–145)
Total Bilirubin: 0.7 mg/dL (ref 0.3–1.2)
Total Protein: 7.3 g/dL (ref 6.5–8.1)

## 2022-06-17 LAB — MAGNESIUM: Magnesium: 1.9 mg/dL (ref 1.7–2.4)

## 2022-06-17 LAB — CBC WITH DIFFERENTIAL/PLATELET
Abs Immature Granulocytes: 0.01 10*3/uL (ref 0.00–0.07)
Basophils Absolute: 0 10*3/uL (ref 0.0–0.1)
Basophils Relative: 0 %
Eosinophils Absolute: 0.1 10*3/uL (ref 0.0–0.5)
Eosinophils Relative: 1 %
HCT: 38.6 % — ABNORMAL LOW (ref 39.0–52.0)
Hemoglobin: 12.9 g/dL — ABNORMAL LOW (ref 13.0–17.0)
Immature Granulocytes: 0 %
Lymphocytes Relative: 26 %
Lymphs Abs: 1.3 10*3/uL (ref 0.7–4.0)
MCH: 33.9 pg (ref 26.0–34.0)
MCHC: 33.4 g/dL (ref 30.0–36.0)
MCV: 101.3 fL — ABNORMAL HIGH (ref 80.0–100.0)
Monocytes Absolute: 0.3 10*3/uL (ref 0.1–1.0)
Monocytes Relative: 7 %
Neutro Abs: 3.1 10*3/uL (ref 1.7–7.7)
Neutrophils Relative %: 66 %
Platelets: 238 10*3/uL (ref 150–400)
RBC: 3.81 MIL/uL — ABNORMAL LOW (ref 4.22–5.81)
RDW: 12.7 % (ref 11.5–15.5)
WBC: 4.8 10*3/uL (ref 4.0–10.5)
nRBC: 0 % (ref 0.0–0.2)

## 2022-06-17 LAB — URINALYSIS, ROUTINE W REFLEX MICROSCOPIC
Bacteria, UA: NONE SEEN
Bilirubin Urine: NEGATIVE
Glucose, UA: >=500 mg/dL — AB
Hgb urine dipstick: NEGATIVE
Ketones, ur: NEGATIVE mg/dL
Leukocytes,Ua: NEGATIVE
Nitrite: NEGATIVE
Protein, ur: NEGATIVE mg/dL
Specific Gravity, Urine: 1.026 (ref 1.005–1.030)
pH: 5 (ref 5.0–8.0)

## 2022-06-17 LAB — CBG MONITORING, ED: Glucose-Capillary: 391 mg/dL — ABNORMAL HIGH (ref 70–99)

## 2022-06-17 LAB — TROPONIN I (HIGH SENSITIVITY)
Troponin I (High Sensitivity): 5 ng/L (ref <18)
Troponin I (High Sensitivity): 7 ng/L (ref <18)

## 2022-06-17 MED ORDER — ATORVASTATIN CALCIUM 20 MG PO TABS
40.0000 mg | ORAL_TABLET | Freq: Every day | ORAL | Status: DC
  Administered 2022-06-18 – 2022-07-23 (×35): 40 mg via ORAL
  Filled 2022-06-17 (×35): qty 2

## 2022-06-17 MED ORDER — GLIMEPIRIDE 4 MG PO TABS
4.0000 mg | ORAL_TABLET | Freq: Every day | ORAL | Status: DC
  Administered 2022-06-18 – 2022-07-09 (×20): 4 mg via ORAL
  Filled 2022-06-17 (×24): qty 1

## 2022-06-17 MED ORDER — GABAPENTIN 300 MG PO CAPS
300.0000 mg | ORAL_CAPSULE | Freq: Every day | ORAL | Status: DC
  Administered 2022-06-18 – 2022-07-23 (×36): 300 mg via ORAL
  Filled 2022-06-17 (×36): qty 1

## 2022-06-17 MED ORDER — EMPAGLIFLOZIN 25 MG PO TABS
25.0000 mg | ORAL_TABLET | Freq: Every day | ORAL | Status: DC
  Administered 2022-06-18 – 2022-07-09 (×22): 25 mg via ORAL
  Filled 2022-06-17 (×26): qty 1

## 2022-06-17 MED ORDER — APIXABAN 5 MG PO TABS
5.0000 mg | ORAL_TABLET | Freq: Two times a day (BID) | ORAL | Status: DC
  Administered 2022-06-18 – 2022-07-23 (×70): 5 mg via ORAL
  Filled 2022-06-17 (×70): qty 1

## 2022-06-17 MED ORDER — SODIUM CHLORIDE 0.9 % IV BOLUS
1000.0000 mL | Freq: Once | INTRAVENOUS | Status: AC
  Administered 2022-06-17: 1000 mL via INTRAVENOUS

## 2022-06-17 MED ORDER — INSULIN ASPART 100 UNIT/ML IJ SOLN
10.0000 [IU] | Freq: Once | INTRAMUSCULAR | Status: AC
  Administered 2022-06-17: 10 [IU] via INTRAVENOUS
  Filled 2022-06-17: qty 1

## 2022-06-17 MED ORDER — CANAGLIFLOZIN 100 MG PO TABS: 300.0000 mg | ORAL_TABLET | ORAL | Status: DC

## 2022-06-17 NOTE — ED Triage Notes (Signed)
Pt brought in via ems from home.  Pt states he does not know why he is here.  Hx dementia.  Pt denies any pain.  Ems reported pt has recent falls.  Pt alert   speech clear.

## 2022-06-17 NOTE — ED Notes (Signed)
Critical Result: Glucose 511  Jacelyn Grip, MD aware

## 2022-06-17 NOTE — ED Notes (Signed)
Many attempts have been made for IV access on this pt, pt is refusing to be poked again. MD notified

## 2022-06-17 NOTE — ED Notes (Signed)
IV team still not here. 2nd order for IV team placed.

## 2022-06-17 NOTE — ED Notes (Signed)
First Nurse Note: Patient to ED via [facility] from home for falls. Patient has had increased falls at home and increased urination. Hx of dementia. Aox3 disoriented to time.  364 cbg 144/81 70 HR 96%

## 2022-06-17 NOTE — ED Notes (Signed)
IV team to bedside. IV team refused to start IV due to pt eating a cracker. They left and said they would come back.

## 2022-06-17 NOTE — ED Provider Triage Note (Signed)
Emergency Medicine Provider Triage Evaluation Note  Mitchell Price , a 77 y.o. male  was evaluated in triage.  Pt complains of multiple falls. Patient has no complaints currently. Reportedly has dementia and patient is poor historian. No reported injuries. Unsure length of time with increased falls and confusion.  Review of Systems  Positive: Multiple falls, increased confusion Negative:   Physical Exam  There were no vitals taken for this visit. Gen:   Awake, no distress   Resp:  Normal effort  MSK:   Moves extremities without difficulty  Other:    Medical Decision Making  Medically screening exam initiated at 3:48 PM.  Appropriate orders placed.  ARUL FARABEE was informed that the remainder of the evaluation will be completed by another provider, this initial triage assessment does not replace that evaluation, and the importance of remaining in the ED until their evaluation is complete.  Labs, EKG, ct head/c-spine, chest xray   Darletta Moll, PA-C 06/17/22 1554
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
## 2022-06-17 NOTE — TOC Initial Note (Signed)
Transition of Care [facility]) - Initial/Assessment Note    Patient Details  Name: Mitchell Price MRN: [id number] Date of Birth: 06-03-1946  [facility]) CM/SW Contact:    Shelbie Hutching, RN Phone Number: 06/17/2022, 4:17 PM  Clinical Narrative:                  Received a call from [name] with [facility] [phone]- she reports that they have an open case for this patient.  Patient's son is with him he needs to be placed in ALF memory care, per [place] he is not taking his medications, he is not eating, and he is not safe at home.  Son is [name] [phone].  Instructed Crystal to tell the son to ask for a TOC consult and I will be able to see him tomorrow.  Patient has Medicare and Medicaid.  Patient will need PT and OT evals.        Patient Goals and CMS Choice            Expected Discharge Plan and Services                                              Prior Living Arrangements/Services                       Activities of Daily Living      Permission Sought/Granted                  Emotional Assessment              Admission diagnosis:  No admission diagnoses are documented for this encounter. Patient Active Problem List   Diagnosis Date Noted   CKD (chronic kidney disease) stage 3, GFR 30-59 ml/min ([place]) 09/12/2019   Melena 09/12/2019   Acute blood loss anemia 09/12/2019   History of hemorrhoids 09/12/2019   Type 2 diabetes mellitus with stage 3 chronic kidney disease ([place]) 09/12/2019   AKI (acute kidney injury) ([place]) 09/12/2019   PUD (peptic ulcer disease) 09/12/2019   HTN (hypertension) 09/12/2019   Upper GI bleed 09/12/2019   Hyperglycemia due to type 2 diabetes mellitus ([place]) 09/12/2019   BPH (benign prostatic hyperplasia) 01/17/2019   HLD (hyperlipidemia) 01/17/2019   Multiple thyroid nodules 07/18/2017   Grade III hemorrhoids    Acute GI bleeding 07/13/2017   GIB (gastrointestinal  bleeding) 07/12/2017   Acidosis, hyperchloremic 12/13/2015   Erectile dysfunction due to arterial insufficiency 04/13/2014   Diverticulosis 08/25/2013   Internal hemorrhoid, bleeding 08/25/2013   Diabetes type 2, uncontrolled 08/22/2013   Renal mass 08/21/2013   Lens replaced by other means 01/26/2013   Status post cataract extraction 01/26/2013   Anemia 04/01/2012   PCP:  Doretha Imus, PA-C Pharmacy:   [id number] - Closed - [place], [place] - [address] [place] [address] Phone: [phone] Fax: [phone]  [place], [place] - [place] [facility] [address] [place] [address] Phone: [phone] Fax: [phone]     Social Determinants of Health ([place]) Social History: SDOH Screenings   Tobacco Use: High Risk (06/17/2022)   SDOH Interventions:     Readmission Risk Interventions     No data to display

## 2022-06-18 LAB — CBG MONITORING, ED: Glucose-Capillary: 307 mg/dL — ABNORMAL HIGH (ref 70–99)

## 2022-06-18 NOTE — NC FL2 (Signed)
[place] LEVEL OF CARE FORM     IDENTIFICATION  Patient Name: Mitchell Price Birthdate: 06-25-1945 Sex: male Admission Date (Current Location): 06/17/2022  [place] and [place] Number:  Engineering geologist and Address:  [facility], [address], [place],  [address]      Provider Number: [id number]  Attending Physician Name and Address:  No att. providers found  Relative Name and Phone Number:  [name]- son- [phone]    Current Level of Care: Other (Comment) (ED boarder for placement) Recommended Level of Care: [place], Memory Care Prior Approval Number:    Date Approved/Denied:   PASRR Number:    Discharge Plan: Other (Comment) (ALF/ Memory Care)    Current Diagnoses: Patient Active Problem List   Diagnosis Date Noted   CKD (chronic kidney disease) stage 3, GFR 30-59 ml/min ([place]) 09/12/2019   Melena 09/12/2019   Acute blood loss anemia 09/12/2019   History of hemorrhoids 09/12/2019   Type 2 diabetes mellitus with stage 3 chronic kidney disease ([place]) 09/12/2019   AKI (acute kidney injury) ([place]) 09/12/2019   PUD (peptic ulcer disease) 09/12/2019   HTN (hypertension) 09/12/2019   Upper GI bleed 09/12/2019   Hyperglycemia due to type 2 diabetes mellitus ([place]) 09/12/2019   BPH (benign prostatic hyperplasia) 01/17/2019   HLD (hyperlipidemia) 01/17/2019   Multiple thyroid nodules 07/18/2017   Grade III hemorrhoids    Acute GI bleeding 07/13/2017   GIB (gastrointestinal bleeding) 07/12/2017   Acidosis, hyperchloremic 12/13/2015   Erectile dysfunction due to arterial insufficiency 04/13/2014   Diverticulosis 08/25/2013   Internal hemorrhoid, bleeding 08/25/2013   Diabetes type 2, uncontrolled 08/22/2013   Renal mass 08/21/2013   Lens replaced by other means 01/26/2013   Status post cataract extraction 01/26/2013   Anemia 04/01/2012    Orientation RESPIRATION BLADDER Height & Weight      Self, Place  Normal Continent Weight: 86 kg Height:  6\' 5"  (195.6 cm)  BEHAVIORAL SYMPTOMS/MOOD NEUROLOGICAL BOWEL NUTRITION STATUS  Wanderer   Continent Diet (Regular)  AMBULATORY STATUS COMMUNICATION OF NEEDS Skin   Independent Verbally Normal                       Personal Care Assistance Level of Assistance  Bathing, Feeding, Dressing Bathing Assistance: Limited assistance Feeding assistance: Independent Dressing Assistance: Independent     Functional Limitations Info  Sight, Hearing, Speech Sight Info: Adequate Hearing Info: Adequate Speech Info: Adequate    SPECIAL CARE FACTORS FREQUENCY                       Contractures Contractures Info: Not present    Additional Factors Info  Code Status, Allergies Code Status Info: Full Allergies Info: NKA           Current Medications (06/18/2022):  This is the current hospital active medication list Current Facility-Administered Medications  Medication Dose Route Frequency Provider Last Rate Last Admin   apixaban (ELIQUIS) tablet 5 mg  5 mg Oral BID Vladimir Crofts, MD   5 mg at 06/18/22 0921   atorvastatin (LIPITOR) tablet 40 mg  40 mg Oral Daily Vladimir Crofts, MD   40 mg at 06/18/22 2423   empagliflozin (JARDIANCE) tablet 25 mg  25 mg Oral Daily Vladimir Crofts, MD   25 mg at 06/18/22 0949   gabapentin (NEURONTIN) capsule 300 mg  300 mg Oral Daily Vladimir Crofts, MD   300 mg at  06/18/22 0921   glimepiride (AMARYL) tablet 4 mg  4 mg Oral Q breakfast Vladimir Crofts, MD   4 mg at 06/18/22 1751   Current Outpatient Medications  Medication Sig Dispense Refill   acetaminophen (TYLENOL) 500 MG tablet Take 500 mg by mouth every 6 (six) hours as needed for mild pain.     apixaban (ELIQUIS) 5 MG TABS tablet TAKE 1 TABLET(5 MG) BY MOUTH TWICE DAILY 60 tablet 5   atorvastatin (LIPITOR) 40 MG tablet Take 40 mg by mouth daily.     docusate sodium (COLACE) 100 MG capsule Take 100 mg by mouth 2 (two) times daily as needed for mild  constipation.     empagliflozin (JARDIANCE) 25 MG TABS tablet Take 25 mg by mouth daily.     ferrous sulfate 325 (65 FE) MG EC tablet Take 325 mg by mouth daily with breakfast.     glimepiride (AMARYL) 4 MG tablet Take 4 mg by mouth every morning.   3   metFORMIN (GLUCOPHAGE) 1000 MG tablet Take 1,000 mg by mouth 2 (two) times daily.  3   pioglitazone (ACTOS) 45 MG tablet Take 45 mg by mouth daily.     doxycycline (VIBRAMYCIN) 100 MG capsule Take 1 capsule (100 mg total) by mouth 2 (two) times daily. (Patient not taking: Reported on 06/18/2022) 20 capsule 0   gabapentin (NEURONTIN) 300 MG capsule Take 300 mg by mouth daily. (Patient not taking: Reported on 06/18/2022)     guaiFENesin (MUCUS RELIEF PO) Take by mouth. (Patient not taking: Reported on 06/18/2022)     INVOKANA 300 MG TABS tablet Take 300 mg by mouth every morning. (Patient not taking: Reported on 06/18/2022)  3   lisinopril (PRINIVIL,ZESTRIL) 10 MG tablet Take 10 mg by mouth daily. (Patient not taking: Reported on 06/18/2022)  3   tamsulosin (FLOMAX) 0.4 MG CAPS capsule Take 0.4 mg by mouth daily. (Patient not taking: Reported on 06/18/2022)  1     Discharge Medications: Please see discharge summary for a list of discharge medications.  Relevant Imaging Results:  Relevant Lab Results:   Additional Information    Shelbie Hutching, RN

## 2022-06-18 NOTE — Inpatient Diabetes Management (Signed)
Inpatient Diabetes Program Recommendations  AACE/ADA: New Consensus Statement on Inpatient Glycemic Control (2015)  Target Ranges:  Prepandial:   less than 140 mg/dL      Peak postprandial:   less than 180 mg/dL (1-2 hours)      Critically ill patients:  140 - 180 mg/dL    Latest Reference Range & Units 06/17/22 23:10 06/18/22 03:00  Glucose-Capillary 70 - 99 mg/dL 391 (H) 307 (H)  (H): Data is abnormally high   Home DM Meds: Metformin 1000 mg BID     Actos 30 mg Daily     Jardiance 25 mg Daily    Amaryl 4 mg Daily  Current Orders: Jardiance 25 mg Daily    Amaryl 4 mg Daily     Lab Glu was 511 yest at 4pm on arrival to the ED (AG 13/CO2 20).  Was given 1L NS IVF bolus and 10 units Novolog X 1 dose at 11pm last night.    CBG down to 307 by 3am  Starting Amaryl and Jardiance this AM      MD- while pt awaiting psychiatric evaluation, please consider:  1. Start CBG checks TID AC  2. If CBGs elevated, please start Novolog Sensitive Correction Scale/ SSI (0-9 units) TID AC     --Will follow patient during hospitalization--  Wyn Quaker RN, MSN, [place] Diabetes Coordinator Inpatient Glycemic Control Team Team Pager: [phone] (8a-5p)

## 2022-06-18 NOTE — TOC Progression Note (Signed)
Transition of Care [facility]) - Progression Note    Patient Details  Name: Mitchell Price MRN: [id number] Date of Birth: 1946/03/12  Transition of Care [facility]) CM/SW Contact  Shelbie Hutching, RN Phone Number: 06/18/2022, 4:40 PM  Clinical Narrative:    [name]  [phone] with [place] [facility] came to see patient today, she requests an FL2 to assist with finding ALF for patient.  He is unsafe to return home alone.  The son has a guardianship hearing for his father later this month.  RNCM spoke with [name] and informed him that we will start looking for ALF/memory Care.   FL2 secure emailed to [employer].     Expected Discharge Plan: Assisted Living Barriers to Discharge: ED Unsafe disposition  Expected Discharge Plan and Services   Discharge Planning Services: CM Consult   Living arrangements for the past 2 months: Single Family Home                 DME Arranged: N/A DME Agency: NA                   Social Determinants of Health (SDOH) Interventions SDOH Screenings   Tobacco Use: High Risk (06/17/2022)    Readmission Risk Interventions     No data to display

## 2022-06-18 NOTE — ED Notes (Signed)
Hospital tray and a fresh cup of ginger ale with ice in a foam cup was provided. Pt encouraged to sit up to eat. Pt said "Thank you, ma'am" and denied needing anything else at this time.

## 2022-06-19 DIAGNOSIS — R413 Other amnesia: Secondary | ICD-10-CM | POA: Diagnosis present

## 2022-06-19 DIAGNOSIS — F0390 Unspecified dementia without behavioral disturbance: Secondary | ICD-10-CM | POA: Diagnosis present

## 2022-06-19 LAB — CBG MONITORING, ED
Glucose-Capillary: 184 mg/dL — ABNORMAL HIGH (ref 70–99)
Glucose-Capillary: 200 mg/dL — ABNORMAL HIGH (ref 70–99)
Glucose-Capillary: 210 mg/dL — ABNORMAL HIGH (ref 70–99)
Glucose-Capillary: 446 mg/dL — ABNORMAL HIGH (ref 70–99)
Glucose-Capillary: 468 mg/dL — ABNORMAL HIGH (ref 70–99)
Glucose-Capillary: 475 mg/dL — ABNORMAL HIGH (ref 70–99)
Glucose-Capillary: 66 mg/dL — ABNORMAL LOW (ref 70–99)

## 2022-06-19 MED ORDER — INSULIN ASPART 100 UNIT/ML IJ SOLN
10.0000 [IU] | Freq: Once | INTRAMUSCULAR | Status: AC
  Administered 2022-06-19: 10 [IU] via SUBCUTANEOUS
  Filled 2022-06-19: qty 1

## 2022-06-19 MED ORDER — INSULIN ASPART 100 UNIT/ML IJ SOLN
0.0000 [IU] | Freq: Every day | INTRAMUSCULAR | Status: DC
  Administered 2022-06-19: 0 [IU] via SUBCUTANEOUS
  Administered 2022-06-19: 2 [IU] via SUBCUTANEOUS
  Administered 2022-06-20: 3 [IU] via SUBCUTANEOUS
  Administered 2022-06-21 – 2022-06-29 (×3): 2 [IU] via SUBCUTANEOUS
  Administered 2022-06-30: 3 [IU] via SUBCUTANEOUS
  Administered 2022-07-02 – 2022-07-04 (×2): 2 [IU] via SUBCUTANEOUS
  Administered 2022-07-07: 3 [IU] via SUBCUTANEOUS
  Administered 2022-07-08: 2 [IU] via SUBCUTANEOUS
  Filled 2022-06-19 (×9): qty 1

## 2022-06-19 MED ORDER — INSULIN ASPART 100 UNIT/ML IJ SOLN
0.0000 [IU] | Freq: Three times a day (TID) | INTRAMUSCULAR | Status: DC
  Administered 2022-06-19: 3 [IU] via SUBCUTANEOUS
  Administered 2022-06-19 (×2): 15 [IU] via SUBCUTANEOUS
  Administered 2022-06-20 (×2): 8 [IU] via SUBCUTANEOUS
  Administered 2022-06-20: 15 [IU] via SUBCUTANEOUS
  Administered 2022-06-21: 3 [IU] via SUBCUTANEOUS
  Administered 2022-06-21: 15 [IU] via SUBCUTANEOUS
  Administered 2022-06-22: 3 [IU] via SUBCUTANEOUS
  Administered 2022-06-22: 2 [IU] via SUBCUTANEOUS
  Administered 2022-06-23: 5 [IU] via SUBCUTANEOUS
  Administered 2022-06-23 (×2): 3 [IU] via SUBCUTANEOUS
  Administered 2022-06-24: 8 [IU] via SUBCUTANEOUS
  Administered 2022-06-24: 3 [IU] via SUBCUTANEOUS
  Administered 2022-06-24: 5 [IU] via SUBCUTANEOUS
  Administered 2022-06-25: 3 [IU] via SUBCUTANEOUS
  Administered 2022-06-25: 5 [IU] via SUBCUTANEOUS
  Administered 2022-06-25 – 2022-06-26 (×2): 3 [IU] via SUBCUTANEOUS
  Administered 2022-06-26: 8 [IU] via SUBCUTANEOUS
  Administered 2022-06-27: 5 [IU] via SUBCUTANEOUS
  Administered 2022-06-27: 8 [IU] via SUBCUTANEOUS
  Administered 2022-06-28: 5 [IU] via SUBCUTANEOUS
  Administered 2022-06-28 (×2): 8 [IU] via SUBCUTANEOUS
  Administered 2022-06-29 (×2): 3 [IU] via SUBCUTANEOUS
  Administered 2022-06-30: 15 [IU] via SUBCUTANEOUS
  Administered 2022-06-30 – 2022-07-01 (×2): 5 [IU] via SUBCUTANEOUS
  Administered 2022-07-01: 8 [IU] via SUBCUTANEOUS
  Administered 2022-07-01: 3 [IU] via SUBCUTANEOUS
  Administered 2022-07-02: 15 [IU] via SUBCUTANEOUS
  Administered 2022-07-03 (×2): 5 [IU] via SUBCUTANEOUS
  Administered 2022-07-03 – 2022-07-04 (×2): 3 [IU] via SUBCUTANEOUS
  Administered 2022-07-04: 8 [IU] via SUBCUTANEOUS
  Administered 2022-07-05: 2 [IU] via SUBCUTANEOUS
  Administered 2022-07-05: 5 [IU] via SUBCUTANEOUS
  Administered 2022-07-05: 8 [IU] via SUBCUTANEOUS
  Administered 2022-07-06: 11 [IU] via SUBCUTANEOUS
  Administered 2022-07-06: 8 [IU] via SUBCUTANEOUS
  Administered 2022-07-06 – 2022-07-07 (×2): 3 [IU] via SUBCUTANEOUS
  Administered 2022-07-07: 5 [IU] via SUBCUTANEOUS
  Administered 2022-07-07: 3 [IU] via SUBCUTANEOUS
  Administered 2022-07-08 (×2): 5 [IU] via SUBCUTANEOUS
  Administered 2022-07-09: 3 [IU] via SUBCUTANEOUS
  Administered 2022-07-10: 5 [IU] via SUBCUTANEOUS
  Administered 2022-07-10: 8 [IU] via SUBCUTANEOUS
  Filled 2022-06-19 (×53): qty 1

## 2022-06-19 MED ORDER — TUBERCULIN PPD 5 UNIT/0.1ML ID SOLN
5.0000 [IU] | INTRADERMAL | Status: AC
  Administered 2022-06-19: 5 [IU] via INTRADERMAL
  Filled 2022-06-19: qty 0.1

## 2022-06-19 NOTE — ED Notes (Signed)
Pt walked to the restroom stand by assist. Pt is now currently eating breakfast. Sitting up on the side of the bed. No other needs found at this moment.

## 2022-06-19 NOTE — ED Notes (Signed)
Son sitting at pt bedside

## 2022-06-19 NOTE — ED Notes (Signed)
Pt given lunch tray and drink. Pt calm and cooperative at this time 

## 2022-06-19 NOTE — ED Provider Notes (Addendum)
-----------------------------------------   5:57 AM on 06/19/2022 -----------------------------------------   Blood pressure 107/74, pulse 94, temperature 97.8 F (36.6 C), temperature source Oral, resp. rate 18, height 6\' 5"  (1.956 m), weight 86 kg, SpO2 96 %.  The patient is calm and cooperative at this time.  There have been no acute events since the last update.  Sliding scale insulin orders placed.  Awaiting disposition plan from Social Work team(s).    ----------------------------------------- 6:24 AM on 06/19/2022 -----------------------------------------   There are some confusion surrounding patient's IVC paperwork.  IVC order was removed from the computer but there is no retention paperwork.  Looks like psych consult was placed almost 24 hours ago but there is no psychiatry noted in the computer.  I will reconsult psychiatry to evaluate and likely recent patient's IVC so social work may proceed with placement.    Paulette Blanch, MD 06/19/22 [phone]

## 2022-06-19 NOTE — TOC Progression Note (Signed)
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  He would like to come and evaluate to patient, sounds like a good fit for one of his homes.  He will be sending someone to see the patient tomorrow.  Larene Beach with be the SW working tomorrow in the ED- provided [name] with her number- [phone].   Asked MD to order PPD skin test, patient will need this to be admitted to facility.    Expected Discharge Plan: Assisted Living Barriers to Discharge: ED Unsafe disposition  Expected Discharge Plan and Services   Discharge Planning Services: CM Consult   Living arrangements for the past 2 months: Single Family Home                 DME Arranged: N/A DME Agency: NA                   Social Determinants of Health (SDOH) Interventions SDOH Screenings   Tobacco Use: High Risk (06/17/2022)    Readmission Risk Interventions     No data to display

## 2022-06-19 NOTE — ED Notes (Signed)
Psych consult pending also TOC placement and awaiting disposition plan from Social Work

## 2022-06-19 NOTE — Evaluation (Signed)
Physical Therapy Evaluation Patient Details Name: KACY CONELY MRN: [id number] DOB: 1946-02-19 Today's Date: 06/19/2022  History of Present Illness  Mitchell Price is a 79yoM who comes to [facility] on 1/10 from home IVC by family due to concerns of progression of AMS and falls. DSS has an open case for pt. Pt previously living with his significant other who transitioned to LTC 2 months prior after hospitalization, pt has decline since then, Son reports multiple insidious falls, reports of pt meandering adjacent properties while carrying his handgun, a few episode of pt getting lost while driving 1x found in [place], another time found in [place]. Son reports home has been without electricity for ~1 week and he has found pt inside without clothes on in very cold conditions.  Clinical Impression  Pt in ED hallway bed sitting up snacking, quite sociable and engaging, son at bedside. Pt agreeable to evaluation, performs all mobility with modified independence to supervision. Pt reports to feel good, save for when AMB when his socked feet are poorly tolerant to the hard floor due to neuropathy. Pt's balance screening is not revealing of any potential etiology for recent increase in falls, however sounds as though pt has hard swings in mentation as of late which include a dramatic loss in his sense of what is safe and appropriate to situation. Chief Strategy Officer agrees with son, home environment is not safe, most notably active gun in home, meds noncompliance, lack of electricity. It seems pt has very poor awareness of these recent cognitive changes which makes his desire to be actively involved in the decision making process quite complicated. Will continue to follow while admitted, hoping additional screening will be enlightening regarding falls risk factors.       Recommendations for follow up therapy are one component of a multi-disciplinary discharge planning process, led by the attending physician.  Recommendations may  be updated based on patient status, additional functional criteria and insurance authorization.  Follow Up Recommendations Long-term institutional care without follow-up therapy Can patient physically be transported by private vehicle: Yes    Assistance Recommended at Discharge Frequent or constant Supervision/Assistance  Patient can return home with the following  A little help with walking and/or transfers;A little help with bathing/dressing/bathroom;Assistance with cooking/housework;Assist for transportation;Direct supervision/assist for medications management;Direct supervision/assist for financial management    Equipment Recommendations None recommended by PT  Recommendations for Other Services       Functional Status Assessment Patient has had a recent decline in their functional status and demonstrates the ability to make significant improvements in function in a reasonable and predictable amount of time.     Precautions / Restrictions Precautions Precautions: Fall Restrictions Weight Bearing Restrictions: No      Mobility  Bed Mobility Overal bed mobility: Independent                  Transfers Overall transfer level: Independent                 General transfer comment: shockingly well from low bed given tall stature.    Ambulation/Gait   Gait Distance (Feet): 250 Feet Assistive device: None Gait Pattern/deviations: WFL(Within Functional Limits)       General Gait Details: returns to bed due to neuropathy pain  Stairs            Wheelchair Mobility    Modified Rankin (Stroke Patients Only)       Balance Overall balance assessment: History of Falls, Modified Independent  Standardized Balance Assessment Standardized Balance Assessment : Berg Balance Test Berg Balance Test Sit to Stand: Able to stand without using hands and stabilize independently Standing Unsupported: Able to stand  safely 2 minutes Sitting with Back Unsupported but Feet Supported on Floor or Stool: Able to sit safely and securely 2 minutes Stand to Sit: Sits safely with minimal use of hands Transfers: Able to transfer safely, minor use of hands Standing Unsupported with Eyes Closed: Able to stand 10 seconds safely Standing Ubsupported with Feet Together: Able to place feet together independently and stand for 1 minute with supervision From Standing, Reach Forward with Outstretched Arm: Can reach confidently >25 cm (10") Turn 360 Degrees: Able to turn 360 degrees safely but slowly         Pertinent Vitals/Pain Pain Assessment Pain Assessment: No/denies pain (has paresthesia in feet bilat whie AMB barefoot in ED, suspect 2/2 poorly controlled DM)    Home Living Family/patient expects to be discharged to:: Private residence Living Arrangements: Alone (SO transitioned to LTC in Later 2023.) Available Help at Discharge: Family (son assists with meals/food, lives ~ 25 minutes away in [place].) Type of Home: Mobile home Home Access: Stairs to enter   Secretary/administrator of Steps: 4-5   Home Layout: One level Home Equipment: None      Prior Function Prior Level of Function : Independent/Modified Independent             Mobility Comments: quite mobile, but mostly homebound, recently AMB about ne[place]borhood with his pistol which inclueds entry stairs performance ADLs Comments: independent, questionable fluctuations in cogntiive status dictate compliance of performance; son reports medication compliance is inadequate     Hand Dominance        Extremity/Trunk Assessment                Communication      Cognition Arousal/Alertness: Awake/alert Behavior During Therapy: WFL for tasks assessed/performed Overall Cognitive Status: History of cognitive impairments - at baseline                                          General Comments      Exercises      Assessment/Plan    PT Assessment Patient needs continued PT services  PT Problem List Impaired sensation;Decreased knowledge of precautions;Decreased safety awareness;Decreased knowledge of use of DME       PT Treatment Interventions Functional mobility training;Therapeutic activities;Balance training;Patient/family education;Cognitive remediation;Neuromuscular re-education    PT Goals (Current goals can be found in the Care Plan section)  Acute Rehab PT Goals Patient Stated Goal: stop any more falls from happening PT Goal Formulation: With family Time For Goal Achievement: 07/03/22 Potential to Achieve Goals: Fair    Frequency Min 2X/week     Co-evaluation               AM-PAC PT "6 Clicks" Mobility  Outcome Measure Help needed turning from your back to your side while in a flat bed without using bedrails?: None Help needed moving from lying on your back to sitting on the side of a flat bed without using bedrails?: None Help needed moving to and from a bed to a chair (including a wheelchair)?: None Help needed standing up from a chair using your arms (e.g., wheelchair or bedside chair)?: None Help needed to walk in hospital room?: A Little Help needed climbing 3-5 steps with  a railing? : A Little 6 Click Score: 22    End of Session   Activity Tolerance: Patient tolerated treatment well;Patient limited by pain Patient left: in bed;with family/visitor present Nurse Communication: Mobility status (pt hungry, wants something other than crackers, his lunch was not enough) PT Visit Diagnosis: Other abnormalities of gait and mobility (R26.89);Other symptoms and signs involving the nervous system (R29.898)    Time: 9326-7124 PT Time Calculation (min) (ACUTE ONLY): 14 min   Charges:   PT Evaluation $PT Eval Low Complexity: 1 Low         2:42 PM, 06/19/22 [name], PT, DPT Physical Therapist - [facility]  [phone]  ([place])    Williamson C 06/19/2022, 2:36 PM

## 2022-06-19 NOTE — ED Notes (Signed)
Vol rescinded by Gust Rung, TOC placement

## 2022-06-19 NOTE — ED Notes (Signed)
Breakfast placed at side.

## 2022-06-19 NOTE — Inpatient Diabetes Management (Signed)
Inpatient Diabetes Program Recommendations  AACE/ADA: New Consensus Statement on Inpatient Glycemic Control (2015)  Target Ranges:  Prepandial:   less than 140 mg/dL      Peak postprandial:   less than 180 mg/dL (1-2 hours)      Critically ill patients:  140 - 180 mg/dL    Latest Reference Range & Units 06/19/22 00:21 06/19/22 03:27 06/19/22 09:24 06/19/22 15:18 06/19/22 16:51  Glucose-Capillary 70 - 99 mg/dL 446 (H)  10 units Novolog  200 (H) 184 (H)  3 units Novolog @1055  468 (H)  15 units Novolog  475 (H)  15 units Novolog   (H): Data is abnormally high    Home DM Meds: Metformin 1000 mg BID     Actos 30 mg Daily     Jardiance 25 mg Daily    Amaryl 4 mg Daily   Current Orders: Jardiance 25 mg Daily    Amaryl 4 mg Daily    Novolog Moderate Correction Scale/ SSI (0-15 units) TID AC + HS       Note CBGs >400 this afternoon  9:24am CBG not covered with Novolog SSI until 10:55am  No CBG for 12pm  Pt given 2 doses Novolog back to back at 3pm and then again at 5pm (looks like 12pm coverage given at 3pm??)   MD- Please consider the following:  1. Add back home dose Metformin 1000 mg BID  2. Add back home dose Actos 30 mg daily  3. Start Novolog 4 units TID for meal coverage (give with SSi dose) HOLD if pt NPO HOLD if pt eats <50% meal    --Will follow patient during hospitalization--  Wyn Quaker RN, MSN, [place] Diabetes Coordinator Inpatient Glycemic Control Team Team Pager: [phone] (8a-5p)
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
## 2022-06-20 LAB — CBG MONITORING, ED
Glucose-Capillary: 266 mg/dL — ABNORMAL HIGH (ref 70–99)
Glucose-Capillary: 374 mg/dL — ABNORMAL HIGH (ref 70–99)
Glucose-Capillary: 271 mg/dL — ABNORMAL HIGH (ref 70–99)
Glucose-Capillary: 275 mg/dL — ABNORMAL HIGH (ref 70–99)

## 2022-06-20 MED ORDER — INSULIN ASPART 100 UNIT/ML IJ SOLN
4.0000 [IU] | Freq: Three times a day (TID) | INTRAMUSCULAR | Status: DC
  Administered 2022-06-20 – 2022-06-23 (×10): 4 [IU] via SUBCUTANEOUS
  Filled 2022-06-20 (×10): qty 1

## 2022-06-20 MED ORDER — METFORMIN HCL 500 MG PO TABS
1000.0000 mg | ORAL_TABLET | Freq: Two times a day (BID) | ORAL | Status: DC
  Administered 2022-06-20 – 2022-07-08 (×35): 1000 mg via ORAL
  Filled 2022-06-20 (×35): qty 2

## 2022-06-20 MED ORDER — PIOGLITAZONE HCL 30 MG PO TABS
30.0000 mg | ORAL_TABLET | Freq: Every day | ORAL | Status: DC
  Administered 2022-06-20 – 2022-07-09 (×20): 30 mg via ORAL
  Filled 2022-06-20 (×22): qty 1

## 2022-06-20 NOTE — ED Provider Notes (Signed)
-----------------------------------------   6:27 AM on 06/20/2022 -----------------------------------------   Blood pressure 120/70, pulse 91, temperature 98.4 F (36.9 C), temperature source Oral, resp. rate 18, height 6\' 5"  (1.956 m), weight 86 kg, SpO2 96 %.  The patient is calm and cooperative at this time.  There have been no acute events since the last update.  Awaiting disposition plan from case management/social work.    Johniece Hornbaker, Delice Bison, DO 06/20/22 [phone]

## 2022-06-20 NOTE — ED Provider Notes (Signed)
Emergency Medicine Observation Re-evaluation Note  Mitchell Price is a 77 y.o. male, only awaiting placement to an appropriate facility by social work.  No acute events since last update.  Physical Exam  BP 109/74 (BP Location: Left Arm)   Pulse 87   Temp 98.2 F (36.8 C) (Oral)   Resp 18   Ht 6\' 5"  (1.956 m)   Wt 86 kg   SpO2 99%   BMI 22.48 kg/m   ED Course / MDM   Patient's blood sugars have been running higher.  Patient was seen by the diabetic coordinator who gave recommendations for orders of metformin and Actos and increasing NovoLog insulin I have placed these orders we will continue to closely monitor.  Plan  Current plan is for placement to an assisted living facility/group facility once available.    Harvest Dark, MD 06/20/22 2235

## 2022-06-20 NOTE — Inpatient Diabetes Management (Signed)
Inpatient Diabetes Program Recommendations  AACE/ADA: New Consensus Statement on Inpatient Glycemic Control (2015)  Target Ranges:  Prepandial:   less than 140 mg/dL      Peak postprandial:   less than 180 mg/dL (1-2 hours)      Critically ill patients:  140 - 180 mg/dL    Latest Reference Range & Units 06/19/22 09:24 06/19/22 15:18 06/19/22 16:51 06/19/22 21:02 06/19/22 23:30  Glucose-Capillary 70 - 99 mg/dL 184 (H)  3 units Novolog @1055  468 (H)  15 units Novolog @1548  475 (H)  15 units Novolog @1700  66 (L) 210 (H)  2 units Novolog   (H): Data is abnormally high (L): Data is abnormally low  Latest Reference Range & Units 06/20/22 08:22 06/20/22 11:59  Glucose-Capillary 70 - 99 mg/dL 266 (H)  8 units Novolog  374 (H)  15 units Novolog   (H): Data is abnormally high   Home DM Meds: Metformin 1000 mg BID     Actos 30 mg Daily     Jardiance 25 mg Daily    Amaryl 4 mg Daily   Current Orders: Jardiance 25 mg Daily    Amaryl 4 mg Daily    Novolog Moderate Correction Scale/ SSI (0-15 units) TID AC + HS         Pt given 2 doses Novolog back to back at 4pm and then again at 5pm (looks like 12pm coverage given at 4pm??)  Hypoglycemia by 9pm     MD- Please consider the following:   1. Add back home dose Metformin 1000 mg BID   2. Add back home dose Actos 30 mg daily   3. Start Novolog 4 units TID for meal coverage (give with SSi dose) HOLD if pt NPO HOLD if pt eats <50% meal      --Will follow patient during hospitalization--  Wyn Quaker RN, MSN, [place] Diabetes Coordinator Inpatient Glycemic Control Team Team Pager: [phone] (8a-5p)

## 2022-06-20 NOTE — ED Notes (Signed)
Patient is vol pending TOC placement 

## 2022-06-20 NOTE — Progress Notes (Signed)
The potential group home placement did not call CSW and CSW is not aware they came to complete interview with patient.

## 2022-06-20 NOTE — ED Notes (Signed)
VOL/TOC Placement 

## 2022-06-21 LAB — CBG MONITORING, ED
Glucose-Capillary: 496 mg/dL — ABNORMAL HIGH (ref 70–99)
Glucose-Capillary: 177 mg/dL — ABNORMAL HIGH (ref 70–99)
Glucose-Capillary: 103 mg/dL — ABNORMAL HIGH (ref 70–99)
Glucose-Capillary: 212 mg/dL — ABNORMAL HIGH (ref 70–99)

## 2022-06-21 MED ORDER — MELATONIN 5 MG PO TABS
2.5000 mg | ORAL_TABLET | Freq: Every day | ORAL | Status: DC
  Administered 2022-06-22 – 2022-07-10 (×19): 2.5 mg via ORAL
  Filled 2022-06-21 (×21): qty 1

## 2022-06-21 NOTE — ED Notes (Signed)
Gave patient sandwich tray and water.

## 2022-06-21 NOTE — ED Notes (Signed)
VOL TOC placement 

## 2022-06-21 NOTE — ED Notes (Signed)
Patient did not want to get cleaned up. Patient refused more than 3x. RN was notified and tried to assist EDT tech. RN did change his bed lien. Patient is confused to where he is at. Patient meal was given.

## 2022-06-21 NOTE — ED Notes (Signed)
Pt given food tray.

## 2022-06-21 NOTE — Inpatient Diabetes Management (Signed)
Inpatient Diabetes Program Recommendations  AACE/ADA: New Consensus Statement on Inpatient Glycemic Control (2015)  Target Ranges:  Prepandial:   less than 140 mg/dL      Peak postprandial:   less than 180 mg/dL (1-2 hours)      Critically ill patients:  140 - 180 mg/dL    Latest Reference Range & Units 06/21/22 07:51 06/21/22 11:40  Glucose-Capillary 70 - 99 mg/dL 496 (H) 177 (H)  (H): Data is abnormally high    Home DM Meds: Metformin 1000 mg BID     Actos 30 mg Daily     Jardiance 25 mg Daily    Amaryl 4 mg Daily   Current Orders: Jardiance 25 mg Daily    Amaryl 4 mg Daily    Novolog Moderate Correction Scale/ SSI (0-15 units) TID AC + HS   Novolog 4 units TID with meals   Actos 30 mg daily    Metformin 1000 mg BID    MD- Note CBG was 496 this AM.  May also consider starting weight based basal insulin while pt boarding in the ED:  Semglee 8 units QHS (0.1 units/kg)    --Will follow patient during hospitalization--  Wyn Quaker RN, MSN, [place] Diabetes Coordinator Inpatient Glycemic Control Team Team Pager: [phone] (8a-5p)

## 2022-06-21 NOTE — ED Notes (Signed)
Pt bed linens changed. RN and tech offered to bathe and change pt, but pt refused stating he "will do it after while."

## 2022-06-21 NOTE — ED Provider Notes (Signed)
Emergency Medicine Observation Re-evaluation Note  Mitchell Price is a 77 y.o. male, seen on rounds today.  Pt initially presented to the ED for complaints of Fall  Currently, the patient is no acute distress. Pt TB test was negative---  Physical Exam  Blood pressure 119/82, pulse 82, temperature 98.4 F (36.9 C), temperature source Oral, resp. rate 17, height 6\' 5"  (1.956 m), weight 86 kg, SpO2 100 %.    ED Course / MDM     I have reviewed the labs performed to date as well as medications administered while in observation.  Recent changes in the last 24 hours include  had insulin orders recently adjusted. Sugar today 177/103  Plan   Current plan is to continue to wait for SW Patient is not under full IVC at this time.   [place], MD 06/21/22 [phone]

## 2022-06-21 NOTE — ED Notes (Signed)
Tuberculin PPD test read by this RN. Test result negative.

## 2022-06-21 NOTE — ED Notes (Signed)
Hospital meal provided, pt tolerated w/o complaints.  Waste discarded appropriately.  

## 2022-06-22 LAB — CBG MONITORING, ED
Glucose-Capillary: 149 mg/dL — ABNORMAL HIGH (ref 70–99)
Glucose-Capillary: 199 mg/dL — ABNORMAL HIGH (ref 70–99)
Glucose-Capillary: 110 mg/dL — ABNORMAL HIGH (ref 70–99)
Glucose-Capillary: 153 mg/dL — ABNORMAL HIGH (ref 70–99)

## 2022-06-22 NOTE — Progress Notes (Signed)
PT Cancellation Note  Patient Details Name: Mitchell Price MRN: [id number] DOB: 01/23/1946   Cancelled Treatment:    Reason Eval/Treat Not Completed: PT screened, no needs identified, will sign off (Pt remains in ED hallway. Initially showing no actue deficits in mobility. RN reports pt has been AMB independently to BR, o concerns regarding balance/safety. Will signoff at this time, defer anyadditional PT needs to PRN post DC.)  11:48 AM, 06/22/22 [name], PT, DPT Physical Therapist - [facility]  [phone] ([place])    Gaye Scorza C 06/22/2022, 11:48 AM

## 2022-06-22 NOTE — TOC Progression Note (Signed)
Transition of Care [facility]) - Progression Note    Patient Details  Name: Mitchell Price MRN: [id number] Date of Birth: 09/15/1945  [facility] CM/SW Contact  Shelbie Hutching, RN Phone Number: 06/22/2022, 9:17 AM  Clinical Narrative:    Reached out to [employer], he was able to get his coworker to come and evaluate patient on Saturday.  He says that the patient seems to be a good fit for their home.  TB test negative.  [employer] will call the son to speak with him.  He asks that the son complete a special assistance Medicaid application and they will need at least 30 day supply of medications sent to their pharmacy [facility] in [place].     Expected Discharge Plan: Assisted Living Barriers to Discharge: ED Unsafe disposition  Expected Discharge Plan and Services   Discharge Planning Services: CM Consult   Living arrangements for the past 2 months: Single Family Home                 DME Arranged: N/A DME Agency: NA                   Social Determinants of Health (SDOH) Interventions SDOH Screenings   Tobacco Use: High Risk (06/17/2022)    Readmission Risk Interventions     No data to display

## 2022-06-22 NOTE — ED Notes (Signed)
Pt given given dinner tray and drink.

## 2022-06-22 NOTE — ED Notes (Signed)
vol/pending toc placement.. 

## 2022-06-22 NOTE — ED Notes (Signed)
Pt sleeping, will collect vital signs once he is awake

## 2022-06-22 NOTE — ED Provider Notes (Signed)
-----------------------------------------   5:21 AM on 06/22/2022 -----------------------------------------   Blood pressure (!) 111/59, pulse 80, temperature (!) 97.5 F (36.4 C), temperature source Oral, resp. rate 16, height 6\' 5"  (1.956 m), weight 86 kg, SpO2 100 %.  The patient is calm and cooperative at this time.  There have been no acute events since the last update.  Awaiting disposition plan from Social Work team.   Paulette Blanch, MD 06/22/22 [phone]

## 2022-06-22 NOTE — ED Notes (Signed)
Glucose checked 110

## 2022-06-22 NOTE — TOC Progression Note (Signed)
Transition of Care [facility]) - Progression Note    Patient Details  Name: Mitchell Price MRN: [id number] Date of Birth: 1945/07/01  Transition of Care [facility]) CM/SW Contact  Shelbie Hutching, RN Phone Number: 06/22/2022, 3:38 PM  Clinical Narrative:    Spoke with patient's son, [name].  Richard is on board with patient going to ALF in [place], he was worried after talking to family that it may be too far but at this time there are no other options.  Richard will go tomorrow to DSS to fill out the LTC Medicaid app.     Expected Discharge Plan: Assisted Living Barriers to Discharge: ED Unsafe disposition  Expected Discharge Plan and Services   Discharge Planning Services: CM Consult   Living arrangements for the past 2 months: Single Family Home                 DME Arranged: N/A DME Agency: NA                   Social Determinants of Health (SDOH) Interventions SDOH Screenings   Tobacco Use: High Risk (06/17/2022)    Readmission Risk Interventions     No data to display

## 2022-06-22 NOTE — ED Notes (Signed)
VOL/pending placement 

## 2022-06-22 NOTE — ED Notes (Signed)
Pt has finished his dinner tray that he had when this nurse arrived. 100% eaten

## 2022-06-23 ENCOUNTER — Emergency Department: Payer: Medicare HMO

## 2022-06-23 LAB — CBG MONITORING, ED
Glucose-Capillary: 171 mg/dL — ABNORMAL HIGH (ref 70–99)
Glucose-Capillary: 183 mg/dL — ABNORMAL HIGH (ref 70–99)
Glucose-Capillary: 215 mg/dL — ABNORMAL HIGH (ref 70–99)
Glucose-Capillary: 182 mg/dL — ABNORMAL HIGH (ref 70–99)
Glucose-Capillary: 231 mg/dL — ABNORMAL HIGH (ref 70–99)

## 2022-06-23 NOTE — ED Notes (Signed)
Pt ambulated to restroom on own. This nurse was in room beside restroom and heard pt fall to floor. This nurse entered restroom and pt had urinated and defecated on floor and slipped on that. Pt able to get up on own from floor. Pt states he was attempting to wipe self and fell backward. Clean/ dry clothing placed on pt and cleaned. Ambulates with no assistance back to bed with this nurse. Pt reports hitting back of head, denies pain, no LOC

## 2022-06-23 NOTE — ED Notes (Signed)
Pt given dinner tray at this time.  

## 2022-06-23 NOTE — ED Notes (Signed)
VOL  TOC  PLACEMENT 

## 2022-06-23 NOTE — ED Provider Notes (Addendum)
-----------------------------------------   6:24 AM on 06/23/2022 -----------------------------------------   Blood pressure 107/66, pulse 77, temperature 98.1 F (36.7 C), resp. rate 17, height 6\' 5"  (1.956 m), weight 86 kg, SpO2 100 %.  The patient is calm and cooperative at this time.  There have been no acute events since the last update.  Awaiting disposition plan from case management/social work.    Bridey Brookover, Delice Bison, DO 06/23/22 1287    6:30 AM  Just informed by nursing staff that patient fell in the bathroom.  Fall was unwitnessed.  Nurse says it appears that patient urinated on the floor and likely slipped in it.  He states he did hit his head.  Will obtain CT head and cervical spine.  Denies any other injury.  Denies any pain.  No loss of consciousness.  Not on blood thinners.  Is ambulatory.  Will recheck vitals, CBG as well.    6:48 AM  No tenderness over the cervical, thoracic or lumbar spine.  No step-off or deformity.  No chest wall tenderness or deformity.  Abdomen soft.  Ambulates with steady gait.  52, laughing.  Hemodynamically stable.  Blood glucose 171.    Nolia Tschantz, Delice Bison, DO 06/23/22 [phone]

## 2022-06-23 NOTE — ED Notes (Signed)
vol/pending toc placement.. 

## 2022-06-23 NOTE — ED Provider Notes (Signed)
CT HEAD WO CONTRAST (5MM)  Result Date: 06/23/2022 CLINICAL DATA:  Trauma due to fall. EXAM: CT HEAD WITHOUT CONTRAST CT CERVICAL SPINE WITHOUT CONTRAST TECHNIQUE: Multidetector CT imaging of the head and cervical spine was performed following the standard protocol without intravenous contrast. Multiplanar CT image reconstructions of the cervical spine were also generated. RADIATION DOSE REDUCTION: This exam was performed according to the departmental dose-optimization program which includes automated exposure control, adjustment of the mA and/or kV according to patient size and/or use of iterative reconstruction technique. COMPARISON:  Four days ago FINDINGS: CT HEAD FINDINGS Brain: No evidence of acute infarction, hemorrhage, hydrocephalus, or mass lesion/mass effect. Generalized atrophy. Small low-density extra-axial collection along the left frontal convexity without significant mass effect. Vascular: No hyperdense vessel or unexpected calcification. Skull: Negative for fracture Sinuses/Orbits: No evidence of injury CT CERVICAL SPINE FINDINGS Alignment: No traumatic malalignment Skull base and vertebrae: No acute fracture. C4 right superior articular facet fragmentation is corticated and chronic. Soft tissues and spinal canal: No prevertebral fluid or swelling. No visible canal hematoma. Disc levels: Cervical spine degeneration especially affecting upper cervical facets. Upper chest: Negative IMPRESSION: No evidence of acute intracranial or cervical spine injury. Electronically Signed   By: Jorje Guild M.D.   On: 06/23/2022 07:05   CT Cervical Spine Wo Contrast  Result Date: 06/23/2022 CLINICAL DATA:  Trauma due to fall. EXAM: CT HEAD WITHOUT CONTRAST CT CERVICAL SPINE WITHOUT CONTRAST TECHNIQUE: Multidetector CT imaging of the head and cervical spine was performed following the standard protocol without intravenous contrast. Multiplanar CT image reconstructions of the cervical spine were also  generated. RADIATION DOSE REDUCTION: This exam was performed according to the departmental dose-optimization program which includes automated exposure control, adjustment of the mA and/or kV according to patient size and/or use of iterative reconstruction technique. COMPARISON:  Four days ago FINDINGS: CT HEAD FINDINGS Brain: No evidence of acute infarction, hemorrhage, hydrocephalus, or mass lesion/mass effect. Generalized atrophy. Small low-density extra-axial collection along the left frontal convexity without significant mass effect. Vascular: No hyperdense vessel or unexpected calcification. Skull: Negative for fracture Sinuses/Orbits: No evidence of injury CT CERVICAL SPINE FINDINGS Alignment: No traumatic malalignment Skull base and vertebrae: No acute fracture. C4 right superior articular facet fragmentation is corticated and chronic. Soft tissues and spinal canal: No prevertebral fluid or swelling. No visible canal hematoma. Disc levels: Cervical spine degeneration especially affecting upper cervical facets. Upper chest: Negative IMPRESSION: No evidence of acute intracranial or cervical spine injury. Electronically Signed   By: Jorje Guild M.D.   On: 06/23/2022 07:05      Delman Kitten, MD 06/23/22 1427

## 2022-06-23 NOTE — ED Notes (Signed)
Son at bedside visiting patient

## 2022-06-23 NOTE — ED Notes (Signed)
Patient pleasant sitting in bed. Patient alert.

## 2022-06-24 LAB — CBG MONITORING, ED
Glucose-Capillary: 210 mg/dL — ABNORMAL HIGH (ref 70–99)
Glucose-Capillary: 253 mg/dL — ABNORMAL HIGH (ref 70–99)
Glucose-Capillary: 166 mg/dL — ABNORMAL HIGH (ref 70–99)
Glucose-Capillary: 117 mg/dL — ABNORMAL HIGH (ref 70–99)

## 2022-06-24 MED ORDER — INSULIN ASPART 100 UNIT/ML IJ SOLN
6.0000 [IU] | Freq: Three times a day (TID) | INTRAMUSCULAR | Status: DC
  Administered 2022-06-24 – 2022-07-09 (×42): 6 [IU] via SUBCUTANEOUS
  Filled 2022-06-24 (×35): qty 1

## 2022-06-24 NOTE — ED Notes (Signed)
Dinner tray given to pt

## 2022-06-24 NOTE — ED Notes (Signed)
Pt snack and beverage provided

## 2022-06-24 NOTE — ED Notes (Signed)
Lights turned out and pt encouraged to get some sleep.

## 2022-06-24 NOTE — ED Notes (Signed)
VOL/Pending Placement 

## 2022-06-24 NOTE — ED Notes (Signed)
Pt ambulatory to quad restroom with ED tech assistance. Gait steady. No distress noted. Pt alert and calm. Bed alarm in place in case pt attempts to get up again.

## 2022-06-24 NOTE — TOC Progression Note (Addendum)
Transition of Care [facility]) - Progression Note    Patient Details  Name: Mitchell Price MRN: [id number] Date of Birth: 1945/07/13  Transition of Care [facility]) CM/SW Contact  Shelbie Hutching, RN Phone Number: 06/24/2022, 2:56 PM  Clinical Narrative:    Patient's son is supposed to be completing the Medicaid app today.  Once application is completed RNCM will reach back out to [facility] to discuss next steps and when patient can move in. Son, Mitchell Price was asking yesterday what the name of the ALF was and what was the address.  [facility] [place].   Expected Discharge Plan: Assisted Living Barriers to Discharge: ED Unsafe disposition  Expected Discharge Plan and Services   Discharge Planning Services: CM Consult   Living arrangements for the past 2 months: Single Family Home                 DME Arranged: N/A DME Agency: NA                   Social Determinants of Health (SDOH) Interventions SDOH Screenings   Tobacco Use: High Risk (06/17/2022)    Readmission Risk Interventions     No data to display

## 2022-06-24 NOTE — Inpatient Diabetes Management (Signed)
Inpatient Diabetes Program Recommendations  AACE/ADA: New Consensus Statement on Inpatient Glycemic Control   Target Ranges:  Prepandial:   less than 140 mg/dL      Peak postprandial:   less than 180 mg/dL (1-2 hours)      Critically ill patients:  140 - 180 mg/dL    Latest Reference Range & Units 06/23/22 08:34 06/23/22 11:41 06/23/22 17:01 06/23/22 21:08  Glucose-Capillary 70 - 99 mg/dL 183 (H) 215 (H) 182 (H) 231 (H)   Review of Glycemic Control  Diabetes history: DM2 Outpatient Diabetes medications: Metformin 1000 mg BID, Actos 30 mg daily, Jardiance 25 mg daily, Amaryl 4 mg daily Current orders for Inpatient glycemic control: Novolog 0-15 units TID with meals, Novolog 0-5 units QHS, Novolog 4 units TID with meals, Metformin 1000 mg BID, Actos 30 mg daily, Jardiance 25 mg daily, Amaryl 4 mg daily  Inpatient Diabetes Program Recommendations:    Insulin: Please consider increasing meal coverage to Novolog 6 units TID with meals.  Thanks, Barnie Alderman, RN, MSN, [place] Diabetes Coordinator Inpatient Diabetes Program [phone] (Team Pager from 8am to [place])

## 2022-06-24 NOTE — ED Notes (Signed)
Breakfast tray given to pt.

## 2022-06-25 LAB — CBC
HCT: 39.3 % (ref 39.0–52.0)
Hemoglobin: 12.9 g/dL — ABNORMAL LOW (ref 13.0–17.0)
MCH: 33.7 pg (ref 26.0–34.0)
MCHC: 32.8 g/dL (ref 30.0–36.0)
MCV: 102.6 fL — ABNORMAL HIGH (ref 80.0–100.0)
Platelets: 214 10*3/uL (ref 150–400)
RBC: 3.83 MIL/uL — ABNORMAL LOW (ref 4.22–5.81)
RDW: 12.4 % (ref 11.5–15.5)
WBC: 6 10*3/uL (ref 4.0–10.5)
nRBC: 0 % (ref 0.0–0.2)

## 2022-06-25 LAB — CBG MONITORING, ED
Glucose-Capillary: 164 mg/dL — ABNORMAL HIGH (ref 70–99)
Glucose-Capillary: 220 mg/dL — ABNORMAL HIGH (ref 70–99)
Glucose-Capillary: 161 mg/dL — ABNORMAL HIGH (ref 70–99)
Glucose-Capillary: 111 mg/dL — ABNORMAL HIGH (ref 70–99)

## 2022-06-25 NOTE — ED Notes (Signed)
Report to Angela, RN

## 2022-06-25 NOTE — ED Notes (Signed)
Patient given lunch tray.

## 2022-06-25 NOTE — ED Notes (Signed)
Son at bedside, encouraged patient to change clothes and shower-pt refuses.

## 2022-06-25 NOTE — ED Notes (Signed)
Fsbs 161

## 2022-06-25 NOTE — ED Notes (Signed)
Pt given nighttime snack. 

## 2022-06-25 NOTE — ED Notes (Signed)
Hospital meal provided, pt tolerated w/o complaints.  Waste discarded appropriately.  

## 2022-06-25 NOTE — ED Notes (Signed)
vol/pending toc placement.. 

## 2022-06-25 NOTE — ED Notes (Signed)
VOL/pending placement 

## 2022-06-25 NOTE — ED Notes (Signed)
Pt refusing shower or fresh set of clothing as offered multiple times by RN.

## 2022-06-25 NOTE — ED Provider Notes (Signed)
-----------------------------------------   5:52 AM on 06/25/2022 -----------------------------------------   Blood pressure 112/74, pulse 86, temperature 98.1 F (36.7 C), temperature source Oral, resp. rate 16, height 1.956 m (6\' 5" ), weight 86 kg, SpO2 96 %.  The patient is calm and cooperative at this time.  There have been no acute events since the last update.  Awaiting disposition plan from [facility] team.   Hinda Kehr, MD 06/25/22 [phone]

## 2022-06-25 NOTE — ED Notes (Signed)
Continues to refuse shower, new clothing or linens. Will continue to attempt to encourage patient with hygiene.

## 2022-06-26 LAB — CBG MONITORING, ED
Glucose-Capillary: 166 mg/dL — ABNORMAL HIGH (ref 70–99)
Glucose-Capillary: 252 mg/dL — ABNORMAL HIGH (ref 70–99)
Glucose-Capillary: 112 mg/dL — ABNORMAL HIGH (ref 70–99)
Glucose-Capillary: 146 mg/dL — ABNORMAL HIGH (ref 70–99)
Glucose-Capillary: 159 mg/dL — ABNORMAL HIGH (ref 70–99)

## 2022-06-26 NOTE — ED Notes (Signed)
Pt refused shower or change of clothes.

## 2022-06-26 NOTE — ED Notes (Signed)
Report given to Anna RN.

## 2022-06-26 NOTE — ED Provider Notes (Signed)
Emergency Medicine Observation Re-evaluation Note  Mitchell Price is a 77 y.o. male, seen on rounds today.  Pt initially presented to the ED for complaints of Fall  Currently, the patient is calm, no acute complaints.  Physical Exam  Blood pressure 102/69, pulse 85, temperature 97.7 F (36.5 C), temperature source Oral, resp. rate 18, height 6\' 5"  (1.956 m), weight 86 kg, SpO2 96 %. Physical Exam General: NAD Lungs: CTAB Psych: not agitated  ED Course / MDM  EKG:    I have reviewed the labs performed to date as well as medications administered while in observation.  Recent changes in the last 24 hours include no acute events overnight.    Plan  Current plan is for [facility] placement   Carrie Mew, MD 06/26/22 1521

## 2022-06-26 NOTE — ED Notes (Signed)
Guardian ad litem visited with patient.

## 2022-06-26 NOTE — ED Notes (Signed)
Pt given breakfast tray and drink at this time.

## 2022-06-26 NOTE — ED Notes (Signed)
VOL  TOC  PLACEMENT 

## 2022-06-26 NOTE — ED Notes (Signed)
Patient given dinner tray and diet Sprite. Patient states he has a "knot" on his buttocks. Dr. Joni Fears informed.

## 2022-06-26 NOTE — ED Notes (Signed)
NT distributed night time snack. Pt was given sandwich tray and drink

## 2022-06-26 NOTE — ED Notes (Signed)
VOL/pending TOC palcement

## 2022-06-26 NOTE — ED Notes (Signed)
Patient given rice krispies to eat per request.

## 2022-06-26 NOTE — ED Notes (Signed)
Patient asked for and was given a diet Sprite. Patient has a steady gait, and is calm and cooperative.

## 2022-06-26 NOTE — ED Notes (Signed)
Patient's son is at bedside.

## 2022-06-26 NOTE — ED Notes (Signed)
Lunch given.

## 2022-06-26 NOTE — TOC Progression Note (Signed)
Transition of Care [facility]) - Progression Note    Patient Details  Name: Mitchell Price MRN: [id number] Date of Birth: 1946-03-07  Transition of Care [facility]) CM/SW Contact  Shelbie Hutching, RN Phone Number: 06/26/2022, 3:26 PM  Clinical Narrative:    RNCM spoke with [name], son, via phone this afternoon.  Richard reports that he submitted the special assistance form this week.  Called Crystal Lissa Hoard to see if she could confirm that the app was submitted.  [facility] and he reports that once that is confirmed he can bring the patient in next week.     Expected Discharge Plan: Assisted Living Barriers to Discharge: ED Unsafe disposition  Expected Discharge Plan and Services   Discharge Planning Services: CM Consult   Living arrangements for the past 2 months: Single Family Home                 DME Arranged: N/A DME Agency: NA                   Social Determinants of Health (SDOH) Interventions SDOH Screenings   Tobacco Use: High Risk (06/17/2022)    Readmission Risk Interventions     No data to display

## 2022-06-27 LAB — CBG MONITORING, ED
Glucose-Capillary: 271 mg/dL — ABNORMAL HIGH (ref 70–99)
Glucose-Capillary: 233 mg/dL — ABNORMAL HIGH (ref 70–99)
Glucose-Capillary: 136 mg/dL — ABNORMAL HIGH (ref 70–99)

## 2022-06-27 NOTE — ED Notes (Signed)
Pt ambulated to bathroom 

## 2022-06-27 NOTE — TOC Progression Note (Signed)
Transition of Care [facility]) - Progression Note    Patient Details  Name: DAAIEL STARLIN MRN: [id number] Date of Birth: 1945/10/25  Transition of Care [facility]) CM/SW Contact  Arif Amendola [place], [place] Phone Number: 06/27/2022, 1:57 PM  Clinical Narrative:     Return phone call from patient's son confirming that he submitted the special assistance application on Tuesday.  Zola Runion, LCSW Transition of Care   Expected Discharge Plan: Assisted Living Barriers to Discharge: ED Unsafe disposition  Expected Discharge Plan and Services   Discharge Planning Services: CM Consult   Living arrangements for the past 2 months: Single Family Home                 DME Arranged: N/A DME Agency: NA                   Social Determinants of Health (SDOH) Interventions SDOH Screenings   Tobacco Use: High Risk (06/17/2022)    Readmission Risk Interventions     No data to display

## 2022-06-27 NOTE — ED Provider Notes (Signed)
-----------------------------------------   9:10 AM on 06/27/2022 -----------------------------------------   Blood pressure 98/65, pulse 81, temperature 98.3 F (36.8 C), temperature source Oral, resp. rate 19, height 1.956 m (6\' 5" ), weight 86 kg, SpO2 99 %.  The patient is calm and cooperative at this time.  There have been no acute events since the last update.  Awaiting disposition plan from [facility] team.   Hinda Kehr, MD 06/27/22 [phone]

## 2022-06-27 NOTE — ED Notes (Signed)
Patient is vol pending placement 

## 2022-06-27 NOTE — ED Notes (Addendum)
Pt asked about something to eat. Pt provided a meal tray, foam cup of diet coke with ice, foam cup of cereal and a eco-safe spoon, and a carton of milk. Pt said "Oh, thank you"  Entire snack fully consumed and trash disposed of properly. Pt denied needing anything else at this time.

## 2022-06-27 NOTE — ED Notes (Signed)
Pt given dinner tray and beverage  

## 2022-06-27 NOTE — TOC Progression Note (Addendum)
Transition of Care [facility]) - Progression Note    Patient Details  Name: ABAYOMI PATTISON MRN: [id number] Date of Birth: 1945/10/27  Transition of Care [facility]) CM/SW Contact  Tejuan Gholson [place], [place] Phone Number: 06/27/2022, 1:43 PM  Clinical Narrative:     Phone call to patient's son to confirm that the special assistance medicaid application has been completed. Left voicemail for a return call.  Lansing Sigmon, LCSW Transition of Care    Expected Discharge Plan: Assisted Living Barriers to Discharge: ED Unsafe disposition  Expected Discharge Plan and Services   Discharge Planning Services: CM Consult   Living arrangements for the past 2 months: Single Family Home                 DME Arranged: N/A DME Agency: NA                   Social Determinants of Health (SDOH) Interventions SDOH Screenings   Tobacco Use: High Risk (06/17/2022)    Readmission Risk Interventions     No data to display

## 2022-06-28 LAB — CBG MONITORING, ED
Glucose-Capillary: 253 mg/dL — ABNORMAL HIGH (ref 70–99)
Glucose-Capillary: 259 mg/dL — ABNORMAL HIGH (ref 70–99)
Glucose-Capillary: 172 mg/dL — ABNORMAL HIGH (ref 70–99)
Glucose-Capillary: 236 mg/dL — ABNORMAL HIGH (ref 70–99)
Glucose-Capillary: 175 mg/dL — ABNORMAL HIGH (ref 70–99)

## 2022-06-28 MED ORDER — LOPERAMIDE HCL 2 MG PO CAPS
2.0000 mg | ORAL_CAPSULE | Freq: Once | ORAL | Status: AC
  Administered 2022-06-28: 2 mg via ORAL
  Filled 2022-06-28: qty 1

## 2022-06-28 NOTE — ED Notes (Signed)
Pt had a bm, pericare performed. New scrubs and linen provided.

## 2022-06-28 NOTE — ED Notes (Signed)
Meal arrived lated

## 2022-06-28 NOTE — ED Notes (Signed)
Raisin Bran and milk provided to patient per request.

## 2022-06-28 NOTE — ED Notes (Signed)
Pt has significant diarrhea. MD made aware for medication.

## 2022-06-28 NOTE — ED Notes (Signed)
Patient ambulated to nurse station and asked staff member for cake to get the taste of his dinner out of his mouth. Patient assisted back to bed, informed that no cake was available. Patient refused to brush his teeth. No distress noted at this time.

## 2022-06-28 NOTE — ED Provider Notes (Signed)
06/28/2022    6:37 AM 06/27/2022    7:52 PM 06/27/2022    6:53 AM  Vitals with BMI  Systolic 712 197 98  Diastolic 60 77 65  Pulse 83 84 81    Patient resting during my shift.  No acute events in the last 24 hours.  He is awaiting social work disposition.   Rada Hay, MD 06/28/22 [phone]

## 2022-06-28 NOTE — ED Notes (Signed)
VOL / pending TOC placement 

## 2022-06-28 NOTE — ED Notes (Signed)
Patient sitting upright in bed eating dinner. Denies pain or needs. Resp even, unlabored on RA. No distress noted at this time.

## 2022-06-28 NOTE — ED Notes (Signed)
Patient moved to rm 2.  Report given to [facility], Therapist, sports.

## 2022-06-28 NOTE — ED Notes (Signed)
Pt brought to ed rm 2 at this time, this RN now assuming care.  

## 2022-06-29 LAB — CBG MONITORING, ED
Glucose-Capillary: 174 mg/dL — ABNORMAL HIGH (ref 70–99)
Glucose-Capillary: 197 mg/dL — ABNORMAL HIGH (ref 70–99)
Glucose-Capillary: 122 mg/dL — ABNORMAL HIGH (ref 70–99)
Glucose-Capillary: 228 mg/dL — ABNORMAL HIGH (ref 70–99)

## 2022-06-29 NOTE — ED Notes (Signed)
VOL/Pending TOC Placement 

## 2022-06-29 NOTE — Progress Notes (Signed)
   06/29/22 1400  Spiritual Encounters  Type of Visit Initial  Care provided to: Patient  Referral source Chaplain team  Reason for visit Routine spiritual support  OnCall Visit Yes   Chaplain introduced chaplain services and provided compassionate presence. Chaplain services are available for follow up as needed.

## 2022-06-29 NOTE — ED Notes (Signed)
Pt ambulated to toilet and BM cleaned from patient. New brief placed and patient walked back to bed.

## 2022-06-29 NOTE — ED Notes (Signed)
Hospital meal and beverage provided

## 2022-06-29 NOTE — ED Notes (Signed)
Pt given snack and water to drink   

## 2022-06-29 NOTE — ED Notes (Signed)
TOC pending placement

## 2022-06-29 NOTE — ED Notes (Signed)
Pt ate 100% of meal.

## 2022-06-29 NOTE — ED Notes (Signed)
Patient was given a cup of icecream.

## 2022-06-29 NOTE — ED Notes (Signed)
PT given dinner tray; provided by dietary.

## 2022-06-29 NOTE — ED Notes (Signed)
Pt given lunch tray and water at this time. 

## 2022-06-29 NOTE — ED Notes (Signed)
Assumed care of patient.

## 2022-06-29 NOTE — ED Provider Notes (Signed)
06/28/2022    8:47 PM 06/28/2022    3:30 PM 06/28/2022    2:30 PM  Vitals with BMI  Systolic 197 588 325  Diastolic 79 70 57  Pulse 80 81 82    Patient is sitting up in the hallway stretcher no distress noted.  There have been no changes to his care plan during my shift.  He is pending disposition by social work.   Rada Hay, MD 06/29/22 0530

## 2022-06-30 LAB — CBG MONITORING, ED
Glucose-Capillary: 215 mg/dL — ABNORMAL HIGH (ref 70–99)
Glucose-Capillary: 366 mg/dL — ABNORMAL HIGH (ref 70–99)
Glucose-Capillary: 200 mg/dL — ABNORMAL HIGH (ref 70–99)
Glucose-Capillary: 122 mg/dL — ABNORMAL HIGH (ref 70–99)

## 2022-06-30 NOTE — ED Notes (Signed)
Lunch tray given to pt.

## 2022-06-30 NOTE — ED Notes (Signed)
Pt provided a shower and oral hygiene by Anda Kraft, RN & Mayra, NT.

## 2022-06-30 NOTE — ED Provider Notes (Signed)
-----------------------------------------   6:00 AM on 06/30/2022 -----------------------------------------   Blood pressure 110/65, pulse 78, temperature 97.6 F (36.4 C), temperature source Oral, resp. rate 18, height 6\' 5"  (1.956 m), weight 86 kg, SpO2 98 %.  The patient is calm and cooperative at this time.  There have been no acute events since the last update.  Awaiting disposition plan from social work team.    Arta Silence, MD 06/30/22 0600

## 2022-06-30 NOTE — ED Notes (Signed)
vol/pending toc placement.. 

## 2022-07-01 LAB — CBG MONITORING, ED
Glucose-Capillary: 292 mg/dL — ABNORMAL HIGH (ref 70–99)
Glucose-Capillary: 244 mg/dL — ABNORMAL HIGH (ref 70–99)
Glucose-Capillary: 186 mg/dL — ABNORMAL HIGH (ref 70–99)
Glucose-Capillary: 100 mg/dL — ABNORMAL HIGH (ref 70–99)

## 2022-07-01 NOTE — ED Notes (Signed)
Computer used to turn on a TV show for the patients enjoyment.

## 2022-07-01 NOTE — ED Notes (Signed)
VOL/TOC Placement 

## 2022-07-01 NOTE — Inpatient Diabetes Management (Signed)
  Inpatient Diabetes Program Recommendations  AACE/ADA: New Consensus Statement on Inpatient Glycemic Control   Target Ranges:  Prepandial:   less than 140 mg/dL      Peak postprandial:   less than 180 mg/dL (1-2 hours)      Critically ill patients:  140 - 180 mg/dL    Latest Reference Range & Units 06/30/22 09:08 06/30/22 11:35 06/30/22 16:19 06/30/22 21:00 07/01/22 08:06  Glucose-Capillary 70 - 99 mg/dL 215 (H) 366 (H) 200 (H) 122 (H) 292 (H)   Review of Glycemic Control  Diabetes history: DM2 Outpatient Diabetes medications: Metformin 1000 mg BID, Actos 30 mg daily, Jardiance 25 mg daily, Amaryl 4 mg daily Current orders for Inpatient glycemic control: Novolog 0-15 units TID with meals, Novolog 0-5 units QHS, Novolog 6 units TID with meals, Metformin 1000 mg BID, Actos 30 mg daily, Jardiance 25 mg daily, Amaryl 4 mg daily   Inpatient Diabetes Program Recommendations:     Insulin: Please consider increasing meal coverage to Novolog 9 units TID with meals.  Thanks, Barnie Alderman, RN, MSN, [place] Diabetes Coordinator Inpatient Diabetes Program [phone] (Team Pager from 8am to [place])

## 2022-07-01 NOTE — ED Notes (Signed)
Pt escorted to the restroom with standby assist from this RN and provided a sandwich tray. No other concerns at this time.

## 2022-07-01 NOTE — ED Provider Notes (Signed)
-----------------------------------------   5:43 AM on 07/01/2022 -----------------------------------------   Blood pressure 126/70, pulse 71, temperature 98.5 F (36.9 C), temperature source Oral, resp. rate 18, height 6\' 5"  (1.956 m), weight 86 kg, SpO2 98 %.  The patient is calm and cooperative at this time.  There have been no acute events since the last update.  Awaiting disposition plan from case management/social work.    Jayren Cease, Delice Bison, DO 07/01/22 [phone]

## 2022-07-01 NOTE — TOC Progression Note (Signed)
Transition of Care [facility]) - Progression Note    Patient Details  Name: TIN ENGRAM MRN: [id number] Date of Birth: Mar 09, 1946  [facility] Care [facility]) CM/SW Contact  Shelbie Hutching, RN Phone Number: 07/01/2022, 3:35 PM  Clinical Narrative:    Left a message for [name] with [place] DSS- she was going to see if the LTC Medicaid application could be used for the special assistance application.  Once the special assistance application has been confirmed [facility] will accept the patient.     Expected Discharge Plan: Assisted Living Barriers to Discharge: ED Unsafe disposition  Expected Discharge Plan and Services   Discharge Planning Services: CM Consult   Living arrangements for the past 2 months: Single Family Home                 DME Arranged: N/A DME Agency: NA                   Social Determinants of Health (SDOH) Interventions SDOH Screenings   Tobacco Use: High Risk (06/17/2022)    Readmission Risk Interventions     No data to display

## 2022-07-01 NOTE — ED Notes (Addendum)
The pt is resting comfortably in the recliner. The pt is warm, pink, and dry with his head slightly elevated.

## 2022-07-02 LAB — CBG MONITORING, ED
Glucose-Capillary: 379 mg/dL — ABNORMAL HIGH (ref 70–99)
Glucose-Capillary: 74 mg/dL (ref 70–99)
Glucose-Capillary: 84 mg/dL (ref 70–99)
Glucose-Capillary: 228 mg/dL — ABNORMAL HIGH (ref 70–99)

## 2022-07-02 NOTE — ED Notes (Signed)
Pt assisted to the bathroom; Pt had a large bowel moment and provided a new set of scrubs and underwear. Pt was also given a meal tray and water.

## 2022-07-02 NOTE — Inpatient Diabetes Management (Signed)
Inpatient Diabetes Program Recommendations  AACE/ADA: New Consensus Statement on Inpatient Glycemic Control   Target Ranges:  Prepandial:   less than 140 mg/dL      Peak postprandial:   less than 180 mg/dL (1-2 hours)      Critically ill patients:  140 - 180 mg/dL    Latest Reference Range & Units 07/01/22 08:06 07/01/22 12:42 07/01/22 18:33 07/01/22 21:59 07/02/22 08:01  Glucose-Capillary 70 - 99 mg/dL 292 (H) 244 (H) 186 (H) 100 (H) 379 (H)   Review of Glycemic Control  Diabetes history: DM2 Outpatient Diabetes medications: Metformin 1000 mg BID, Actos 30 mg daily, Jardiance 25 mg daily, Amaryl 4 mg daily Current orders for Inpatient glycemic control: Novolog 0-15 units TID with meals, Novolog 0-5 units QHS, Novolog 6 units TID with meals, Metformin 1000 mg BID, Actos 30 mg daily, Jardiance 25 mg daily, Amaryl 4 mg daily   Inpatient Diabetes Program Recommendations:     Insulin: Please consider increasing meal coverage to Novolog 8 units TID with meals and ordering Semglee 5 units Q24H.  Thanks, Barnie Alderman, RN, MSN, [place] Diabetes Coordinator Inpatient Diabetes Program [phone] (Team Pager from 8am to [place])

## 2022-07-02 NOTE — ED Notes (Signed)
Pt ambulated to restroom, steady gait.

## 2022-07-02 NOTE — ED Notes (Signed)
Meal tray at bedside, pt sitting up eating

## 2022-07-02 NOTE — ED Notes (Signed)
Pt ate adequate amount of food for insulin to be given

## 2022-07-02 NOTE — ED Notes (Signed)
When this took over care, pt is laying down sleeping at this time with no s/s of distress.

## 2022-07-02 NOTE — ED Notes (Signed)
Pt ate entire meal tray

## 2022-07-02 NOTE — ED Notes (Signed)
Pt given graham crackers with peanut butter, and black decaf coffee. No other needs at this time.

## 2022-07-02 NOTE — ED Notes (Signed)
Walked patient to the restroom. Gave patient a cup of coffee he requested. Patient is sitting up in bed drinking his cup of coffee.

## 2022-07-02 NOTE — ED Notes (Addendum)
Pt's BG 74. Will hold 6 units of insulin until pt has eaten over half of his meal per order

## 2022-07-03 LAB — CBG MONITORING, ED
Glucose-Capillary: 254 mg/dL — ABNORMAL HIGH (ref 70–99)
Glucose-Capillary: 157 mg/dL — ABNORMAL HIGH (ref 70–99)
Glucose-Capillary: 209 mg/dL — ABNORMAL HIGH (ref 70–99)
Glucose-Capillary: 166 mg/dL — ABNORMAL HIGH (ref 70–99)

## 2022-07-03 MED ORDER — INSULIN GLARGINE-YFGN 100 UNIT/ML ~~LOC~~ SOLN
5.0000 [IU] | Freq: Every day | SUBCUTANEOUS | Status: DC
  Administered 2022-07-03 – 2022-07-09 (×7): 5 [IU] via SUBCUTANEOUS
  Filled 2022-07-03 (×7): qty 0.05

## 2022-07-03 NOTE — ED Notes (Signed)
Pt was given new pants

## 2022-07-03 NOTE — ED Provider Notes (Signed)
-----------------------------------------   6:22 AM on 07/03/2022 -----------------------------------------   Blood pressure (!) 98/58, pulse 83, temperature 98.7 F (37.1 C), temperature source Oral, resp. rate 18, height 6\' 5"  (1.956 m), weight 86 kg, SpO2 98 %.  The patient is calm and cooperative at this time.  There have been no acute events since the last update.  Awaiting disposition plan from Social Work team.   Paulette Blanch, MD 07/03/22 [phone]

## 2022-07-03 NOTE — Inpatient Diabetes Management (Signed)
Inpatient Diabetes Program Recommendations  AACE/ADA: New Consensus Statement on Inpatient Glycemic Control (2015)  Target Ranges:  Prepandial:   less than 140 mg/dL      Peak postprandial:   less than 180 mg/dL (1-2 hours)      Critically ill patients:  140 - 180 mg/dL    Latest Reference Range & Units 07/02/22 08:01 07/02/22 11:59 07/02/22 17:14 07/02/22 21:43  Glucose-Capillary 70 - 99 mg/dL 379 (H)  21 units Novolog  74  6 units Novolog  84  6 units Novolog  228 (H)  2 units Novolog   (H): Data is abnormally high  Latest Reference Range & Units 07/03/22 07:31  Glucose-Capillary 70 - 99 mg/dL 254 (H)  11 units Novolog   (H): Data is abnormally high   Diabetes history: DM2  Home DM Meds: Metformin 1000 mg BID, Actos 30 mg daily, Jardiance 25 mg daily, Amaryl 4 mg daily  Current orders: Novolog 0-15 units TID ac/hs   Novolog 6 units TID with meals   Metformin 1000 mg BID   Actos 30 mg daily   Jardiance 25 mg daily   Amaryl 4 mg daily    MD- Note AM CBGs have been >250 the last 2 days  Please consider adding basal insulin: Semglee 5 units Daily     --Will follow patient during hospitalization--  Wyn Quaker RN, MSN, [place] Diabetes Coordinator Inpatient Glycemic Control Team Team Pager: [phone] (8a-5p)

## 2022-07-03 NOTE — ED Notes (Signed)
Full linen bed change done including new sheets, new flat sheet, and new warm blankets, bedside table wiped off and disinfected.

## 2022-07-03 NOTE — ED Notes (Signed)
Pt given cereal upon request. Pt in NAD at this time.

## 2022-07-04 LAB — CBG MONITORING, ED
Glucose-Capillary: 286 mg/dL — ABNORMAL HIGH (ref 70–99)
Glucose-Capillary: 182 mg/dL — ABNORMAL HIGH (ref 70–99)
Glucose-Capillary: 115 mg/dL — ABNORMAL HIGH (ref 70–99)
Glucose-Capillary: 217 mg/dL — ABNORMAL HIGH (ref 70–99)

## 2022-07-04 NOTE — ED Notes (Signed)
vol/toc.Marland KitchenMarland KitchenMarland Kitchen

## 2022-07-04 NOTE — ED Notes (Signed)
Ptient is vol pending TOC placement

## 2022-07-04 NOTE — ED Notes (Signed)
Pt given a sandwich tray ?

## 2022-07-04 NOTE — ED Notes (Signed)
Pt asking for cereal. Given Rice Krispies and milk.

## 2022-07-04 NOTE — ED Provider Notes (Signed)
-----------------------------------------   5:50 AM on 07/04/2022 -----------------------------------------   Blood pressure 110/69, pulse 82, temperature 98.1 F (36.7 C), temperature source Oral, resp. rate 16, height 1.956 m (6\' 5" ), weight 86 kg, SpO2 95 %.  The patient is calm and cooperative at this time.  There have been no acute events since the last update.  Awaiting disposition plan from [facility] team.  Of note, he had a couple of relatively low blood pressure measurements, but I suspect these were erroneous.  He has been up and ambulating around the emergency department, having no complaints, and at my request his nurse specifically rechecked his vital signs and they are all very reassuring.  No indication to repeat labs at this time.     Hinda Kehr, MD 07/04/22 [phone]

## 2022-07-05 LAB — CBG MONITORING, ED
Glucose-Capillary: 201 mg/dL — ABNORMAL HIGH (ref 70–99)
Glucose-Capillary: 258 mg/dL — ABNORMAL HIGH (ref 70–99)
Glucose-Capillary: 130 mg/dL — ABNORMAL HIGH (ref 70–99)
Glucose-Capillary: 165 mg/dL — ABNORMAL HIGH (ref 70–99)

## 2022-07-05 LAB — CBC
HCT: 36.4 % — ABNORMAL LOW (ref 39.0–52.0)
Hemoglobin: 12.1 g/dL — ABNORMAL LOW (ref 13.0–17.0)
MCH: 33.8 pg (ref 26.0–34.0)
MCHC: 33.2 g/dL (ref 30.0–36.0)
MCV: 101.7 fL — ABNORMAL HIGH (ref 80.0–100.0)
Platelets: 265 10*3/uL (ref 150–400)
RBC: 3.58 MIL/uL — ABNORMAL LOW (ref 4.22–5.81)
RDW: 12.6 % (ref 11.5–15.5)
WBC: 4.7 10*3/uL (ref 4.0–10.5)
nRBC: 0 % (ref 0.0–0.2)

## 2022-07-05 NOTE — ED Notes (Signed)
TOC

## 2022-07-05 NOTE — ED Notes (Signed)
Sandwich box given to pt per pt request. 

## 2022-07-05 NOTE — ED Provider Notes (Signed)
Emergency Medicine Observation Re-evaluation Note  Mitchell Price is a 77 y.o. male, currently boarding in the emergency department awaiting placement by social work.  No acute events since last update  Physical Exam  BP 117/75   Pulse 84   Temp 98.7 F (37.1 C) (Oral)   Resp 18   Ht 6\' 5"  (1.956 m)   Wt 86 kg   SpO2 99%   BMI 22.48 kg/m    ED Course / MDM   Patient has had recent CBG levels in the 100-250 range.  CBC reassuring no other recent lab work for review.  Plan  Current plan is for placement to an appropriate living facility once available.  Social work is working with the patient.    Harvest Dark, MD 07/05/22 2002

## 2022-07-05 NOTE — ED Notes (Signed)
Pt sitting up at this time watching a movie. Denies needs at this time.

## 2022-07-06 LAB — CBG MONITORING, ED
Glucose-Capillary: 256 mg/dL — ABNORMAL HIGH (ref 70–99)
Glucose-Capillary: 195 mg/dL — ABNORMAL HIGH (ref 70–99)
Glucose-Capillary: 318 mg/dL — ABNORMAL HIGH (ref 70–99)
Glucose-Capillary: 154 mg/dL — ABNORMAL HIGH (ref 70–99)

## 2022-07-06 NOTE — ED Notes (Signed)
Meal given

## 2022-07-06 NOTE — ED Notes (Signed)
vol/pending toc placement.. 

## 2022-07-06 NOTE — TOC Progression Note (Signed)
Transition of Care [facility]) - Progression Note    Patient Details  Name: Mitchell Price MRN: [id number] Date of Birth: August 23, 1945  Transition of Care [facility]) CM/SW Contact  Shelbie Hutching, RN Phone Number: 07/06/2022, 3:32 PM  Clinical Narrative:    Donella Stade with [place] [facility] trying to get in touch with Medicaid worker to verify application.  Once app verified [facility] will accept.     Expected Discharge Plan: Assisted Living Barriers to Discharge: ED Unsafe disposition  Expected Discharge Plan and Services   Discharge Planning Services: CM Consult   Living arrangements for the past 2 months: Single Family Home                 DME Arranged: N/A DME Agency: NA                   Social Determinants of Health (SDOH) Interventions SDOH Screenings   Tobacco Use: High Risk (06/17/2022)    Readmission Risk Interventions     No data to display

## 2022-07-06 NOTE — ED Notes (Signed)
VOL  TOC  PLACEMENT 

## 2022-07-06 NOTE — TOC Progression Note (Signed)
Transition of Care [facility]) - Progression Note    Patient Details  Name: Mitchell Price MRN: [id number] Date of Birth: [birth year]  Transition of Care [facility]) CM/SW Contact  Shelbie Hutching, RN Phone Number: 07/06/2022, 11:43 AM  Clinical Narrative:    RNCM spoke with [facility] at the Group home this morning, he was checking in on the patients status.  Reached out to [employer] with [place] [facility]- she is checking on the special assistance application and will call Kasey.  Plan is to get patient discharged this week.    Expected Discharge Plan: Assisted Living Barriers to Discharge: ED Unsafe disposition  Expected Discharge Plan and Services   Discharge Planning Services: CM Consult   Living arrangements for the past 2 months: Single Family Home                 DME Arranged: N/A DME Agency: NA                   Social Determinants of Health (SDOH) Interventions SDOH Screenings   Tobacco Use: High Risk (06/17/2022)    Readmission Risk Interventions     No data to display

## 2022-07-06 NOTE — ED Notes (Signed)
Pt ambulated to the bathroom and is now sitting on his bed. Pt encouraged to go back to sleep as it is 0230am. Pt sts he'll lay down again soon.

## 2022-07-06 NOTE — ED Provider Notes (Signed)
-----------------------------------------   4:59 AM on 07/06/2022 -----------------------------------------   Blood pressure 117/75, pulse 84, temperature 98.7 F (37.1 C), temperature source Oral, resp. rate 18, height 1.956 m (6\' 5" ), weight 86 kg, SpO2 99 %.  The patient is calm and cooperative at this time.  There have been no acute events since the last update.  Awaiting disposition plan from [facility] team.  Of note, I just now noticed that the patient never had the "place in ED boarder status" order when he switched from psychiatry to [facility].  I placed the order just now.   Hinda Kehr, MD 07/06/22 0500

## 2022-07-07 LAB — CBG MONITORING, ED
Glucose-Capillary: 196 mg/dL — ABNORMAL HIGH (ref 70–99)
Glucose-Capillary: 191 mg/dL — ABNORMAL HIGH (ref 70–99)
Glucose-Capillary: 214 mg/dL — ABNORMAL HIGH (ref 70–99)
Glucose-Capillary: 252 mg/dL — ABNORMAL HIGH (ref 70–99)

## 2022-07-07 NOTE — ED Notes (Signed)
Vol / pending TOC placement 

## 2022-07-07 NOTE — ED Notes (Signed)
This RN encouraged and offered shower to patient. Pt declined at this time

## 2022-07-07 NOTE — ED Notes (Signed)
Pt agreed to take a shower. Pt assisted with shower by Faith, NT. New linen and scrubs provided

## 2022-07-07 NOTE — ED Provider Notes (Signed)
-----------------------------------------   5:44 AM on 07/07/2022 -----------------------------------------   Blood pressure 114/74, pulse 72, temperature 98.5 F (36.9 C), temperature source Oral, resp. rate 17, height 6\' 5"  (1.956 m), weight 86 kg, SpO2 97 %.  The patient is calm and cooperative at this time.  There have been no acute events since the last update.  Awaiting disposition plan from Social Work team.   Paulette Blanch, MD 07/07/22 [phone]

## 2022-07-08 ENCOUNTER — Emergency Department: Payer: Medicare HMO

## 2022-07-08 ENCOUNTER — Encounter: Payer: Self-pay | Admitting: Radiology

## 2022-07-08 DIAGNOSIS — E1165 Type 2 diabetes mellitus with hyperglycemia: Secondary | ICD-10-CM

## 2022-07-08 DIAGNOSIS — E785 Hyperlipidemia, unspecified: Secondary | ICD-10-CM

## 2022-07-08 DIAGNOSIS — R197 Diarrhea, unspecified: Secondary | ICD-10-CM | POA: Diagnosis not present

## 2022-07-08 DIAGNOSIS — Z794 Long term (current) use of insulin: Secondary | ICD-10-CM

## 2022-07-08 DIAGNOSIS — N179 Acute kidney failure, unspecified: Secondary | ICD-10-CM | POA: Diagnosis not present

## 2022-07-08 DIAGNOSIS — Z86718 Personal history of other venous thrombosis and embolism: Secondary | ICD-10-CM

## 2022-07-08 DIAGNOSIS — I10 Essential (primary) hypertension: Secondary | ICD-10-CM

## 2022-07-08 DIAGNOSIS — E1140 Type 2 diabetes mellitus with diabetic neuropathy, unspecified: Secondary | ICD-10-CM | POA: Insufficient documentation

## 2022-07-08 LAB — GLUCOSE, CAPILLARY
Glucose-Capillary: 228 mg/dL — ABNORMAL HIGH (ref 70–99)
Glucose-Capillary: 245 mg/dL — ABNORMAL HIGH (ref 70–99)

## 2022-07-08 LAB — CBC WITH DIFFERENTIAL/PLATELET
Abs Immature Granulocytes: 0.02 10*3/uL (ref 0.00–0.07)
Basophils Absolute: 0 10*3/uL (ref 0.0–0.1)
Basophils Relative: 0 %
Eosinophils Absolute: 0.2 10*3/uL (ref 0.0–0.5)
Eosinophils Relative: 4 %
HCT: 37.8 % — ABNORMAL LOW (ref 39.0–52.0)
Hemoglobin: 12.3 g/dL — ABNORMAL LOW (ref 13.0–17.0)
Immature Granulocytes: 0 %
Lymphocytes Relative: 29 %
Lymphs Abs: 1.6 10*3/uL (ref 0.7–4.0)
MCH: 33.4 pg (ref 26.0–34.0)
MCHC: 32.5 g/dL (ref 30.0–36.0)
MCV: 102.7 fL — ABNORMAL HIGH (ref 80.0–100.0)
Monocytes Absolute: 0.7 10*3/uL (ref 0.1–1.0)
Monocytes Relative: 12 %
Neutro Abs: 3 10*3/uL (ref 1.7–7.7)
Neutrophils Relative %: 55 %
Platelets: 249 10*3/uL (ref 150–400)
RBC: 3.68 MIL/uL — ABNORMAL LOW (ref 4.22–5.81)
RDW: 12.8 % (ref 11.5–15.5)
WBC: 5.5 10*3/uL (ref 4.0–10.5)
nRBC: 0 % (ref 0.0–0.2)

## 2022-07-08 LAB — COMPREHENSIVE METABOLIC PANEL
ALT: 15 U/L (ref 0–44)
AST: 21 U/L (ref 15–41)
Albumin: 3.9 g/dL (ref 3.5–5.0)
Alkaline Phosphatase: 101 U/L (ref 38–126)
Anion gap: 8 (ref 5–15)
BUN: 41 mg/dL — ABNORMAL HIGH (ref 8–23)
CO2: 26 mmol/L (ref 22–32)
Calcium: 9.4 mg/dL (ref 8.9–10.3)
Chloride: 102 mmol/L (ref 98–111)
Creatinine, Ser: 1.52 mg/dL — ABNORMAL HIGH (ref 0.61–1.24)
GFR, Estimated: 47 mL/min — ABNORMAL LOW (ref >60)
Glucose, Bld: 237 mg/dL — ABNORMAL HIGH (ref 70–99)
Potassium: 4.9 mmol/L (ref 3.5–5.1)
Sodium: 136 mmol/L (ref 135–145)
Total Bilirubin: 0.7 mg/dL (ref 0.3–1.2)
Total Protein: 7.5 g/dL (ref 6.5–8.1)

## 2022-07-08 LAB — URINALYSIS, ROUTINE W REFLEX MICROSCOPIC
Bacteria, UA: NONE SEEN
Bilirubin Urine: NEGATIVE
Glucose, UA: >=500 mg/dL — AB
Hgb urine dipstick: NEGATIVE
Ketones, ur: NEGATIVE mg/dL
Nitrite: NEGATIVE
Protein, ur: NEGATIVE mg/dL
Specific Gravity, Urine: 1.028 (ref 1.005–1.030)
pH: 5 (ref 5.0–8.0)

## 2022-07-08 LAB — CBC
HCT: 35.3 % — ABNORMAL LOW (ref 39.0–52.0)
Hemoglobin: 11.5 g/dL — ABNORMAL LOW (ref 13.0–17.0)
MCH: 33.4 pg (ref 26.0–34.0)
MCHC: 32.6 g/dL (ref 30.0–36.0)
MCV: 102.6 fL — ABNORMAL HIGH (ref 80.0–100.0)
Platelets: 228 10*3/uL (ref 150–400)
RBC: 3.44 MIL/uL — ABNORMAL LOW (ref 4.22–5.81)
RDW: 12.9 % (ref 11.5–15.5)
WBC: 5.4 10*3/uL (ref 4.0–10.5)
nRBC: 0 % (ref 0.0–0.2)

## 2022-07-08 LAB — BASIC METABOLIC PANEL
Anion gap: 7 (ref 5–15)
BUN: 38 mg/dL — ABNORMAL HIGH (ref 8–23)
CO2: 23 mmol/L (ref 22–32)
Calcium: 8.7 mg/dL — ABNORMAL LOW (ref 8.9–10.3)
Chloride: 105 mmol/L (ref 98–111)
Creatinine, Ser: 1.31 mg/dL — ABNORMAL HIGH (ref 0.61–1.24)
GFR, Estimated: 56 mL/min — ABNORMAL LOW (ref >60)
Glucose, Bld: 208 mg/dL — ABNORMAL HIGH (ref 70–99)
Potassium: 4.9 mmol/L (ref 3.5–5.1)
Sodium: 135 mmol/L (ref 135–145)

## 2022-07-08 LAB — HEMOGLOBIN AND HEMATOCRIT, BLOOD
HCT: 36.3 % — ABNORMAL LOW (ref 39.0–52.0)
HCT: 36.4 % — ABNORMAL LOW (ref 39.0–52.0)
Hemoglobin: 11.8 g/dL — ABNORMAL LOW (ref 13.0–17.0)
Hemoglobin: 12.1 g/dL — ABNORMAL LOW (ref 13.0–17.0)

## 2022-07-08 LAB — RESP PANEL BY RT-PCR (RSV, FLU A&B, COVID)  RVPGX2
Influenza A by PCR: NEGATIVE
Influenza B by PCR: NEGATIVE
Resp Syncytial Virus by PCR: NEGATIVE
SARS Coronavirus 2 by RT PCR: NEGATIVE

## 2022-07-08 LAB — CBG MONITORING, ED
Glucose-Capillary: 216 mg/dL — ABNORMAL HIGH (ref 70–99)
Glucose-Capillary: 101 mg/dL — ABNORMAL HIGH (ref 70–99)

## 2022-07-08 MED ORDER — LACTATED RINGERS IV BOLUS
1000.0000 mL | Freq: Once | INTRAVENOUS | Status: AC
  Administered 2022-07-08: 1000 mL via INTRAVENOUS

## 2022-07-08 MED ORDER — QUETIAPINE FUMARATE 25 MG PO TABS
25.0000 mg | ORAL_TABLET | Freq: Two times a day (BID) | ORAL | Status: DC
  Administered 2022-07-08 – 2022-07-23 (×29): 25 mg via ORAL
  Filled 2022-07-08 (×31): qty 1

## 2022-07-08 MED ORDER — TRAZODONE HCL 50 MG PO TABS
25.0000 mg | ORAL_TABLET | Freq: Every evening | ORAL | Status: DC | PRN
  Administered 2022-07-08 – 2022-07-10 (×2): 25 mg via ORAL
  Filled 2022-07-08 (×2): qty 1

## 2022-07-08 MED ORDER — METFORMIN HCL 500 MG PO TABS: 1000.0000 mg | ORAL_TABLET | Freq: Two times a day (BID) | ORAL | Status: DC

## 2022-07-08 MED ORDER — ONDANSETRON HCL 4 MG PO TABS: 4.0000 mg | ORAL_TABLET | Freq: Four times a day (QID) | ORAL | Status: DC | PRN

## 2022-07-08 MED ORDER — SODIUM CHLORIDE 0.9 % IV SOLN: INTRAVENOUS | Status: DC

## 2022-07-08 MED ORDER — LORAZEPAM 2 MG PO TABS
2.0000 mg | ORAL_TABLET | Freq: Four times a day (QID) | ORAL | Status: DC | PRN
  Administered 2022-07-08 – 2022-07-14 (×4): 2 mg via ORAL
  Filled 2022-07-08 (×6): qty 1

## 2022-07-08 MED ORDER — ONDANSETRON HCL 4 MG/2ML IJ SOLN: 4.0000 mg | Freq: Four times a day (QID) | INTRAMUSCULAR | Status: DC | PRN

## 2022-07-08 MED ORDER — LOPERAMIDE HCL 2 MG PO CAPS
2.0000 mg | ORAL_CAPSULE | ORAL | Status: DC | PRN
  Administered 2022-07-21 (×2): 2 mg via ORAL
  Filled 2022-07-08 (×2): qty 1

## 2022-07-08 MED ORDER — HALOPERIDOL LACTATE 5 MG/ML IJ SOLN
2.0000 mg | Freq: Four times a day (QID) | INTRAMUSCULAR | Status: AC | PRN
  Administered 2022-07-08 – 2022-07-11 (×3): 2 mg via INTRAVENOUS
  Filled 2022-07-08 (×3): qty 1

## 2022-07-08 MED ORDER — LORAZEPAM 2 MG/ML PO CONC: 2.0000 mg | Freq: Four times a day (QID) | ORAL | Status: DC | PRN

## 2022-07-08 MED ORDER — IOHEXOL 300 MG/ML  SOLN
100.0000 mL | Freq: Once | INTRAMUSCULAR | Status: AC | PRN
  Administered 2022-07-08: 100 mL via INTRAVENOUS

## 2022-07-08 MED ORDER — ACETAMINOPHEN 650 MG RE SUPP: 650.0000 mg | Freq: Four times a day (QID) | RECTAL | Status: DC | PRN

## 2022-07-08 MED ORDER — ACETAMINOPHEN 325 MG PO TABS
650.0000 mg | ORAL_TABLET | Freq: Four times a day (QID) | ORAL | Status: DC | PRN
  Administered 2022-07-11 – 2022-07-17 (×4): 650 mg via ORAL
  Filled 2022-07-08 (×6): qty 2

## 2022-07-08 MED ORDER — FERROUS SULFATE 325 (65 FE) MG PO TABS
325.0000 mg | ORAL_TABLET | Freq: Every day | ORAL | Status: DC
  Administered 2022-07-08 – 2022-07-23 (×16): 325 mg via ORAL
  Filled 2022-07-08 (×16): qty 1

## 2022-07-08 MED ORDER — ONDANSETRON HCL 4 MG/2ML IJ SOLN
4.0000 mg | Freq: Once | INTRAMUSCULAR | Status: AC
  Administered 2022-07-08: 4 mg via INTRAVENOUS
  Filled 2022-07-08: qty 2

## 2022-07-08 NOTE — ED Notes (Signed)
Pt trying to get up to go smoke. Redirected back to bed. Pt continued agitation.

## 2022-07-08 NOTE — ED Notes (Signed)
Pt has not received meal tray yet. Insulin not to be given until patient has eaten

## 2022-07-08 NOTE — TOC Progression Note (Signed)
Transition of Care [facility]) - Progression Note    Patient Details  Name: Mitchell Price MRN: [id number] Date of Birth: 08/08/1945  Transition [facility] Care [facility]) CM/SW Contact  Shelbie Hutching, RN Phone Number: 07/08/2022, 9:17 AM  Clinical Narrative:    Patient's son has guardianship of patient since last week.  Son, [name], has been trying to get in touch with the Medicaid worker at [place] and last he heard it is in process.  Patient is being placed under observation for AKI due to diarrhea.  Reviewed MOON with son, verbalized understanding via phone.   Requested that the son bring in the guardianship paperwork to be added to his record.     Expected Discharge Plan: Assisted Living Barriers to Discharge: ED Unsafe disposition  Expected Discharge Plan and Services   Discharge Planning Services: CM Consult   Living arrangements for the past 2 months: Single Family Home                 DME Arranged: N/A DME Agency: NA                   Social Determinants of Health (SDOH) Interventions SDOH Screenings   Tobacco Use: High Risk (07/08/2022)    Readmission Risk Interventions     No data to display

## 2022-07-08 NOTE — Assessment & Plan Note (Addendum)
-  This is like prerenal secondary to his recurrent diarrhea could be related to acute enteritis. - The patient will be admitted to an observation medical bed. - Continue hydration with IV normal saline. - We will follow BMPs. - We will avoid nephrotoxins.

## 2022-07-08 NOTE — ED Notes (Signed)
Beth, RN here to transport patient at this time. Pt stable at time of departure.

## 2022-07-08 NOTE — ED Notes (Signed)
Patient with multiple episodes of diarrhea through the night. Dr. Tamala Julian notified.

## 2022-07-08 NOTE — ED Notes (Signed)
Pt eating at this time.

## 2022-07-08 NOTE — Progress Notes (Signed)
PROGRESS NOTE  BOONE GEAR    DOB: Feb 25, 1946, 77 y.o.  IRJ:[id number]    Code Status: Full Code   DOA: 06/17/2022   LOS: 0   Brief hospital course  Mitchell Price is a 77 y.o. male with a PMH significant for type  2 diabetes mellitus, essential hypertension, history of DVT on Eliquis, history of PUD, diverticulosis, type diabetes mellitus and dyslipidemia.  They presented from ED to the ED on 06/17/2022 with intractable diarrhea x 1 days. He had been boarding in the ED awaiting long-term placement when he developed intractable diarrhea with incontinence. The dehydration that resulted contributed to an AKI.   In the ED, it was found that they had BP of 133/84 with temperature of 98, heart rate of 83 and respiratory rate of 20 with pulse oximetry of 96% on room air.  Significant findings included:  BUN of 41 and creatinine 1.52 compared to 22/1.11 on 1/10 and blood glucose of 237.  CBC showed hemoglobin 12.3 and hematocrit 37.8 with WBC of 5.5 and platelets 249.  Previous H&H were close. EKG On 1/10 showed normal sinus rhythm with a rate of 81 with left axis deviation and prolonged QT interval with QTc of 480 MS. Imaging: Most recent two-view chest x-ray on 1/10 showed linear scarring or atelectasis at the right lung base.  Abdominal pelvic CT scan this morning revealed bilateral hyperdense renal lesions that may be hemorrhagic/proteinaceous cysts with no acute abnormality in the abdomen or pelvis.  It showed cholelithiasis and body wall edema.  There was a recommendation for further follow-up with nonemergent MRI of the abdomen with and without contrast to rule out malignancy.  They were initially treated with 4 mg of IV Zofran and 1 L bolus of IV lactated Ringer and stool pathogen panel was ordered. Patient was not agreeable to sample collection.   Patient was admitted to medicine service for further workup and management of diarrhea as outlined in detail below.  07/09/22  -improved  Assessment & Plan  Principal Problem:   AKI (acute kidney injury) ([place]) Active Problems:   Memory changes   Intractable diarrhea   Uncontrolled type 2 diabetes mellitus with hyperglycemia, with long-term current use of insulin (HCC)   Dyslipidemia   History of DVT (deep vein thrombosis)   Essential hypertension   Diabetic neuropathy (HCC)  AKI- worsening. Cr 1.3>1.5 - This is like prerenal secondary to his recurrent diarrhea could be related to acute enteritis. - Continue hydration with IV normal saline. - BMP am   Intractable diarrhea- improving. Sample for pathogen panel still has not been collected. His abdominal exam is benign and abdominal CT scan showed no acute abdominal or pelvic abnormalities. Last BM was overnight.  - f/u stool sample for pathogen panel - Will be placed on as needed Imodium.  Dementia- patient is oriented to self only and poorly redirected. Son at bedside states he is at his baseline. Will need placement - TOC engaged for placement - 1:1 sitter - delirium precautions - seroquel  - PRN haldol, ativan  Bed bugs- found after admission. Room/fabrics cleaned/replaced   Uncontrolled type 2 diabetes mellitus with hyperglycemia, with long-term current use of insulin (HCC) - sSSI - Semglee - continue his Amaryl, jardriance    Dyslipidemia - continue statin therapy.   Essential hypertension- well controlled -  hold off lisinopril given his acute kidney injury.   History of DVT (deep vein thrombosis) - continue Eliquis    Diabetic neuropathy (HCC) - continue  Neurontin.  Body mass index is 21.96 kg/m.  VTE ppx:  apixaban (ELIQUIS) tablet 5 mg   Diet:     Diet   Diet regular Room service appropriate? Yes; Fluid consistency: Thin   Consultants: None   Subjective 07/09/22    Pt reports no concerns. He's not otherwise able to hold a conversation.    Objective   Vitals:   07/08/22 1250 07/08/22 1312 07/08/22 1841 07/09/22 0843   BP: 99/75 120/76  109/61  Pulse: 74 72  89  Resp: 18 15  16   Temp: 98.4 F (36.9 C) 98 F (36.7 C)  (!) 97.2 F (36.2 C)  TempSrc: Oral Oral    SpO2: 97% 100%  97%  Weight:   84 kg   Height:  6\' 5"  (1.956 m) 6\' 5"  (1.956 m)     Intake/Output Summary (Last 24 hours) at 07/09/2022 1350 Last data filed at 07/09/2022 1300 Gross per 24 hour  Intake 586.99 ml  Output 700 ml  Net -113.01 ml   Filed Weights   06/17/22 1549 07/08/22 1841  Weight: 86 kg 84 kg     Physical Exam:  General: awake, alert, NAD HEENT: atraumatic, clear conjunctiva, anicteric sclera, MMM, hearing grossly normal Respiratory: normal respiratory effort. Cardiovascular: quick capillary refill Gastrointestinal: soft, NT, ND Nervous: A&O to self. no gross focal neurologic deficits Extremities: moves all equally, no edema, normal tone Skin: dry, intact, normal temperature, normal color. No rashes, lesions or ulcers on exposed skin  Labs   I have personally reviewed the following labs and imaging studies CBC    Component Value Date/Time   WBC 5.4 07/08/2022 0549   RBC 3.44 (L) 07/08/2022 0549   HGB 12.1 (L) 07/08/2022 1848   HGB 12.2 (L) 05/16/2013 1035   HCT 36.4 (L) 07/08/2022 1848   HCT 35.6 (L) 05/16/2013 1035   PLT 228 07/08/2022 0549   PLT 196 05/16/2013 1035   MCV 102.6 (H) 07/08/2022 0549   MCV 99 05/16/2013 1035   MCH 33.4 07/08/2022 0549   MCHC 32.6 07/08/2022 0549   RDW 12.9 07/08/2022 0549   RDW 13.0 05/16/2013 1035   LYMPHSABS 1.6 07/08/2022 0222   MONOABS 0.7 07/08/2022 0222   EOSABS 0.2 07/08/2022 0222   BASOSABS 0.0 07/08/2022 0222      Latest Ref Rng & Units 07/09/2022    8:37 AM 07/08/2022    5:49 AM 07/08/2022    2:22 AM  BMP  Glucose 70 - 99 mg/dL 156  208  237   BUN 8 - 23 mg/dL 33  38  41   Creatinine 0.61 - 1.24 mg/dL 1.50  1.31  1.52   Sodium 135 - 145 mmol/L 140  135  136   Potassium 3.5 - 5.1 mmol/L 4.6  4.9  4.9   Chloride 98 - 111 mmol/L 110  105  102   CO2 22 - 32  mmol/L 22  23  26    Calcium 8.9 - 10.3 mg/dL 9.1  8.7  9.4     CT ABDOMEN PELVIS W CONTRAST  Result Date: 07/08/2022 CLINICAL DATA:  Recurrent diarrhea and right-sided abdominal pain EXAM: CT ABDOMEN AND PELVIS WITH CONTRAST TECHNIQUE: Multidetector CT imaging of the abdomen and pelvis was performed using the standard protocol following bolus administration of intravenous contrast. RADIATION DOSE REDUCTION: This exam was performed according to the departmental dose-optimization program which includes automated exposure control, adjustment of the mA and/or kV according to patient size and/or use of iterative reconstruction  technique. CONTRAST:  160mL OMNIPAQUE IOHEXOL 300 MG/ML  SOLN COMPARISON:  None Available. FINDINGS: Lower chest: Advanced coronary artery calcification. Emphysema. Right basilar atelectasis/scarring. Hepatobiliary: Cholelithiasis. No biliary dilation. No solid hepatic lesion. Pancreas: Unremarkable. Spleen: Unremarkable. Adrenals/Urinary Tract: Normal adrenal glands. Multiple low-attenuation lesions in the kidneys bilaterally are compatible with benign cysts. Hyperdense lesion extending off the posterior right kidney measuring 3.2 cm and hyperdense lesion off the inferior pole of the left kidney measuring 2.6 cm may represent hemorrhagic/proteinaceous cyst however are indeterminate. No urinary calculi or hydronephrosis. Unremarkable bladder. Stomach/Bowel: Stomach is within normal limits. Appendix appears normal. Normal caliber large and small bowel. No wall thickening, inflammatory change, or sign of ischemia. Vascular/Lymphatic: Aortic atherosclerosis. No enlarged abdominal or pelvic lymph nodes. Reproductive: Enlarged prostate. Other: No free intraperitoneal fluid or air. Asymmetric focal fat deposition within the right retroperitoneum with minimal if any stranding. Musculoskeletal: Body wall edema.  No acute osseous abnormality. IMPRESSION: No acute abnormality in the abdomen or pelvis.  Bilateral hyperdense renal lesions may be hemorrhagic/proteinaceous cysts however further evaluation with nonemergent MRI with and without contrast is recommended to exclude malignancy. Cholelithiasis. Body wall edema. Electronically Signed   By: Placido Sou M.D.   On: 07/08/2022 03:27    Disposition Plan & Communication  Patient status: Observation  Admitted From: Home Planned disposition location: TBD Anticipated discharge date: 2/4 pending improvement in diarrhea and placement  Family Communication: son at bedside    Author: Richarda Osmond, DO [facility] 07/09/2022, 1:50 PM   Available by Epic secure chat 7AM-7PM. If 7PM-7AM, please contact night-coverage.  [facility] contact information found on [email or web address].

## 2022-07-08 NOTE — Assessment & Plan Note (Signed)
-  We will hold off lisinopril given his acute kidney injury. - He will be placed on as needed IV labetalol.

## 2022-07-08 NOTE — TOC Progression Note (Signed)
Transition of Care [facility]) - Progression Note    Patient Details  Name: AMADU SCHLAGETER MRN: [id number] Date of Birth: 1945/12/18  Transition of Care [facility]) CM/SW Contact  Beverly Sessions, RN Phone Number: 07/08/2022, 3:33 PM  Clinical Narrative:     Secure emailed to Addie with DSS "Yidel Teuscher has moved to my unit.  Looks like the ED Case manager has found a facility that will accept him if the SA application has been started.  It's my understanding that son filled out the long term application in error, but that Crystal was going to check to see if it could be transferred over to the [place] application.  Are you able to give me any insight where we are in the process or what the next steps need to be?"  Included [facility] supervisor   Expected Discharge Plan: Assisted Living Barriers to Discharge: ED Unsafe disposition  Expected Discharge Plan and Services   Discharge Planning Services: CM Consult   Living arrangements for the past 2 months: Single Family Home                 DME Arranged: N/A DME Agency: NA                   Social Determinants of Health (SDOH) Interventions SDOH Screenings   Tobacco Use: High Risk (07/08/2022)    Readmission Risk Interventions     No data to display

## 2022-07-08 NOTE — ED Notes (Signed)
Pt requesting to leave. Redirectable with coffee and graham crackers. Pt is getting frustrated with this RN when asked about BG and blood draw.

## 2022-07-08 NOTE — Assessment & Plan Note (Signed)
-  We will continue statin therapy. 

## 2022-07-08 NOTE — ED Notes (Signed)
Messaged MD about pt's agitation and lack of cooperation

## 2022-07-08 NOTE — ED Notes (Signed)
Pt refusing to let this RN draw blood or check BG at this time.

## 2022-07-08 NOTE — ED Notes (Signed)
Son at bedside.
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
## 2022-07-08 NOTE — ED Notes (Signed)
RN aware bed assigned ?

## 2022-07-08 NOTE — Care Management Obs Status (Signed)
[place] NOTIFICATION   Patient Details  Name: Mitchell Price MRN: [id number] Date of Birth: 07-06-1945   Medicare Observation Status Notification Given:  Yes    Shelbie Hutching, RN 07/08/2022, 11:55 AM

## 2022-07-08 NOTE — ED Notes (Signed)
Attempted to collect stool sample and change patient's pants. Patient refused. Patient took hat from toilet and put hat on floor. Patient stated, "Damn girl! Get out of here!" This RN unable to assist patient in changing from dirty scrub pants.

## 2022-07-08 NOTE — Assessment & Plan Note (Addendum)
-  This likely the culprit for #1. - We will follow stool pathogens. - Will be placed on as needed Imodium. - His abdominal exam is fairly benign and abdominal CT scan showed no acute abdominal or pelvic abnormalities. - Blood spots seen with his diarrhea could be related to straining, tenesmus and hemorrhoids.  Will closely monitor H&H.

## 2022-07-08 NOTE — Assessment & Plan Note (Signed)
-  We will continue Neurontin. ?

## 2022-07-08 NOTE — Assessment & Plan Note (Addendum)
-  This is apparently chronic for which she was placed on Plavix.  His last GI bleed was 2 years ago.  His H&H is currently stable. - We will continue Eliquis for now while monitoring H&H. - We will also obtain PT evaluation to assess his ambulation while on Eliquis especially given his recent history of recurrent falls.

## 2022-07-08 NOTE — Progress Notes (Signed)
Patient arrived to floor from ED in scrubs and had soiled himself. Oriented patient to unit and assisted him to bathe. Ambulates independently.  No family at bedside.  Due to him stating he would not stay and wanted to leave. Rooms changed to closer to nursing station. Within the hour patient became agitated and angry wanting to leave.  Stating he will walk home.  Walked out of the room and toward stairs.  Door blocked and patient became very angry.  Security called.  Paient walked off unit, unable to be redirected.  Security escorted patient to his room and stayed in order to keep patient from leaving.  MD and son called.  Both coming to assist.

## 2022-07-08 NOTE — Inpatient Diabetes Management (Signed)
Inpatient Diabetes Program Recommendations  AACE/ADA: New Consensus Statement on Inpatient Glycemic Control (2015)  Target Ranges:  Prepandial:   less than 140 mg/dL      Peak postprandial:   less than 180 mg/dL (1-2 hours)      Critically ill patients:  140 - 180 mg/dL    Latest Reference Range & Units 07/07/22 07:16 07/07/22 11:58 07/07/22 16:13 07/07/22 21:59 07/08/22 07:29  Glucose-Capillary 70 - 99 mg/dL 196 (H) 191 (H) 214 (H) 252 (H) 216 (H)   Review of Glycemic Control  Diabetes history: DM2 Outpatient Diabetes medications: Metformin 1000 mg BID, Actos 30 mg daily, Jardiance 25 mg daily, Amaryl 4 mg daily Current orders for Inpatient glycemic control: Semglee 5 units daily, Novolog 0-15 units TID with meals, Novolog 0-5 units QHS, Novolog 6 units TID with meals, Metformin 1000 mg BID, Actos 30 mg daily, Jardiance 25 mg daily, Amaryl 4 mg daily  Inpatient Diabetes Program Recommendations:    Insulin: Please consider increasing Semglee to 7 units daily and meal coverage to Novolog 8 units TID with meals.  Thanks, Barnie Alderman, RN, MSN, [place] Diabetes Coordinator Inpatient Diabetes Program [phone] (Team Pager from 8am to [place])

## 2022-07-08 NOTE — Assessment & Plan Note (Signed)
-  The patient will be laced on supplement coverage with NovoLog. - We will continue his basal coverage with Semglee - We will continue his Amaryl. - We will hold other oral antidiabetics including metformin given acute kidney injury.

## 2022-07-08 NOTE — ED Provider Notes (Signed)
Boarding patient with half the past 3 weeks or so has had an acute change tonight of the past few hours.  He has been running back and forth to the bathroom and has had recurrent watery diarrhea, about 10 episodes in the past 5 hours since my shift started.  Reporting some cramping abdominal discomfort and has some mild tenderness on the right.  No fever or significant vital sign changes.  We redraw some blood work that does seem to demonstrate an AKI that I suspect is prerenal from his fluid losses.  CT without SBO or concerning features.  No antibiotics to make me think C. difficile while he has been boarding.  I consult with medicine and they agree to admit.   Vladimir Crofts, MD 07/08/22 0400

## 2022-07-09 DIAGNOSIS — R739 Hyperglycemia, unspecified: Secondary | ICD-10-CM | POA: Diagnosis not present

## 2022-07-09 DIAGNOSIS — A09 Infectious gastroenteritis and colitis, unspecified: Secondary | ICD-10-CM

## 2022-07-09 DIAGNOSIS — W19XXXA Unspecified fall, initial encounter: Secondary | ICD-10-CM

## 2022-07-09 DIAGNOSIS — N179 Acute kidney failure, unspecified: Secondary | ICD-10-CM | POA: Diagnosis not present

## 2022-07-09 DIAGNOSIS — R4182 Altered mental status, unspecified: Secondary | ICD-10-CM

## 2022-07-09 LAB — BASIC METABOLIC PANEL
Anion gap: 8 (ref 5–15)
BUN: 33 mg/dL — ABNORMAL HIGH (ref 8–23)
CO2: 22 mmol/L (ref 22–32)
Calcium: 9.1 mg/dL (ref 8.9–10.3)
Chloride: 110 mmol/L (ref 98–111)
Creatinine, Ser: 1.5 mg/dL — ABNORMAL HIGH (ref 0.61–1.24)
GFR, Estimated: 47 mL/min — ABNORMAL LOW (ref >60)
Glucose, Bld: 156 mg/dL — ABNORMAL HIGH (ref 70–99)
Potassium: 4.6 mmol/L (ref 3.5–5.1)
Sodium: 140 mmol/L (ref 135–145)

## 2022-07-09 LAB — GLUCOSE, CAPILLARY
Glucose-Capillary: 171 mg/dL — ABNORMAL HIGH (ref 70–99)
Glucose-Capillary: 188 mg/dL — ABNORMAL HIGH (ref 70–99)
Glucose-Capillary: 82 mg/dL (ref 70–99)
Glucose-Capillary: 86 mg/dL (ref 70–99)

## 2022-07-09 MED ORDER — WITCH HAZEL-GLYCERIN EX PADS: MEDICATED_PAD | CUTANEOUS | Status: DC | PRN

## 2022-07-09 MED ORDER — INSULIN GLARGINE-YFGN 100 UNIT/ML ~~LOC~~ SOLN
25.0000 [IU] | Freq: Every day | SUBCUTANEOUS | Status: DC
  Filled 2022-07-09: qty 0.25

## 2022-07-09 NOTE — TOC Progression Note (Signed)
Transition of Care [facility]) - Progression Note    Patient Details  Name: Mitchell Price MRN: [id number] Date of Birth: Oct 25, 1945  [facility] CM/SW Contact  Beverly Sessions, RN Phone Number: 07/09/2022, 10:50 AM  Clinical Narrative:     Per [place] Department of Social Services Phone [phone]  "I can give you limited information without an authorized representative form.  I do have an application for LTC.  If SA is requested a new app for SA will need to be completed IN PERSON at the county office.    We prefer appointments be made since the application can be one hour long."   Message left for son [name] requesting return call to provide information above      Expected Discharge Plan: Assisted Living Barriers to Discharge: ED Unsafe disposition  Expected Discharge Plan and Services   Discharge Planning Services: CM Consult   Living arrangements for the past 2 months: Single Family Home                 DME Arranged: N/A DME Agency: NA                   Social Determinants of Health (SDOH) Interventions SDOH Screenings   Tobacco Use: High Risk (07/08/2022)    Readmission Risk Interventions     No data to display

## 2022-07-09 NOTE — Progress Notes (Signed)
Mobility Specialist - Progress Note   07/09/22 1000  Mobility  Activity Ambulated with assistance in room;Transferred from bed to chair  Level of Assistance Maximum assist, patient does 25-49%  Assistive Device Front wheel walker  Distance Ambulated (ft) 15 ft  Activity Response Tolerated fair  $Mobility charge 1 Mobility     Pt sitting EOB on arrival, utilizing RA. Pt lethargic but agreeable to activity. L lateral lean in both sitting/standing this date. MaxA to stand from elevated bed x2. VC for hand placement and maintaining RW on ground. Pt ambulated 15' in room with LOB x2 requiring modA to recover. Pt noted to have been ambulating hallway independently without AD yesterday. Follows commands fairly. Pt transferred bed-chair where he was left with sitter present. RN notified.    Kathee Delton Mobility Specialist 07/09/22, 10:51 AM

## 2022-07-09 NOTE — TOC Progression Note (Addendum)
Transition of Care [facility]) - Progression Note    Patient Details  Name: Mitchell Price MRN: [id number] Date of Birth: 1945-10-30  [facility]) CM/SW Contact  Beverly Sessions, RN Phone Number: 07/09/2022, 1:10 PM  Clinical Narrative:     Received return call from son richard.  He will call DSS today to set up an appointment to complete the SA application   983 pm -  son has appointment at dss on 2/6 at 16 am  Expected Discharge Plan: Assisted Living Barriers to Discharge: ED Unsafe disposition  Expected Discharge Plan and Services   Discharge Planning Services: CM Consult   Living arrangements for the past 2 months: Single Family Home                 DME Arranged: N/A DME Agency: NA                   Social Determinants of Health (SDOH) Interventions SDOH Screenings   Tobacco Use: High Risk (07/08/2022)    Readmission Risk Interventions     No data to display

## 2022-07-10 DIAGNOSIS — R630 Anorexia: Secondary | ICD-10-CM | POA: Diagnosis present

## 2022-07-10 DIAGNOSIS — E538 Deficiency of other specified B group vitamins: Secondary | ICD-10-CM | POA: Diagnosis present

## 2022-07-10 DIAGNOSIS — E1165 Type 2 diabetes mellitus with hyperglycemia: Secondary | ICD-10-CM | POA: Diagnosis not present

## 2022-07-10 DIAGNOSIS — F0390 Unspecified dementia without behavioral disturbance: Secondary | ICD-10-CM | POA: Diagnosis not present

## 2022-07-10 DIAGNOSIS — N401 Enlarged prostate with lower urinary tract symptoms: Secondary | ICD-10-CM | POA: Diagnosis not present

## 2022-07-10 DIAGNOSIS — I10 Essential (primary) hypertension: Secondary | ICD-10-CM | POA: Diagnosis present

## 2022-07-10 DIAGNOSIS — A09 Infectious gastroenteritis and colitis, unspecified: Secondary | ICD-10-CM | POA: Diagnosis present

## 2022-07-10 DIAGNOSIS — Z794 Long term (current) use of insulin: Secondary | ICD-10-CM | POA: Diagnosis not present

## 2022-07-10 DIAGNOSIS — G4700 Insomnia, unspecified: Secondary | ICD-10-CM | POA: Diagnosis present

## 2022-07-10 DIAGNOSIS — Z6822 Body mass index (BMI) 22.0-22.9, adult: Secondary | ICD-10-CM | POA: Diagnosis not present

## 2022-07-10 DIAGNOSIS — F17210 Nicotine dependence, cigarettes, uncomplicated: Secondary | ICD-10-CM | POA: Diagnosis present

## 2022-07-10 DIAGNOSIS — N179 Acute kidney failure, unspecified: Secondary | ICD-10-CM | POA: Diagnosis present

## 2022-07-10 DIAGNOSIS — E1140 Type 2 diabetes mellitus with diabetic neuropathy, unspecified: Secondary | ICD-10-CM | POA: Diagnosis present

## 2022-07-10 DIAGNOSIS — F03918 Unspecified dementia, unspecified severity, with other behavioral disturbance: Secondary | ICD-10-CM | POA: Diagnosis present

## 2022-07-10 DIAGNOSIS — R197 Diarrhea, unspecified: Secondary | ICD-10-CM | POA: Diagnosis present

## 2022-07-10 DIAGNOSIS — Z7901 Long term (current) use of anticoagulants: Secondary | ICD-10-CM | POA: Diagnosis not present

## 2022-07-10 DIAGNOSIS — R339 Retention of urine, unspecified: Secondary | ICD-10-CM | POA: Diagnosis not present

## 2022-07-10 DIAGNOSIS — K649 Unspecified hemorrhoids: Secondary | ICD-10-CM | POA: Diagnosis present

## 2022-07-10 DIAGNOSIS — Z86718 Personal history of other venous thrombosis and embolism: Secondary | ICD-10-CM | POA: Diagnosis not present

## 2022-07-10 DIAGNOSIS — Z66 Do not resuscitate: Secondary | ICD-10-CM | POA: Diagnosis present

## 2022-07-10 DIAGNOSIS — E785 Hyperlipidemia, unspecified: Secondary | ICD-10-CM | POA: Diagnosis present

## 2022-07-10 DIAGNOSIS — R9431 Abnormal electrocardiogram [ECG] [EKG]: Secondary | ICD-10-CM | POA: Diagnosis present

## 2022-07-10 DIAGNOSIS — Z751 Person awaiting admission to adequate facility elsewhere: Secondary | ICD-10-CM | POA: Diagnosis not present

## 2022-07-10 DIAGNOSIS — E860 Dehydration: Secondary | ICD-10-CM | POA: Diagnosis present

## 2022-07-10 DIAGNOSIS — R338 Other retention of urine: Secondary | ICD-10-CM | POA: Diagnosis not present

## 2022-07-10 DIAGNOSIS — E11649 Type 2 diabetes mellitus with hypoglycemia without coma: Secondary | ICD-10-CM | POA: Diagnosis present

## 2022-07-10 DIAGNOSIS — N1831 Chronic kidney disease, stage 3a: Secondary | ICD-10-CM | POA: Diagnosis not present

## 2022-07-10 DIAGNOSIS — R296 Repeated falls: Secondary | ICD-10-CM | POA: Diagnosis present

## 2022-07-10 DIAGNOSIS — Z1152 Encounter for screening for COVID-19: Secondary | ICD-10-CM | POA: Diagnosis not present

## 2022-07-10 LAB — CBC
HCT: 37.2 % — ABNORMAL LOW (ref 39.0–52.0)
Hemoglobin: 12.2 g/dL — ABNORMAL LOW (ref 13.0–17.0)
MCH: 32.8 pg (ref 26.0–34.0)
MCHC: 32.8 g/dL (ref 30.0–36.0)
MCV: 100 fL (ref 80.0–100.0)
Platelets: 225 10*3/uL (ref 150–400)
RBC: 3.72 MIL/uL — ABNORMAL LOW (ref 4.22–5.81)
RDW: 12.8 % (ref 11.5–15.5)
WBC: 5.6 10*3/uL (ref 4.0–10.5)
nRBC: 0 % (ref 0.0–0.2)

## 2022-07-10 LAB — BASIC METABOLIC PANEL
Anion gap: 9 (ref 5–15)
BUN: 23 mg/dL (ref 8–23)
CO2: 23 mmol/L (ref 22–32)
Calcium: 9.1 mg/dL (ref 8.9–10.3)
Chloride: 109 mmol/L (ref 98–111)
Creatinine, Ser: 1.14 mg/dL (ref 0.61–1.24)
GFR, Estimated: >60 mL/min (ref >60)
Glucose, Bld: 68 mg/dL — ABNORMAL LOW (ref 70–99)
Potassium: 4.1 mmol/L (ref 3.5–5.1)
Sodium: 141 mmol/L (ref 135–145)

## 2022-07-10 LAB — GLUCOSE, CAPILLARY
Glucose-Capillary: 88 mg/dL (ref 70–99)
Glucose-Capillary: 287 mg/dL — ABNORMAL HIGH (ref 70–99)
Glucose-Capillary: 227 mg/dL — ABNORMAL HIGH (ref 70–99)
Glucose-Capillary: 373 mg/dL — ABNORMAL HIGH (ref 70–99)

## 2022-07-10 LAB — PHOSPHORUS: Phosphorus: 4.9 mg/dL — ABNORMAL HIGH (ref 2.5–4.6)

## 2022-07-10 LAB — MAGNESIUM: Magnesium: 2 mg/dL (ref 1.7–2.4)

## 2022-07-10 MED ORDER — HYDRALAZINE HCL 20 MG/ML IJ SOLN: 10.0000 mg | Freq: Four times a day (QID) | INTRAMUSCULAR | Status: DC | PRN

## 2022-07-10 MED ORDER — DEXTROSE 50 % IV SOLN: 12.5000 g | INTRAVENOUS | Status: AC

## 2022-07-10 MED ORDER — INSULIN ASPART 100 UNIT/ML IJ SOLN: 0.0000 [IU] | Freq: Three times a day (TID) | INTRAMUSCULAR | Status: DC

## 2022-07-10 MED ORDER — SACCHAROMYCES BOULARDII 250 MG PO CAPS
250.0000 mg | ORAL_CAPSULE | Freq: Two times a day (BID) | ORAL | Status: AC
  Administered 2022-07-10 – 2022-07-14 (×9): 250 mg via ORAL
  Filled 2022-07-10 (×10): qty 1

## 2022-07-10 MED ORDER — HYDRALAZINE HCL 50 MG PO TABS: 50.0000 mg | ORAL_TABLET | Freq: Four times a day (QID) | ORAL | Status: DC | PRN

## 2022-07-10 MED ORDER — METOPROLOL TARTRATE 25 MG PO TABS
25.0000 mg | ORAL_TABLET | Freq: Two times a day (BID) | ORAL | Status: DC
  Administered 2022-07-10 – 2022-07-23 (×26): 25 mg via ORAL
  Filled 2022-07-10 (×25): qty 1

## 2022-07-10 MED ORDER — INSULIN ASPART 100 UNIT/ML IJ SOLN
0.0000 [IU] | Freq: Three times a day (TID) | INTRAMUSCULAR | Status: DC
  Administered 2022-07-10: 15 [IU] via SUBCUTANEOUS
  Administered 2022-07-11: 2 [IU] via SUBCUTANEOUS
  Administered 2022-07-11: 15 [IU] via SUBCUTANEOUS
  Administered 2022-07-11: 8 [IU] via SUBCUTANEOUS
  Administered 2022-07-11: 3 [IU] via SUBCUTANEOUS
  Administered 2022-07-12: 8 [IU] via SUBCUTANEOUS
  Administered 2022-07-12: 3 [IU] via SUBCUTANEOUS
  Administered 2022-07-12: 15 [IU] via SUBCUTANEOUS
  Administered 2022-07-13: 3 [IU] via SUBCUTANEOUS
  Administered 2022-07-13: 15 [IU] via SUBCUTANEOUS
  Administered 2022-07-13: 11 [IU] via SUBCUTANEOUS
  Administered 2022-07-13: 3 [IU] via SUBCUTANEOUS
  Administered 2022-07-14: 5 [IU] via SUBCUTANEOUS
  Administered 2022-07-14: 8 [IU] via SUBCUTANEOUS
  Administered 2022-07-14: 11 [IU] via SUBCUTANEOUS
  Administered 2022-07-14: 5 [IU] via SUBCUTANEOUS
  Administered 2022-07-15 (×2): 3 [IU] via SUBCUTANEOUS
  Administered 2022-07-15: 5 [IU] via SUBCUTANEOUS
  Administered 2022-07-15 – 2022-07-16 (×2): 8 [IU] via SUBCUTANEOUS
  Administered 2022-07-16: 11 [IU] via SUBCUTANEOUS
  Administered 2022-07-17: 8 [IU] via SUBCUTANEOUS
  Administered 2022-07-17: 5 [IU] via SUBCUTANEOUS
  Filled 2022-07-10 (×23): qty 1

## 2022-07-10 MED ORDER — GLUCERNA SHAKE PO LIQD
237.0000 mL | Freq: Three times a day (TID) | ORAL | Status: DC
  Administered 2022-07-10 – 2022-07-22 (×29): 237 mL via ORAL

## 2022-07-10 NOTE — Progress Notes (Signed)
Mobility Specialist - Progress Note   07/10/22 1100  Mobility  Activity Ambulated with assistance in room  Level of Assistance Standby assist, set-up cues, supervision of patient - no hands on  Assistive Device None  Distance Ambulated (ft) 25 ft  Activity Response Tolerated well  $Mobility charge 1 Mobility     Pt lying in bed upon arrival, utilizing RA. Pt more alert today. Completed bed mobility modI; STS from standard bed height and ambulation with supervision. No AD. No LOB. Performance much improved this date compared to previous session. Pt returned to bed with minA; VC for sequencing. Sitter present.    Kathee Delton Mobility Specialist 07/10/22, 11:12 AM

## 2022-07-10 NOTE — Progress Notes (Addendum)
[facility] Progress Note  Patient: Mitchell Price    LKG:[id number]  DOA: 06/17/2022     Date of Service: the patient was seen and examined on 07/10/2022  Chief Complaint  Patient presents with   Fall   Brief hospital course: Mitchell Price is a 77 y.o. male with a PMH significant for type  2 diabetes mellitus, essential hypertension, history of DVT on Eliquis, history of PUD, diverticulosis, type diabetes mellitus and dyslipidemia.   They presented from ED to the ED on 06/17/2022 with intractable diarrhea x 1 days. He had been boarding in the ED awaiting long-term placement when he developed intractable diarrhea with incontinence. The dehydration that resulted contributed to an AKI.    In the ED, it was found that they had BP of 133/84 with temperature of 98, heart rate of 83 and respiratory rate of 20 with pulse oximetry of 96% on room air.  Significant findings included:  BUN of 41 and creatinine 1.52 compared to 22/1.11 on 1/10 and blood glucose of 237.  CBC showed hemoglobin 12.3 and hematocrit 37.8 with WBC of 5.5 and platelets 249.  Previous H&H were close. EKG On 1/10 showed normal sinus rhythm with a rate of 81 with left axis deviation and prolonged QT interval with QTc of 480 MS. Imaging: Most recent two-view chest x-ray on 1/10 showed linear scarring or atelectasis at the right lung base.  Abdominal pelvic CT scan this morning revealed bilateral hyperdense renal lesions that may be hemorrhagic/proteinaceous cysts with no acute abnormality in the abdomen or pelvis.  It showed cholelithiasis and body wall edema.  There was a recommendation for further follow-up with nonemergent MRI of the abdomen with and without contrast to rule out malignancy.   They were initially treated with 4 mg of IV Zofran and 1 L bolus of IV lactated Ringer and stool pathogen panel was ordered. Patient was not agreeable to sample collection.    Patient was admitted to medicine service for further  workup and management of diarrhea as outlined in detail below.   Assessment and Plan: Principal Problem:   AKI (acute kidney injury) ([place]) Active Problems:   Memory changes   Intractable diarrhea   Uncontrolled type 2 diabetes mellitus with hyperglycemia, with long-term current use of insulin (HCC)   Dyslipidemia   History of DVT (deep vein thrombosis)   Essential hypertension   Diabetic neuropathy (HCC)   # AKI, prerenal due to dehydration secondary to diarrhea Cr 1.52--1.3--1.14 gradually improving - Continue hydration with IV normal saline. - repeat BMP am   # Intractable diarrhea- improving.  His abdominal exam is benign and abdominal CT scan showed no acute abdominal or pelvic abnormalities. - f/u stool sample for pathogen panel -Diarrhea is improving, we will continue to monitor hydration status. Started Florastor  # Dementia- patient is oriented to self only and poorly redirected. His son stated that he is at his baseline. Will need placement - TOC engaged for placement - 1:1 sitter - delirium precautions - seroquel  - PRN haldol, ativan   # Bed bugs- found after admission. Room/fabrics cleaned/replaced   # Uncontrolled type 2 diabetes mellitus with hyperglycemia, with long-term current use of insulin ([place]) On 2/2 patient had an episode of hypoglycemia, discontinued Semglee and discontinued home oral antidiabetic medications.   Follow hypoglycemia protocol Continue NovoLog sliding scale, continue diabetic diet. Monitor FSBG.    Dyslipidemia - continue statin therapy.   Essential hypertension-  -  hold off lisinopril given his  acute kidney injury. 2/2 started Lopressor with holding parameters. Use hydralazine po or IV as per BP parameters  History of DVT (deep vein thrombosis) - continue Eliquis    Diabetic neuropathy (HCC) - continue Neurontin.   Body mass index is 21.96 kg/m.  Interventions:   On admission patient had severe dehydration and low  appetite, developed AKI due to significant diarrhea.   On 2/1 patient continued to have diarrhea, poor appetite and patient required IV fluid.   Today on 2/2 diarrhea resolved, AKI resolved, appetite is improving.  Decreased IV fluid NS 50 mL/h for gentle hydration, patient has significant dementia and may not drink enough water to keep himself hydrated.   Diet: Carb modified diet DVT Prophylaxis: Therapeutic Anticoagulation with Eliquis    Advance goals of care discussion: Full code  Family Communication: family was not present at bedside, at the time of interview.  The pt provided permission to discuss medical plan with the family. Opportunity was given to ask question and all questions were answered satisfactorily.  Patient has significant dementia AAO x 1  Disposition:  Pt is from Home, admitted with worsening of dementia, waiting in the ED for placement, developed diarrhea, dehydration and AKI so admitted as an inpatient for IV hydration.  AKI resolved, appetite improved, diarrhea is improving.  Patient is clinically improving to discharge, awaiting placement. Discharge to LTC placement, when bed available..  Subjective: No significant events overnight, patient is AAO x 1, significant dementia, unable to offer any complaints, no chest pain or palpitation, no shortness of breath.  No abdominal pain, no nausea vomiting.  Patient says that he is trying to eat as much as he can.    Physical Exam: General: NAD, lying comfortably Appear in no distress, affect appropriate Eyes: PERRLA ENT: Oral Mucosa Clear, moist  Neck: no JVD,  Cardiovascular: S1 and S2 Present, no Murmur,  Respiratory: good respiratory effort, Bilateral Air entry equal and Decreased, no Crackles, no wheezes Abdomen: Bowel Sound present, Soft and no tenderness,  Skin: no rashes Extremities: no Pedal edema, no calf tenderness Neurologic: without any new focal findings, AAO x 1 Gait not checked due to patient safety  concerns  Vitals:   07/09/22 1541 07/09/22 2047 07/10/22 0348 07/10/22 0833  BP: 125/85 (!) 141/71 122/69 139/83  Pulse: 86 (!) 101 99 98  Resp: 18 18 18 18   Temp: (!) 97.2 F (36.2 C)   99.2 F (37.3 C)  TempSrc:    Oral  SpO2: 97% 97% 97% 98%  Weight:      Height:        Intake/Output Summary (Last 24 hours) at 07/10/2022 1146 Last data filed at 07/10/2022 7829 Gross per 24 hour  Intake 960 ml  Output 2650 ml  Net -1690 ml   Filed Weights   06/17/22 1549 07/08/22 1841  Weight: 86 kg 84 kg    Data Reviewed: I have personally reviewed and interpreted daily labs, tele strips, imagings as discussed above. I reviewed all nursing notes, pharmacy notes, vitals, pertinent old records I have discussed plan of care as described above with RN and patient/family.  CBC: Recent Labs  Lab 07/05/22 0419 07/08/22 0222 07/08/22 0549 07/08/22 1217 07/08/22 1848 07/10/22 0533  WBC 4.7 5.5 5.4  --   --  5.6  NEUTROABS  --  3.0  --   --   --   --   HGB 12.1* 12.3* 11.5* 11.8* 12.1* 12.2*  HCT 36.4* 37.8* 35.3* 36.3* 36.4* 37.2*  MCV 101.7* 102.7* 102.6*  --   --  100.0  PLT 265 249 228  --   --  629   Basic Metabolic Panel: Recent Labs  Lab 07/08/22 0222 07/08/22 0549 07/09/22 0837 07/10/22 0533  NA 136 135 140 141  K 4.9 4.9 4.6 4.1  CL 102 105 110 109  CO2 26 23 22 23   GLUCOSE 237* 208* 156* 68*  BUN 41* 38* 33* 23  CREATININE 1.52* 1.31* 1.50* 1.14  CALCIUM 9.4 8.7* 9.1 9.1  MG  --   --   --  2.0  PHOS  --   --   --  4.9*    Studies: No results found.  Scheduled Meds:  apixaban  5 mg Oral BID   atorvastatin  40 mg Oral Daily   dextrose  12.5 g Intravenous STAT   feeding supplement (GLUCERNA SHAKE)  237 mL Oral TID BM   ferrous sulfate  325 mg Oral Q breakfast   gabapentin  300 mg Oral Daily   insulin aspart  0-15 Units Subcutaneous TID WC   melatonin  2.5 mg Oral QHS   QUEtiapine  25 mg Oral BID   saccharomyces boulardii  250 mg Oral BID   Continuous  Infusions:  sodium chloride Stopped (07/08/22 2136)   PRN Meds: acetaminophen **OR** acetaminophen, haloperidol lactate, loperamide, LORazepam **OR** LORazepam, ondansetron **OR** ondansetron (ZOFRAN) IV, traZODone, witch hazel-glycerin  Time spent: 35 minutes  Author: Val Riles. MD [facility] 07/10/2022 11:46 AM  To reach On-call, see care teams to locate the attending and reach out to them via [email or web address]. If 7PM-7AM, please contact night-coverage If you still have difficulty reaching the attending provider, please page the [facility] (Director on Call) for [facility] on amion for assistance.

## 2022-07-11 DIAGNOSIS — N179 Acute kidney failure, unspecified: Secondary | ICD-10-CM | POA: Diagnosis not present

## 2022-07-11 LAB — BASIC METABOLIC PANEL
Anion gap: 9 (ref 5–15)
BUN: 36 mg/dL — ABNORMAL HIGH (ref 8–23)
CO2: 21 mmol/L — ABNORMAL LOW (ref 22–32)
Calcium: 8.8 mg/dL — ABNORMAL LOW (ref 8.9–10.3)
Chloride: 107 mmol/L (ref 98–111)
Creatinine, Ser: 1.41 mg/dL — ABNORMAL HIGH (ref 0.61–1.24)
GFR, Estimated: 51 mL/min — ABNORMAL LOW (ref >60)
Glucose, Bld: 100 mg/dL — ABNORMAL HIGH (ref 70–99)
Potassium: 4.1 mmol/L (ref 3.5–5.1)
Sodium: 137 mmol/L (ref 135–145)

## 2022-07-11 LAB — PHOSPHORUS: Phosphorus: 5 mg/dL — ABNORMAL HIGH (ref 2.5–4.6)

## 2022-07-11 LAB — CBC
HCT: 35.8 % — ABNORMAL LOW (ref 39.0–52.0)
Hemoglobin: 11.7 g/dL — ABNORMAL LOW (ref 13.0–17.0)
MCH: 33.2 pg (ref 26.0–34.0)
MCHC: 32.7 g/dL (ref 30.0–36.0)
MCV: 101.7 fL — ABNORMAL HIGH (ref 80.0–100.0)
Platelets: 208 10*3/uL (ref 150–400)
RBC: 3.52 MIL/uL — ABNORMAL LOW (ref 4.22–5.81)
RDW: 13 % (ref 11.5–15.5)
WBC: 4.4 10*3/uL (ref 4.0–10.5)
nRBC: 0 % (ref 0.0–0.2)

## 2022-07-11 LAB — GLUCOSE, CAPILLARY
Glucose-Capillary: 140 mg/dL — ABNORMAL HIGH (ref 70–99)
Glucose-Capillary: 359 mg/dL — ABNORMAL HIGH (ref 70–99)
Glucose-Capillary: 257 mg/dL — ABNORMAL HIGH (ref 70–99)
Glucose-Capillary: 171 mg/dL — ABNORMAL HIGH (ref 70–99)

## 2022-07-11 LAB — MAGNESIUM: Magnesium: 2.4 mg/dL (ref 1.7–2.4)

## 2022-07-11 NOTE — Progress Notes (Signed)
Pt refused vitals, blood sugar check and PO medications.

## 2022-07-11 NOTE — Progress Notes (Signed)
[facility] Progress Note  Patient: Mitchell Price    AOZ:[id number]  DOA: 06/17/2022     Date of Service: the patient was seen and examined on 07/11/2022  Chief Complaint  Patient presents with   Fall   Brief hospital course: JAHREE DERMODY is a 77 y.o. male with a PMH significant for type  2 diabetes mellitus, essential hypertension, history of DVT on Eliquis, history of PUD, diverticulosis, type diabetes mellitus and dyslipidemia.   They presented from ED to the ED on 06/17/2022 with intractable diarrhea x 1 days. He had been boarding in the ED awaiting long-term placement when he developed intractable diarrhea with incontinence. The dehydration that resulted contributed to an AKI.    In the ED, it was found that they had BP of 133/84 with temperature of 98, heart rate of 83 and respiratory rate of 20 with pulse oximetry of 96% on room air.  Significant findings included:  BUN of 41 and creatinine 1.52 compared to 22/1.11 on 1/10 and blood glucose of 237.  CBC showed hemoglobin 12.3 and hematocrit 37.8 with WBC of 5.5 and platelets 249.  Previous H&H were close. EKG On 1/10 showed normal sinus rhythm with a rate of 81 with left axis deviation and prolonged QT interval with QTc of 480 MS. Imaging: Most recent two-view chest x-ray on 1/10 showed linear scarring or atelectasis at the right lung base.  Abdominal pelvic CT scan this morning revealed bilateral hyperdense renal lesions that may be hemorrhagic/proteinaceous cysts with no acute abnormality in the abdomen or pelvis.  It showed cholelithiasis and body wall edema.  There was a recommendation for further follow-up with nonemergent MRI of the abdomen with and without contrast to rule out malignancy.   They were initially treated with 4 mg of IV Zofran and 1 L bolus of IV lactated Ringer and stool pathogen panel was ordered. Patient was not agreeable to sample collection.    Patient was admitted to medicine service for further  workup and management of diarrhea as outlined in detail below.   Assessment and Plan: Principal Problem:   AKI (acute kidney injury) ([place]) Active Problems:   Memory changes   Intractable diarrhea   Uncontrolled type 2 diabetes mellitus with hyperglycemia, with long-term current use of insulin (HCC)   Dyslipidemia   History of DVT (deep vein thrombosis)   Essential hypertension   Diabetic neuropathy (HCC)   # AKI, prerenal due to dehydration secondary to diarrhea Cr 1.52--1.3--1.14--1.4 elevated again today 2/3 increased IVF NS 75 ml/hr RN was advised to encourage for oral fluid intake and check bladder scan - repeat BMP am   # Intractable diarrhea- improving.  His abdominal exam is benign and abdominal CT scan showed no acute abdominal or pelvic abnormalities. - f/u stool sample for pathogen panel -Diarrhea is improving, we will continue to monitor hydration status. Started Florastor  # Dementia- patient is oriented to self only and poorly redirected. His son stated that he is at his baseline. Will need placement - TOC engaged for placement - 1:1 sitter - delirium precautions - seroquel  - PRN haldol, ativan   # Bed bugs- found after admission. Room/fabrics cleaned/replaced   # Uncontrolled type 2 diabetes mellitus with hyperglycemia, with long-term current use of insulin ([place]) On 2/2 patient had an episode of hypoglycemia, discontinued Semglee and discontinued home oral antidiabetic medications.   Follow hypoglycemia protocol Continue NovoLog sliding scale, continue diabetic diet. Monitor FSBG.    Dyslipidemia - continue statin  therapy.   Essential hypertension-  -  hold off lisinopril given his acute kidney injury. 2/2 started Lopressor with holding parameters. Use hydralazine po or IV as per BP parameters  History of DVT (deep vein thrombosis) - continue Eliquis    Diabetic neuropathy - continue Neurontin.   Body mass index is 21.96 kg/m.   Interventions:   On admission patient had severe dehydration and low appetite, developed AKI due to significant diarrhea.   On 2/1 patient continued to have diarrhea, poor appetite and patient required IV fluid.   On 2/2 diarrhea resolved, AKI resolved, appetite is improving.  Decreased IV fluid NS 50 mL/h for gentle hydration, patient has significant dementia and may not drink enough water to keep himself hydrated.   Diet: Carb modified diet DVT Prophylaxis: Therapeutic Anticoagulation with Eliquis    Advance goals of care discussion: Full code  Family Communication: family was not present at bedside, at the time of interview.  The pt provided permission to discuss medical plan with the family. Opportunity was given to ask question and all questions were answered satisfactorily.  Patient has significant dementia AAO x 1  Disposition:  Pt is from Home, admitted with worsening of dementia, awaiting in the ED for placement, developed diarrhea, dehydration and AKI so admitted as an inpatient for IV hydration.  AKI resolved, appetite improved, diarrhea is improving.  Patient is clinically improving to discharge, awaiting placement. Discharge to LTC placement, when bed available..  Subjective: No significant events overnight, denied any diarrhea, no nausea vomiting, no abdominal pain.  Patient was asking why he had a diarrhea, explained that it could be a viral infection.  And it seems resolved.  Patient denies any other complaints.   Physical Exam: General: NAD, lying comfortably Appear in no distress, affect appropriate Eyes: PERRLA ENT: Oral Mucosa Clear, moist  Neck: no JVD,  Cardiovascular: S1 and S2 Present, no Murmur,  Respiratory: good respiratory effort, Bilateral Air entry equal and Decreased, no Crackles, no wheezes Abdomen: Bowel Sound present, Soft and no tenderness,  Skin: no rashes Extremities: no Pedal edema, no calf tenderness Neurologic: without any new focal  findings, AAO x 1 Gait not checked due to patient safety concerns  Vitals:   07/10/22 1437 07/10/22 1925 07/11/22 0322 07/11/22 0756  BP: 122/71 110/65 113/69 108/66  Pulse: 83 92 74 70  Resp: 19 20 16 18   Temp: 99.1 F (37.3 C) 98.7 F (37.1 C) 98.4 F (36.9 C) 97.7 F (36.5 C)  TempSrc:  Oral Oral Oral  SpO2: 100% 96% 98% 95%  Weight:      Height:        Intake/Output Summary (Last 24 hours) at 07/11/2022 1154 Last data filed at 07/11/2022 0417 Gross per 24 hour  Intake 956.89 ml  Output 0 ml  Net 956.89 ml   Filed Weights   06/17/22 1549 07/08/22 1841  Weight: 86 kg 84 kg    Data Reviewed: I have personally reviewed and interpreted daily labs, tele strips, imagings as discussed above. I reviewed all nursing notes, pharmacy notes, vitals, pertinent old records I have discussed plan of care as described above with RN and patient/family.  CBC: Recent Labs  Lab 07/05/22 0419 07/08/22 0222 07/08/22 0549 07/08/22 1217 07/08/22 1848 07/10/22 0533 07/11/22 0357  WBC 4.7 5.5 5.4  --   --  5.6 4.4  NEUTROABS  --  3.0  --   --   --   --   --   HGB 12.1*  12.3* 11.5* 11.8* 12.1* 12.2* 11.7*  HCT 36.4* 37.8* 35.3* 36.3* 36.4* 37.2* 35.8*  MCV 101.7* 102.7* 102.6*  --   --  100.0 101.7*  PLT 265 249 228  --   --  225 161   Basic Metabolic Panel: Recent Labs  Lab 07/08/22 0222 07/08/22 0549 07/09/22 0837 07/10/22 0533 07/11/22 0609  NA 136 135 140 141 137  K 4.9 4.9 4.6 4.1 4.1  CL 102 105 110 109 107  CO2 26 23 22 23  21*  GLUCOSE 237* 208* 156* 68* 100*  BUN 41* 38* 33* 23 36*  CREATININE 1.52* 1.31* 1.50* 1.14 1.41*  CALCIUM 9.4 8.7* 9.1 9.1 8.8*  MG  --   --   --  2.0 2.4  PHOS  --   --   --  4.9* 5.0*    Studies: No results found.  Scheduled Meds:  apixaban  5 mg Oral BID   atorvastatin  40 mg Oral Daily   feeding supplement (GLUCERNA SHAKE)  237 mL Oral TID BM   ferrous sulfate  325 mg Oral Q breakfast   gabapentin  300 mg Oral Daily   insulin  aspart  0-15 Units Subcutaneous TID AC & HS   melatonin  2.5 mg Oral QHS   metoprolol tartrate  25 mg Oral BID   QUEtiapine  25 mg Oral BID   saccharomyces boulardii  250 mg Oral BID   Continuous Infusions:  sodium chloride 100 mL/hr at 07/11/22 0950   PRN Meds: acetaminophen **OR** acetaminophen, haloperidol lactate, hydrALAZINE, hydrALAZINE, loperamide, LORazepam **OR** LORazepam, ondansetron **OR** ondansetron (ZOFRAN) IV, traZODone, witch hazel-glycerin  Time spent: 35 minutes  Author: Val Riles. MD [facility] 07/11/2022 11:54 AM  To reach On-call, see care teams to locate the attending and reach out to them via [email or web address]. If 7PM-7AM, please contact night-coverage If you still have difficulty reaching the attending provider, please page the [facility] (Director on Call) for [facility] on amion for assistance.

## 2022-07-11 NOTE — Progress Notes (Signed)
Pt initially agitated, looking for cigarettes to smoke. Pt was hard to redirect, he roamed around the hallway, and went in other pts room; security called. RN initially offered Ativan PO but pt refused all PO medications. He also refused vitals and blood sugar check. RN administered Haldol IV prn. Afterwards, pt was a lot more agreeable regarding vitals and blood sugar check. Pt allowed RN to administer SQ insulin in exchange of sandwich tray. Pt is now resting comfortably in bed; 1:1 sitter at bedside.    07/11/22 2209  Vitals  Temp 97.7 F (36.5 C)  Temp Source Oral  BP 120/72  MAP (mmHg) 85  BP Location Right Arm  BP Method Automatic  Patient Position (if appropriate) Sitting  Pulse Rate 85  Pulse Rate Source Monitor  Resp 18  Level of Consciousness  Level of Consciousness Alert  MEWS COLOR  MEWS Score Color Green  Oxygen Therapy  SpO2 97 %  O2 Device Room Air  Pain Assessment  Pain Scale 0-10  Pain Score 0

## 2022-07-12 DIAGNOSIS — N179 Acute kidney failure, unspecified: Secondary | ICD-10-CM | POA: Diagnosis not present

## 2022-07-12 LAB — CBC
HCT: 35.5 % — ABNORMAL LOW (ref 39.0–52.0)
Hemoglobin: 11.6 g/dL — ABNORMAL LOW (ref 13.0–17.0)
MCH: 33.2 pg (ref 26.0–34.0)
MCHC: 32.7 g/dL (ref 30.0–36.0)
MCV: 101.7 fL — ABNORMAL HIGH (ref 80.0–100.0)
Platelets: 204 10*3/uL (ref 150–400)
RBC: 3.49 MIL/uL — ABNORMAL LOW (ref 4.22–5.81)
RDW: 12.9 % (ref 11.5–15.5)
WBC: 5.3 10*3/uL (ref 4.0–10.5)
nRBC: 0 % (ref 0.0–0.2)

## 2022-07-12 LAB — BASIC METABOLIC PANEL
Anion gap: 7 (ref 5–15)
BUN: 37 mg/dL — ABNORMAL HIGH (ref 8–23)
CO2: 20 mmol/L — ABNORMAL LOW (ref 22–32)
Calcium: 9 mg/dL (ref 8.9–10.3)
Chloride: 111 mmol/L (ref 98–111)
Creatinine, Ser: 1.33 mg/dL — ABNORMAL HIGH (ref 0.61–1.24)
GFR, Estimated: 55 mL/min — ABNORMAL LOW (ref >60)
Glucose, Bld: 304 mg/dL — ABNORMAL HIGH (ref 70–99)
Potassium: 4.8 mmol/L (ref 3.5–5.1)
Sodium: 138 mmol/L (ref 135–145)

## 2022-07-12 LAB — GLUCOSE, CAPILLARY
Glucose-Capillary: 287 mg/dL — ABNORMAL HIGH (ref 70–99)
Glucose-Capillary: 432 mg/dL — ABNORMAL HIGH (ref 70–99)
Glucose-Capillary: 84 mg/dL (ref 70–99)
Glucose-Capillary: 153 mg/dL — ABNORMAL HIGH (ref 70–99)

## 2022-07-12 LAB — PHOSPHORUS: Phosphorus: 3.2 mg/dL (ref 2.5–4.6)

## 2022-07-12 LAB — MAGNESIUM: Magnesium: 2.2 mg/dL (ref 1.7–2.4)

## 2022-07-12 MED ORDER — TRAZODONE HCL 50 MG PO TABS
25.0000 mg | ORAL_TABLET | Freq: Every day | ORAL | Status: DC
  Administered 2022-07-12 – 2022-07-22 (×11): 25 mg via ORAL
  Filled 2022-07-12 (×11): qty 1

## 2022-07-12 MED ORDER — MELATONIN 5 MG PO TABS
5.0000 mg | ORAL_TABLET | Freq: Every day | ORAL | Status: DC
  Administered 2022-07-12 – 2022-07-22 (×11): 5 mg via ORAL
  Filled 2022-07-12 (×11): qty 1

## 2022-07-12 MED ORDER — INSULIN ASPART 100 UNIT/ML IJ SOLN: 10.0000 [IU] | Freq: Once | INTRAMUSCULAR | Status: DC

## 2022-07-12 MED ORDER — INSULIN ASPART 100 UNIT/ML IV SOLN
10.0000 [IU] | Freq: Once | INTRAVENOUS | Status: AC
  Administered 2022-07-12: 10 [IU] via INTRAVENOUS
  Filled 2022-07-12: qty 0.1

## 2022-07-12 NOTE — Progress Notes (Signed)
[facility] Progress Note  Patient: Mitchell Price    DVV:[id number]  DOA: 06/17/2022     Date of Service: the patient was seen and examined on 07/12/2022  Chief Complaint  Patient presents with   Fall   Brief hospital course: Mitchell Price is a 77 y.o. male with a PMH significant for type  2 diabetes mellitus, essential hypertension, history of DVT on Eliquis, history of PUD, diverticulosis, type diabetes mellitus and dyslipidemia.   They presented from ED to the ED on 06/17/2022 with intractable diarrhea x 1 days. He had been boarding in the ED awaiting long-term placement when he developed intractable diarrhea with incontinence. The dehydration that resulted contributed to an AKI.    In the ED, it was found that they had BP of 133/84 with temperature of 98, heart rate of 83 and respiratory rate of 20 with pulse oximetry of 96% on room air.  Significant findings included:  BUN of 41 and creatinine 1.52 compared to 22/1.11 on 1/10 and blood glucose of 237.  CBC showed hemoglobin 12.3 and hematocrit 37.8 with WBC of 5.5 and platelets 249.  Previous H&H were close. EKG On 1/10 showed normal sinus rhythm with a rate of 81 with left axis deviation and prolonged QT interval with QTc of 480 MS. Imaging: Most recent two-view chest x-ray on 1/10 showed linear scarring or atelectasis at the right lung base.  Abdominal pelvic CT scan this morning revealed bilateral hyperdense renal lesions that may be hemorrhagic/proteinaceous cysts with no acute abnormality in the abdomen or pelvis.  It showed cholelithiasis and body wall edema.  There was a recommendation for further follow-up with nonemergent MRI of the abdomen with and without contrast to rule out malignancy.   They were initially treated with 4 mg of IV Zofran and 1 L bolus of IV lactated Ringer and stool pathogen panel was ordered. Patient was not agreeable to sample collection.    Patient was admitted to medicine service for further  workup and management of diarrhea as outlined in detail below.   Assessment and Plan: Principal Problem:   AKI (acute kidney injury) ([place]) Active Problems:   Memory changes   Intractable diarrhea   Uncontrolled type 2 diabetes mellitus with hyperglycemia, with long-term current use of insulin (HCC)   Dyslipidemia   History of DVT (deep vein thrombosis)   Essential hypertension   Diabetic neuropathy (HCC)   # AKI, prerenal due to dehydration secondary to diarrhea Cr 1.52--1.3--1.14--1.33 gradually improving 2/4 decreased IVF NS 50 ml/hr RN was advised to encourage for oral fluid intake  - repeat BMP am   # Intractable diarrhea- Resolved  His abdominal exam is benign and abdominal CT scan showed no acute abdominal or pelvic abnormalities. - f/u stool sample for pathogen panel -Diarrhea is improving, we will continue to monitor hydration status. Started Florastor  # Dementia- patient is oriented to self only and poorly redirected. His son stated that he is at his baseline. Will need placement - TOC engaged for placement - 1:1 sitter - delirium precautions - seroquel  - PRN haldol, ativan   # Bed bugs- found after admission. Room/fabrics cleaned/replaced   # Uncontrolled type 2 diabetes mellitus with hyperglycemia, with long-term current use of insulin ([place]) On 2/2 patient had an episode of hypoglycemia, discontinued Semglee and discontinued home oral antidiabetic medications.   Follow hypoglycemia protocol Continue NovoLog sliding scale, continue diabetic diet. Monitor FSBG.    Dyslipidemia - continue statin therapy.   Essential  hypertension-  -  hold off lisinopril given his acute kidney injury. 2/2 started Lopressor with holding parameters. Use hydralazine po or IV as per BP parameters  History of DVT (deep vein thrombosis) - continue Eliquis    Diabetic neuropathy - continue Neurontin.  Insomnia, started melatonin 5 mg p.o. nightly and trazodone 25 mg p.o.  nightly   Body mass index is 21.96 kg/m.  Interventions:   On admission patient had severe dehydration and low appetite, developed AKI due to significant diarrhea.   On 2/1 patient continued to have diarrhea, poor appetite and patient required IV fluid.   On 2/2 diarrhea resolved, AKI resolved, appetite is improving.  Decreased IV fluid NS 50 mL/h for gentle hydration, patient has significant dementia and may not drink enough water to keep himself hydrated.   Diet: Carb modified diet DVT Prophylaxis: Therapeutic Anticoagulation with Eliquis    Advance goals of care discussion: Full code  Family Communication: family was not present at bedside, at the time of interview.  The pt provided permission to discuss medical plan with the family. Opportunity was given to ask question and all questions were answered satisfactorily.  Patient has significant dementia AAO x 1  Disposition:  Pt is from Home, admitted with worsening of dementia, awaiting in the ED for placement, developed diarrhea, dehydration and AKI so admitted as an inpatient for IV hydration.  AKI resolved, appetite improved, diarrhea is improving.  Patient is clinically improving to discharge, awaiting placement. Discharge to LTC placement, when bed available..  Subjective: No significant events overnight, no diarrhea, denies any nausea vomiting or abdominal pain.  Patient is voiding well without any complaints. Lack of sleep, patient is not complaining but RN reported that patient is not sleeping most of the time he is awake.   Physical Exam: General: NAD, sitting comfortably in the chair Appear in no distress, affect appropriate Eyes: PERRLA ENT: Oral Mucosa Clear, moist  Neck: no JVD,  Cardiovascular: S1 and S2 Present, no Murmur,  Respiratory: good respiratory effort, Bilateral Air entry equal and Decreased, no Crackles, no wheezes Abdomen: Bowel Sound present, Soft and no tenderness,  Skin: no rashes Extremities: no  Pedal edema, no calf tenderness Neurologic: without any new focal findings, AAO x 1 Gait not checked due to patient safety concerns  Vitals:   07/11/22 2209 07/12/22 0448 07/12/22 0854 07/12/22 0858  BP: 120/72 (!) 141/86  (!) 120/56  Pulse: 85 88 84 84  Resp: 18 18  16   Temp: 97.7 F (36.5 C) 98.4 F (36.9 C) 98.7 F (37.1 C) 98.7 F (37.1 C)  TempSrc: Oral  Oral Oral  SpO2: 97% 98%  99%  Weight:      Height:        Intake/Output Summary (Last 24 hours) at 07/12/2022 1214 Last data filed at 07/12/2022 9563 Gross per 24 hour  Intake 3203.52 ml  Output --  Net 3203.52 ml   Filed Weights   06/17/22 1549 07/08/22 1841  Weight: 86 kg 84 kg    Data Reviewed: I have personally reviewed and interpreted daily labs, tele strips, imagings as discussed above. I reviewed all nursing notes, pharmacy notes, vitals, pertinent old records I have discussed plan of care as described above with RN and patient/family.  CBC: Recent Labs  Lab 07/08/22 0222 07/08/22 0549 07/08/22 1217 07/08/22 1848 07/10/22 0533 07/11/22 0357 07/12/22 0516  WBC 5.5 5.4  --   --  5.6 4.4 5.3  NEUTROABS 3.0  --   --   --   --   --   --  HGB 12.3* 11.5* 11.8* 12.1* 12.2* 11.7* 11.6*  HCT 37.8* 35.3* 36.3* 36.4* 37.2* 35.8* 35.5*  MCV 102.7* 102.6*  --   --  100.0 101.7* 101.7*  PLT 249 228  --   --  225 208 916   Basic Metabolic Panel: Recent Labs  Lab 07/08/22 0549 07/09/22 0837 07/10/22 0533 07/11/22 0609 07/12/22 0516  NA 135 140 141 137 138  K 4.9 4.6 4.1 4.1 4.8  CL 105 110 109 107 111  CO2 23 22 23  21* 20*  GLUCOSE 208* 156* 68* 100* 304*  BUN 38* 33* 23 36* 37*  CREATININE 1.31* 1.50* 1.14 1.41* 1.33*  CALCIUM 8.7* 9.1 9.1 8.8* 9.0  MG  --   --  2.0 2.4 2.2  PHOS  --   --  4.9* 5.0* 3.2    Studies: No results found.  Scheduled Meds:  apixaban  5 mg Oral BID   atorvastatin  40 mg Oral Daily   feeding supplement (GLUCERNA SHAKE)  237 mL Oral TID BM   ferrous sulfate  325 mg  Oral Q breakfast   gabapentin  300 mg Oral Daily   insulin aspart  0-15 Units Subcutaneous TID AC & HS   melatonin  2.5 mg Oral QHS   metoprolol tartrate  25 mg Oral BID   QUEtiapine  25 mg Oral BID   saccharomyces boulardii  250 mg Oral BID   Continuous Infusions:  sodium chloride 50 mL/hr at 07/12/22 0925   PRN Meds: acetaminophen **OR** acetaminophen, hydrALAZINE, hydrALAZINE, loperamide, LORazepam **OR** LORazepam, ondansetron **OR** ondansetron (ZOFRAN) IV, traZODone, witch hazel-glycerin  Time spent: 35 minutes  Author: Val Riles. MD [facility] 07/12/2022 12:14 PM  To reach On-call, see care teams to locate the attending and reach out to them via [email or web address]. If 7PM-7AM, please contact night-coverage If you still have difficulty reaching the attending provider, please page the [facility] (Director on Call) for [facility] on amion for assistance.

## 2022-07-13 DIAGNOSIS — N179 Acute kidney failure, unspecified: Secondary | ICD-10-CM | POA: Diagnosis not present

## 2022-07-13 LAB — PHOSPHORUS: Phosphorus: 3.2 mg/dL (ref 2.5–4.6)

## 2022-07-13 LAB — GLUCOSE, CAPILLARY
Glucose-Capillary: 51 mg/dL — ABNORMAL LOW (ref 70–99)
Glucose-Capillary: 184 mg/dL — ABNORMAL HIGH (ref 70–99)
Glucose-Capillary: 332 mg/dL — ABNORMAL HIGH (ref 70–99)
Glucose-Capillary: 178 mg/dL — ABNORMAL HIGH (ref 70–99)
Glucose-Capillary: 330 mg/dL — ABNORMAL HIGH (ref 70–99)
Glucose-Capillary: 412 mg/dL — ABNORMAL HIGH (ref 70–99)

## 2022-07-13 LAB — CBC
HCT: 32.2 % — ABNORMAL LOW (ref 39.0–52.0)
Hemoglobin: 10.7 g/dL — ABNORMAL LOW (ref 13.0–17.0)
MCH: 33.8 pg (ref 26.0–34.0)
MCHC: 33.2 g/dL (ref 30.0–36.0)
MCV: 101.6 fL — ABNORMAL HIGH (ref 80.0–100.0)
Platelets: 183 10*3/uL (ref 150–400)
RBC: 3.17 MIL/uL — ABNORMAL LOW (ref 4.22–5.81)
RDW: 12.6 % (ref 11.5–15.5)
WBC: 5.2 10*3/uL (ref 4.0–10.5)
nRBC: 0 % (ref 0.0–0.2)

## 2022-07-13 LAB — BASIC METABOLIC PANEL
Anion gap: 7 (ref 5–15)
BUN: 37 mg/dL — ABNORMAL HIGH (ref 8–23)
CO2: 21 mmol/L — ABNORMAL LOW (ref 22–32)
Calcium: 8.8 mg/dL — ABNORMAL LOW (ref 8.9–10.3)
Chloride: 110 mmol/L (ref 98–111)
Creatinine, Ser: 1.35 mg/dL — ABNORMAL HIGH (ref 0.61–1.24)
GFR, Estimated: 54 mL/min — ABNORMAL LOW (ref >60)
Glucose, Bld: 150 mg/dL — ABNORMAL HIGH (ref 70–99)
Potassium: 4.6 mmol/L (ref 3.5–5.1)
Sodium: 138 mmol/L (ref 135–145)

## 2022-07-13 LAB — MAGNESIUM: Magnesium: 2 mg/dL (ref 1.7–2.4)

## 2022-07-13 MED ORDER — INSULIN ASPART 100 UNIT/ML IJ SOLN
4.0000 [IU] | Freq: Once | INTRAMUSCULAR | Status: AC
  Administered 2022-07-13: 4 [IU] via SUBCUTANEOUS

## 2022-07-13 NOTE — Progress Notes (Signed)
[facility] Progress Note  Patient: Mitchell Price    ZOX:[id number]  DOA: 06/17/2022     Date of Service: the patient was seen and examined on 07/13/2022  Chief Complaint  Patient presents with   Fall   Brief hospital course: HERSHALL BENKERT is a 77 y.o. male with a PMH significant for type  2 diabetes mellitus, essential hypertension, history of DVT on Eliquis, history of PUD, diverticulosis, type diabetes mellitus and dyslipidemia.   They presented from ED to the ED on 06/17/2022 with intractable diarrhea x 1 days. He had been boarding in the ED awaiting long-term placement when he developed intractable diarrhea with incontinence. The dehydration that resulted contributed to an AKI.    In the ED, it was found that they had BP of 133/84 with temperature of 98, heart rate of 83 and respiratory rate of 20 with pulse oximetry of 96% on room air.  Significant findings included:  BUN of 41 and creatinine 1.52 compared to 22/1.11 on 1/10 and blood glucose of 237.  CBC showed hemoglobin 12.3 and hematocrit 37.8 with WBC of 5.5 and platelets 249.  Previous H&H were close. EKG On 1/10 showed normal sinus rhythm with a rate of 81 with left axis deviation and prolonged QT interval with QTc of 480 MS. Imaging: Most recent two-view chest x-ray on 1/10 showed linear scarring or atelectasis at the right lung base.  Abdominal pelvic CT scan this morning revealed bilateral hyperdense renal lesions that may be hemorrhagic/proteinaceous cysts with no acute abnormality in the abdomen or pelvis.  It showed cholelithiasis and body wall edema.  There was a recommendation for further follow-up with nonemergent MRI of the abdomen with and without contrast to rule out malignancy.   They were initially treated with 4 mg of IV Zofran and 1 L bolus of IV lactated Ringer and stool pathogen panel was ordered. Patient was not agreeable to sample collection.    Patient was admitted to medicine service for further  workup and management of diarrhea as outlined in detail below.   Assessment and Plan: Principal Problem:   AKI (acute kidney injury) ([place]) Active Problems:   Memory changes   Intractable diarrhea   Uncontrolled type 2 diabetes mellitus with hyperglycemia, with long-term current use of insulin (HCC)   Dyslipidemia   History of DVT (deep vein thrombosis)   Essential hypertension   Diabetic neuropathy (HCC)   # AKI, prerenal due to dehydration secondary to diarrhea Cr 1.52--1.3--1.14--1.35 gradually improving 2/4 decreased IVF NS 50 ml/hr RN was advised to encourage for oral fluid intake  - repeat BMP am   # Intractable diarrhea- Resolved  His abdominal exam is benign and abdominal CT scan showed no acute abdominal or pelvic abnormalities. - f/u stool sample for pathogen panel -Diarrhea is improving, we will continue to monitor hydration status. Started Florastor  # Dementia- patient is oriented to self only and poorly redirected. His son stated that he is at his baseline. Will need placement - TOC engaged for placement - 1:1 sitter - delirium precautions - seroquel  - PRN haldol, ativan   # Bed bugs- found after admission. Room/fabrics cleaned/replaced   # Uncontrolled type 2 diabetes mellitus with hyperglycemia, with long-term current use of insulin ([place]) On 2/2 patient had an episode of hypoglycemia, discontinued Semglee and discontinued home oral antidiabetic medications.   Follow hypoglycemia protocol Continue NovoLog sliding scale, continue diabetic diet. Monitor FSBG.    Dyslipidemia - continue statin therapy.   Essential  hypertension-  -  hold off lisinopril given his acute kidney injury. 2/2 started Lopressor with holding parameters. Use hydralazine po or IV as per BP parameters  History of DVT (deep vein thrombosis) - continue Eliquis    Diabetic neuropathy - continue Neurontin.  Insomnia, started melatonin 5 mg p.o. nightly and trazodone 25 mg p.o.  nightly   Body mass index is 21.96 kg/m.  Interventions:   On admission patient had severe dehydration and low appetite, developed AKI due to significant diarrhea.   On 2/1 patient continued to have diarrhea, poor appetite and patient required IV fluid.   On 2/2 diarrhea resolved, AKI resolved, appetite is improving.  Decreased IV fluid NS 50 mL/h for gentle hydration, patient has significant dementia and may not drink enough water to keep himself hydrated. 2/5 d/c'd IVF   Diet: Carb modified diet DVT Prophylaxis: Therapeutic Anticoagulation with Eliquis    Advance goals of care discussion: Full code  Family Communication: family was not present at bedside, at the time of interview.  The pt provided permission to discuss medical plan with the family. Opportunity was given to ask question and all questions were answered satisfactorily.  Patient has significant dementia AAO x 1  Disposition:  Pt is from Home, admitted with worsening of dementia, awaiting in the ED for placement, developed diarrhea, dehydration and AKI so admitted as an inpatient for IV hydration.  AKI resolved, appetite improved, diarrhea is improving.  Patient is clinically improving to discharge, awaiting placement. Discharge to LTC placement, when bed available..  Subjective: No significant events overnight, patient was eating breakfast, denies any active issues, no diarrhea, no nausea vomiting, no abdominal pain.  No chest pain or palpitation, no shortness of breath.    Physical Exam: General: NAD, sitting comfortably in the chair Appear in no distress, affect appropriate Eyes: PERRLA ENT: Oral Mucosa Clear, moist  Neck: no JVD,  Cardiovascular: S1 and S2 Present, no Murmur,  Respiratory: good respiratory effort, Bilateral Air entry equal and Decreased, no Crackles, no wheezes Abdomen: Bowel Sound present, Soft and no tenderness,  Skin: no rashes Extremities: no Pedal edema, no calf tenderness Neurologic:  without any new focal findings, AAO x 1 Gait not checked due to patient safety concerns  Vitals:   07/12/22 0858 07/12/22 1927 07/13/22 0655 07/13/22 0752  BP: (!) 120/56 110/62 (!) 97/52 117/70  Pulse: 84 82 66 70  Resp: 16 18 18 16   Temp: 98.7 F (37.1 C) 97.8 F (36.6 C) 98.5 F (36.9 C) 98.4 F (36.9 C)  TempSrc: Oral     SpO2: 99% 99% 97% 100%  Weight:      Height:        Intake/Output Summary (Last 24 hours) at 07/13/2022 1415 Last data filed at 07/13/2022 1253 Gross per 24 hour  Intake 2339.68 ml  Output 1 ml  Net 2338.68 ml   Filed Weights   06/17/22 1549 07/08/22 1841  Weight: 86 kg 84 kg    Data Reviewed: I have personally reviewed and interpreted daily labs, tele strips, imagings as discussed above. I reviewed all nursing notes, pharmacy notes, vitals, pertinent old records I have discussed plan of care as described above with RN and patient/family.  CBC: Recent Labs  Lab 07/08/22 0222 07/08/22 0549 07/08/22 1217 07/08/22 1848 07/10/22 0533 07/11/22 0357 07/12/22 0516 07/13/22 0351  WBC 5.5 5.4  --   --  5.6 4.4 5.3 5.2  NEUTROABS 3.0  --   --   --   --   --   --   --  HGB 12.3* 11.5*   < > 12.1* 12.2* 11.7* 11.6* 10.7*  HCT 37.8* 35.3*   < > 36.4* 37.2* 35.8* 35.5* 32.2*  MCV 102.7* 102.6*  --   --  100.0 101.7* 101.7* 101.6*  PLT 249 228  --   --  225 208 204 183   < > = values in this interval not displayed.   Basic Metabolic Panel: Recent Labs  Lab 07/09/22 0837 07/10/22 0533 07/11/22 0609 07/12/22 0516 07/13/22 0351  NA 140 141 137 138 138  K 4.6 4.1 4.1 4.8 4.6  CL 110 109 107 111 110  CO2 22 23 21* 20* 21*  GLUCOSE 156* 68* 100* 304* 150*  BUN 33* 23 36* 37* 37*  CREATININE 1.50* 1.14 1.41* 1.33* 1.35*  CALCIUM 9.1 9.1 8.8* 9.0 8.8*  MG  --  2.0 2.4 2.2 2.0  PHOS  --  4.9* 5.0* 3.2 3.2    Studies: No results found.  Scheduled Meds:  apixaban  5 mg Oral BID   atorvastatin  40 mg Oral Daily   feeding supplement (GLUCERNA  SHAKE)  237 mL Oral TID BM   ferrous sulfate  325 mg Oral Q breakfast   gabapentin  300 mg Oral Daily   insulin aspart  0-15 Units Subcutaneous TID AC & HS   melatonin  5 mg Oral QHS   metoprolol tartrate  25 mg Oral BID   QUEtiapine  25 mg Oral BID   saccharomyces boulardii  250 mg Oral BID   traZODone  25 mg Oral QHS   Continuous Infusions:  sodium chloride Stopped (07/12/22 1526)   PRN Meds: acetaminophen **OR** acetaminophen, hydrALAZINE, hydrALAZINE, loperamide, LORazepam **OR** LORazepam, ondansetron **OR** ondansetron (ZOFRAN) IV, witch hazel-glycerin  Time spent: 35 minutes  Author: Val Riles. MD [facility] 07/13/2022 2:15 PM  To reach On-call, see care teams to locate the attending and reach out to them via [email or web address]. If 7PM-7AM, please contact night-coverage If you still have difficulty reaching the attending provider, please page the [facility] (Director on Call) for [facility] on amion for assistance.

## 2022-07-13 NOTE — Plan of Care (Signed)
  Problem: Nutritional: Goal: Maintenance of adequate nutrition will improve Outcome: Progressing   Problem: Skin Integrity: Goal: Risk for impaired skin integrity will decrease Outcome: Progressing   Problem: Activity: Goal: Risk for activity intolerance will decrease Outcome: Progressing   Problem: Elimination: Goal: Will not experience complications related to urinary retention Outcome: Progressing   Problem: Safety: Goal: Ability to remain free from injury will improve Outcome: Progressing

## 2022-07-13 NOTE — Progress Notes (Signed)
       CROSS COVER NOTE  NAME: Mitchell Price MRN: [id number] DOB : 05-16-1946 ATTENDING PHYSICIAN: Val Riles, MD    Date of Service   07/13/2022   HPI/Events of Note   Notified of elevated CBG--> 412  Interventions   Assessment/Plan:  4 additional units of novolog ordered for a total of 19U      To reach the provider On-Call:   7AM- 7PM see care teams to locate the attending and reach out to them via [email or web address] Password: [email or web address] 7PM-7AM contact night-coverage If you still have difficulty reaching the appropriate provider, please page the [facility] (Director on Call) for [facility] on amion for assistance  This document was prepared using Systems analyst and may include unintentional dictation errors.  Neomia Glass DNP, MBA, FNP-BC, PMHNP-BC Nurse Practitioner [facility] Pager [phone]

## 2022-07-13 NOTE — Progress Notes (Signed)
Mobility Specialist - Progress Note   07/13/22 1300  Mobility  Activity Ambulated with assistance in hallway  Level of Assistance Standby assist, set-up cues, supervision of patient - no hands on  Assistive Device None  Distance Ambulated (ft) 200 ft  Activity Response Tolerated well  $Mobility charge 1 Mobility     Pt sitting EOB on arrival, utilizing RA. Pt STS and ambulated in hallway with supervision. No LOB without AD. Pt reports having LLE pain and buckling when ambulating, but no buckling noted during session. Voiced SOB with exertion. O2 high 90s post-session. Pt ambulated to recliner with needs in reach, sitter at bedside.    Kathee Delton Mobility Specialist 07/13/22, 1:21 PM

## 2022-07-13 NOTE — TOC Progression Note (Signed)
Transition of Care [facility]) - Progression Note    Patient Details  Name: Mitchell Price MRN: [id number] Date of Birth: June 10, 1945  Transition of Care [facility] CM/SW Contact  Beverly Sessions, RN Phone Number: 07/13/2022, 2:04 PM  Clinical Narrative:     [employer] [employer] confirms he still has availability at [facility] [place]. Will await SA application through DSS.  Son has appointment tomorrow.  Per [place] [employer] he will need to know first if it is likely that patient is going to qualify   Expected Discharge Plan: Assisted Living Barriers to Discharge: ED Unsafe disposition  Expected Discharge Plan and Services   Discharge Planning Services: CM Consult   Living arrangements for the past 2 months: Single Family Home                 DME Arranged: N/A DME Agency: NA                   Social Determinants of Health (SDOH) Interventions SDOH Screenings   Tobacco Use: High Risk (07/08/2022)    Readmission Risk Interventions     No data to display

## 2022-07-14 DIAGNOSIS — N179 Acute kidney failure, unspecified: Secondary | ICD-10-CM | POA: Diagnosis not present

## 2022-07-14 LAB — GLUCOSE, CAPILLARY
Glucose-Capillary: 263 mg/dL — ABNORMAL HIGH (ref 70–99)
Glucose-Capillary: 311 mg/dL — ABNORMAL HIGH (ref 70–99)
Glucose-Capillary: 226 mg/dL — ABNORMAL HIGH (ref 70–99)

## 2022-07-14 LAB — CBC
HCT: 38.8 % — ABNORMAL LOW (ref 39.0–52.0)
Hemoglobin: 12.7 g/dL — ABNORMAL LOW (ref 13.0–17.0)
MCH: 33.5 pg (ref 26.0–34.0)
MCHC: 32.7 g/dL (ref 30.0–36.0)
MCV: 102.4 fL — ABNORMAL HIGH (ref 80.0–100.0)
Platelets: 206 10*3/uL (ref 150–400)
RBC: 3.79 MIL/uL — ABNORMAL LOW (ref 4.22–5.81)
RDW: 12.8 % (ref 11.5–15.5)
WBC: 4.9 10*3/uL (ref 4.0–10.5)
nRBC: 0 % (ref 0.0–0.2)

## 2022-07-14 LAB — BASIC METABOLIC PANEL
Anion gap: 8 (ref 5–15)
BUN: 39 mg/dL — ABNORMAL HIGH (ref 8–23)
CO2: 23 mmol/L (ref 22–32)
Calcium: 9.5 mg/dL (ref 8.9–10.3)
Chloride: 105 mmol/L (ref 98–111)
Creatinine, Ser: 1.45 mg/dL — ABNORMAL HIGH (ref 0.61–1.24)
GFR, Estimated: 49 mL/min — ABNORMAL LOW (ref >60)
Glucose, Bld: 187 mg/dL — ABNORMAL HIGH (ref 70–99)
Potassium: 4.8 mmol/L (ref 3.5–5.1)
Sodium: 136 mmol/L (ref 135–145)

## 2022-07-14 MED ORDER — TAMSULOSIN HCL 0.4 MG PO CAPS
0.4000 mg | ORAL_CAPSULE | Freq: Every day | ORAL | Status: DC
  Administered 2022-07-14 – 2022-07-22 (×8): 0.4 mg via ORAL
  Filled 2022-07-14 (×9): qty 1

## 2022-07-14 MED ORDER — INSULIN GLARGINE-YFGN 100 UNIT/ML ~~LOC~~ SOLN
15.0000 [IU] | Freq: Every day | SUBCUTANEOUS | Status: DC
  Administered 2022-07-14 – 2022-07-16 (×3): 15 [IU] via SUBCUTANEOUS
  Filled 2022-07-14 (×4): qty 0.15

## 2022-07-14 MED ORDER — INSULIN ASPART 100 UNIT/ML IJ SOLN
3.0000 [IU] | Freq: Three times a day (TID) | INTRAMUSCULAR | Status: DC
  Administered 2022-07-14 – 2022-07-17 (×8): 3 [IU] via SUBCUTANEOUS
  Filled 2022-07-14 (×8): qty 1

## 2022-07-14 MED ORDER — SODIUM CHLORIDE 0.9 % IV SOLN: INTRAVENOUS | Status: DC

## 2022-07-14 NOTE — Progress Notes (Signed)
Mobility Specialist - Progress Note   07/14/22 1225  Mobility  Activity Ambulated with assistance in hallway  Level of Assistance Standby assist, set-up cues, supervision of patient - no hands on  Assistive Device None  Distance Ambulated (ft) 100 ft  Activity Response Tolerated well  $Mobility charge 1 Mobility     Pt sitting in recliner upon arrival, utilizing RA. Pt ambulated in hallway with supervision; does voice dizziness initially but does resolve. Mildly unsteady initially. Pt returned to room and ambulated to bathroom for standing ADLs and toileting before returning to recliner. Pt left in chair with sitter present.    Kathee Delton Mobility Specialist 07/14/22, 12:39 PM

## 2022-07-14 NOTE — Inpatient Diabetes Management (Signed)
Inpatient Diabetes Program Recommendations  AACE/ADA: New Consensus Statement on Inpatient Glycemic Control (2015)  Target Ranges:  Prepandial:   less than 140 mg/dL      Peak postprandial:   less than 180 mg/dL (1-2 hours)      Critically ill patients:  140 - 180 mg/dL   Lab Results  Component Value Date   GLUCAP 311 (H) 07/14/2022    Latest Reference Range & Units 07/13/22 07:41 07/13/22 11:17 07/13/22 17:02 07/13/22 19:49 07/13/22 21:52 07/14/22 08:19 07/14/22 12:11  Glucose-Capillary 70 - 99 mg/dL 184 (H) Novolog 3 units 332 (H) Novolog 11 units 178 (H) Novolog 3 units 330 (H) 412 (H) Total Novolog 19 units 263 (H) Novolog 8 units 311 (H) Novolog 11 units  (H): Data is abnormally high  Diabetes history: DM2 Outpatient Diabetes medications: Metformin 1000 mg BID, Actos 30 mg daily, Jardiance 25 mg daily, Amaryl 4 mg daily  Current orders for Inpatient glycemic control: Semglee 15 units q hs, Novolog 0-15 units tid + hs  Inpatient Diabetes Program Recommendations:   Noted Semglee 15 units ordered to start hs Please consider: -Add Novolog 3 units tid meal coverage if eats 50%  Thank you, Bethena Roys E. Laquetta Racey, RN, MSN, CDE  Diabetes Coordinator Inpatient Glycemic Control Team Team Pager [phone] (8am-5pm) 07/14/2022 2:08 PM

## 2022-07-14 NOTE — Progress Notes (Signed)
[facility] Progress Note  Patient: Mitchell Price    YCX:[id number]  DOA: 06/17/2022     Date of Service: the patient was seen and examined on 07/14/2022  Chief Complaint  Patient presents with   Fall   Brief hospital course: Mitchell Price is a 77 y.o. male with a PMH significant for type  2 diabetes mellitus, essential hypertension, history of DVT on Eliquis, history of PUD, diverticulosis, type diabetes mellitus and dyslipidemia.   They presented from ED to the ED on 06/17/2022 with intractable diarrhea x 1 days. He had been boarding in the ED awaiting long-term placement when he developed intractable diarrhea with incontinence. The dehydration that resulted contributed to an AKI.    In the ED, it was found that they had BP of 133/84 with temperature of 98, heart rate of 83 and respiratory rate of 20 with pulse oximetry of 96% on room air.  Significant findings included:  BUN of 41 and creatinine 1.52 compared to 22/1.11 on 1/10 and blood glucose of 237.  CBC showed hemoglobin 12.3 and hematocrit 37.8 with WBC of 5.5 and platelets 249.  Previous H&H were close. EKG On 1/10 showed normal sinus rhythm with a rate of 81 with left axis deviation and prolonged QT interval with QTc of 480 MS. Imaging: Most recent two-view chest x-ray on 1/10 showed linear scarring or atelectasis at the right lung base.  Abdominal pelvic CT scan this morning revealed bilateral hyperdense renal lesions that may be hemorrhagic/proteinaceous cysts with no acute abnormality in the abdomen or pelvis.  It showed cholelithiasis and body wall edema.  There was a recommendation for further follow-up with nonemergent MRI of the abdomen with and without contrast to rule out malignancy.   They were initially treated with 4 mg of IV Zofran and 1 L bolus of IV lactated Ringer and stool pathogen panel was ordered. Patient was not agreeable to sample collection.    Patient was admitted to medicine service for further  workup and management of diarrhea as outlined in detail below.   Assessment and Plan: Principal Problem:   AKI (acute kidney injury) ([place]) Active Problems:   Memory changes   Intractable diarrhea   Uncontrolled type 2 diabetes mellitus with hyperglycemia, with long-term current use of insulin (HCC)   Dyslipidemia   History of DVT (deep vein thrombosis)   Essential hypertension   Diabetic neuropathy (HCC)   # AKI, prerenal due to dehydration secondary to diarrhea Cr 1.52--1.3--1.14--1.35 gradually improving 2/4 decreased IVF NS 50 ml/hr, discontinued IV fluid on 2/6 RN was advised to encourage for oral fluid intake  2/6 urinary retention, bladder scan >900 ml, RN was advised to insert Foley catheter, started Flomax - repeat BMP am   # Intractable diarrhea- Resolved  His abdominal exam is benign and abdominal CT scan showed no acute abdominal or pelvic abnormalities. Stool sample was not sent for GIP, diarrhea resolved. continue to monitor hydration status. Started Florastor for 5 days  # Dementia- patient is oriented to self only and poorly redirected. His son stated that he is at his baseline. Will need placement - TOC engaged for placement - 1:1 sitter - delirium precautions - seroquel  - PRN haldol, ativan   # Bed bugs- found after admission. Room/fabrics cleaned/replaced   # Uncontrolled type 2 diabetes mellitus with hyperglycemia, with long-term current use of insulin ([place]) On 2/2 patient had an episode of hypoglycemia, discontinued Semglee and discontinued home oral antidiabetic medications.   Follow hypoglycemia  protocol Continue NovoLog sliding scale, continue diabetic diet. Monitor FSBG. 2/6 started Semglee 15 units nightly, consulted diabetic coordinator   Dyslipidemia - continue statin therapy.   Essential hypertension-  -  hold off lisinopril given his acute kidney injury. 2/2 started Lopressor with holding parameters. Use hydralazine po or IV as per BP  parameters  History of DVT (deep vein thrombosis) - continue Eliquis    Diabetic neuropathy - continue Neurontin.  Insomnia, started melatonin 5 mg p.o. nightly and trazodone 25 mg p.o. nightly   Body mass index is 21.96 kg/m.  Interventions:   On admission patient had severe dehydration and low appetite, developed AKI due to significant diarrhea.   On 2/1 patient continued to have diarrhea, poor appetite and patient required IV fluid.   On 2/2 diarrhea resolved, AKI resolved, appetite is improving.  Decreased IV fluid NS 50 mL/h for gentle hydration, patient has significant dementia and may not drink enough water to keep himself hydrated. 2/5 d/c'd IVF   Diet: Carb modified diet DVT Prophylaxis: Therapeutic Anticoagulation with Eliquis    Advance goals of care discussion: Full code  Family Communication: family was not present at bedside, at the time of interview.  The pt provided permission to discuss medical plan with the family. Opportunity was given to ask question and all questions were answered satisfactorily.  Patient has significant dementia AAO x 1  Disposition:  Pt is from Home, admitted with worsening of dementia, awaiting in the ED for placement, developed diarrhea, dehydration and AKI so admitted as an inpatient for IV hydration.  AKI resolved, appetite improved, diarrhea is improving.  Patient is clinically improving to discharge, awaiting placement. Discharge to LTC placement, when bed available..  Subjective: No significant events overnight, patient was sitting comfortably on the recliner, denied any complaints with bowel movements and no complaints with urination.  Denied any chest pain or palpitation, no shortness of breath.  Physical Exam: General: NAD, sitting comfortably in the chair Appear in no distress, affect appropriate Eyes: PERRLA ENT: Oral Mucosa Clear, moist  Neck: no JVD,  Cardiovascular: S1 and S2 Present, no Murmur,  Respiratory: good  respiratory effort, Bilateral Air entry equal and Decreased, no Crackles, no wheezes Abdomen: Bowel Sound present, Soft and no tenderness,  Skin: no rashes Extremities: no Pedal edema, no calf tenderness Neurologic: without any new focal findings, AAO x 1 Gait not checked due to patient safety concerns  Vitals:   07/13/22 1706 07/13/22 1954 07/14/22 0438 07/14/22 0822  BP: 129/80 112/79 (!) 119/92 110/69  Pulse: 79 81 75 71  Resp: 18 17 17 18   Temp: 98.5 F (36.9 C) 98.1 F (36.7 C) 98 F (36.7 C) 97.8 F (36.6 C)  TempSrc:  Oral Oral Oral  SpO2: 100% 98% 99% 100%  Weight:      Height:        Intake/Output Summary (Last 24 hours) at 07/14/2022 1341 Last data filed at 07/14/2022 0857 Gross per 24 hour  Intake 1000 ml  Output --  Net 1000 ml   Filed Weights   06/17/22 1549 07/08/22 1841  Weight: 86 kg 84 kg    Data Reviewed: I have personally reviewed and interpreted daily labs, tele strips, imagings as discussed above. I reviewed all nursing notes, pharmacy notes, vitals, pertinent old records I have discussed plan of care as described above with RN and patient/family.  CBC: Recent Labs  Lab 07/08/22 0222 07/08/22 1610 07/10/22 0533 07/11/22 9604 07/12/22 5409 07/13/22 0351 07/14/22 8119  WBC 5.5   < > 5.6 4.4 5.3 5.2 4.9  NEUTROABS 3.0  --   --   --   --   --   --   HGB 12.3*   < > 12.2* 11.7* 11.6* 10.7* 12.7*  HCT 37.8*   < > 37.2* 35.8* 35.5* 32.2* 38.8*  MCV 102.7*   < > 100.0 101.7* 101.7* 101.6* 102.4*  PLT 249   < > 225 208 204 183 206   < > = values in this interval not displayed.   Basic Metabolic Panel: Recent Labs  Lab 07/10/22 0533 07/11/22 0609 07/12/22 0516 07/13/22 0351 07/14/22 0614  NA 141 137 138 138 136  K 4.1 4.1 4.8 4.6 4.8  CL 109 107 111 110 105  CO2 23 21* 20* 21* 23  GLUCOSE 68* 100* 304* 150* 187*  BUN 23 36* 37* 37* 39*  CREATININE 1.14 1.41* 1.33* 1.35* 1.45*  CALCIUM 9.1 8.8* 9.0 8.8* 9.5  MG 2.0 2.4 2.2 2.0  --    PHOS 4.9* 5.0* 3.2 3.2  --     Studies: No results found.  Scheduled Meds:  apixaban  5 mg Oral BID   atorvastatin  40 mg Oral Daily   feeding supplement (GLUCERNA SHAKE)  237 mL Oral TID BM   ferrous sulfate  325 mg Oral Q breakfast   gabapentin  300 mg Oral Daily   insulin aspart  0-15 Units Subcutaneous TID AC & HS   melatonin  5 mg Oral QHS   metoprolol tartrate  25 mg Oral BID   QUEtiapine  25 mg Oral BID   saccharomyces boulardii  250 mg Oral BID   tamsulosin  0.4 mg Oral QPC supper   traZODone  25 mg Oral QHS   Continuous Infusions:   PRN Meds: acetaminophen **OR** acetaminophen, hydrALAZINE, hydrALAZINE, loperamide, LORazepam **OR** LORazepam, ondansetron **OR** ondansetron (ZOFRAN) IV, witch hazel-glycerin  Time spent: 35 minutes  Author: Val Riles. MD [facility] 07/14/2022 1:41 PM  To reach On-call, see care teams to locate the attending and reach out to them via [email or web address]. If 7PM-7AM, please contact night-coverage If you still have difficulty reaching the attending provider, please page the [facility] (Director on Call) for [facility] on amion for assistance.

## 2022-07-15 ENCOUNTER — Inpatient Hospital Stay: Payer: Medicare HMO

## 2022-07-15 DIAGNOSIS — N179 Acute kidney failure, unspecified: Secondary | ICD-10-CM | POA: Diagnosis not present

## 2022-07-15 LAB — GLUCOSE, CAPILLARY
Glucose-Capillary: 236 mg/dL — ABNORMAL HIGH (ref 70–99)
Glucose-Capillary: 173 mg/dL — ABNORMAL HIGH (ref 70–99)
Glucose-Capillary: 205 mg/dL — ABNORMAL HIGH (ref 70–99)
Glucose-Capillary: 229 mg/dL — ABNORMAL HIGH (ref 70–99)
Glucose-Capillary: 259 mg/dL — ABNORMAL HIGH (ref 70–99)

## 2022-07-15 LAB — CBC
HCT: 34.1 % — ABNORMAL LOW (ref 39.0–52.0)
Hemoglobin: 11.1 g/dL — ABNORMAL LOW (ref 13.0–17.0)
MCH: 32.9 pg (ref 26.0–34.0)
MCHC: 32.6 g/dL (ref 30.0–36.0)
MCV: 101.2 fL — ABNORMAL HIGH (ref 80.0–100.0)
Platelets: 189 10*3/uL (ref 150–400)
RBC: 3.37 MIL/uL — ABNORMAL LOW (ref 4.22–5.81)
RDW: 12.7 % (ref 11.5–15.5)
WBC: 5.8 10*3/uL (ref 4.0–10.5)
nRBC: 0 % (ref 0.0–0.2)

## 2022-07-15 LAB — BASIC METABOLIC PANEL
Anion gap: 4 — ABNORMAL LOW (ref 5–15)
BUN: 40 mg/dL — ABNORMAL HIGH (ref 8–23)
CO2: 23 mmol/L (ref 22–32)
Calcium: 9 mg/dL (ref 8.9–10.3)
Chloride: 110 mmol/L (ref 98–111)
Creatinine, Ser: 1.34 mg/dL — ABNORMAL HIGH (ref 0.61–1.24)
GFR, Estimated: 54 mL/min — ABNORMAL LOW (ref >60)
Glucose, Bld: 131 mg/dL — ABNORMAL HIGH (ref 70–99)
Potassium: 5.1 mmol/L (ref 3.5–5.1)
Sodium: 137 mmol/L (ref 135–145)

## 2022-07-15 NOTE — Progress Notes (Signed)
[facility] Progress Note  Patient: Mitchell Price    WCB:[id number]  DOA: 06/17/2022     Date of Service: the patient was seen and examined on 07/15/2022  Chief Complaint  Patient presents with   Fall   Brief hospital course: Mitchell Price is a 77 y.o. male with a PMH significant for type  2 diabetes mellitus, essential hypertension, history of DVT on Eliquis, history of PUD, diverticulosis, type diabetes mellitus and dyslipidemia.   They presented from ED to the ED on 06/17/2022 with intractable diarrhea x 1 days. He had been boarding in the ED awaiting long-term placement when he developed intractable diarrhea with incontinence. The dehydration that resulted contributed to an AKI.    In the ED, it was found that they had BP of 133/84 with temperature of 98, heart rate of 83 and respiratory rate of 20 with pulse oximetry of 96% on room air.  Significant findings included:  BUN of 41 and creatinine 1.52 compared to 22/1.11 on 1/10 and blood glucose of 237.  CBC showed hemoglobin 12.3 and hematocrit 37.8 with WBC of 5.5 and platelets 249.  Previous H&H were close. EKG On 1/10 showed normal sinus rhythm with a rate of 81 with left axis deviation and prolonged QT interval with QTc of 480 MS. Imaging: Most recent two-view chest x-ray on 1/10 showed linear scarring or atelectasis at the right lung base.  Abdominal pelvic CT scan this morning revealed bilateral hyperdense renal lesions that may be hemorrhagic/proteinaceous cysts with no acute abnormality in the abdomen or pelvis.  It showed cholelithiasis and body wall edema.  There was a recommendation for further follow-up with nonemergent MRI of the abdomen with and without contrast to rule out malignancy.   They were initially treated with 4 mg of IV Zofran and 1 L bolus of IV lactated Ringer and stool pathogen panel was ordered. Patient was not agreeable to sample collection.    Patient was admitted to medicine service for further  workup and management of diarrhea as outlined in detail below.   Assessment and Plan: Principal Problem:   AKI (acute kidney injury) ([place]) Active Problems:   Memory changes   Intractable diarrhea   Uncontrolled type 2 diabetes mellitus with hyperglycemia, with long-term current use of insulin (HCC)   Dyslipidemia   History of DVT (deep vein thrombosis)   Essential hypertension   Diabetic neuropathy (HCC)   # AKI, prerenal due to dehydration secondary to diarrhea Cr 1.52--1.3--1.14--1.35--1.34  gradually improving 2/4 decreased IVF NS 50 ml/hr, discontinued IV fluid on 2/6 RN was advised to encourage for oral fluid intake  2/6 urinary retention, Foley catheter was inserted, 1200 ml urine was collected. started Flomax - repeat BMP am   # Intractable diarrhea- Resolved  His abdominal exam is benign and abdominal CT scan showed no acute abdominal or pelvic abnormalities. Stool sample was not sent for GIP, diarrhea resolved. continue to monitor hydration status. S/p Florastor for 5 days  # Dementia- patient is oriented to self only and poorly redirected. His son stated that he is at his baseline. Will need placement - TOC engaged for placement - 1:1 sitter - delirium precautions - seroquel  - PRN haldol, ativan   # Bed bugs- found after admission. Room/fabrics cleaned/replaced   # Uncontrolled type 2 diabetes mellitus with hyperglycemia, with long-term current use of insulin ([place]) On 2/2 patient had an episode of hypoglycemia, discontinued Semglee and discontinued home oral antidiabetic medications.   Follow hypoglycemia protocol  Continue NovoLog sliding scale, continue diabetic diet. Monitor FSBG. 2/6 started Semglee 15 units nightly, consulted diabetic coordinator   Dyslipidemia - continue statin therapy.   Essential hypertension-  -  hold off lisinopril given his acute kidney injury. 2/2 started Lopressor with holding parameters. Use hydralazine po or IV as per BP  parameters  History of DVT (deep vein thrombosis) - continue Eliquis    Diabetic neuropathy - continue Neurontin.  Insomnia, started melatonin 5 mg p.o. nightly and trazodone 25 mg p.o. nightly   Body mass index is 21.96 kg/m.  Interventions:   On admission patient had severe dehydration and low appetite, developed AKI due to significant diarrhea.   On 2/1 patient continued to have diarrhea, poor appetite and patient required IV fluid.   On 2/2 diarrhea resolved, AKI resolved, appetite is improving.  Decreased IV fluid NS 50 mL/h for gentle hydration, patient has significant dementia and may not drink enough water to keep himself hydrated. 2/5 d/c'd IVF   Diet: Carb modified diet DVT Prophylaxis: Therapeutic Anticoagulation with Eliquis    Advance goals of care discussion: Full code  Family Communication: family was not present at bedside, at the time of interview.  The pt provided permission to discuss medical plan with the family. Opportunity was given to ask question and all questions were answered satisfactorily.  Patient has significant dementia AAO x 1  Disposition:  Pt is from Home, admitted with worsening of dementia, awaiting in the ED for placement, developed diarrhea, dehydration and AKI so admitted as an inpatient for IV hydration.  AKI resolved, appetite improved, diarrhea resolved, had urinary retention, Foley catheter was inserted on 2/6.  Patient is clinically stable, medically optimized to discharge, awaiting placement. Discharge to LTC placement, when bed available..  Subjective: No significant events overnight, patient was sitting on the bed comfortably, denies any active issues.  Eating well, no diarrhea.  Physical Exam: General: NAD, lying comfortably in the bed  Appear in no distress, affect appropriate Eyes: PERRLA ENT: Oral Mucosa Clear, moist  Neck: no JVD,  Cardiovascular: S1 and S2 Present, no Murmur,  Respiratory: good respiratory effort,  Bilateral Air entry equal and Decreased, no Crackles, no wheezes Abdomen: Bowel Sound present, Soft and no tenderness,  Skin: no rashes Extremities: no Pedal edema, no calf tenderness Neurologic: without any new focal findings, AAO x 1 Gait not checked due to patient safety concerns  Vitals:   07/14/22 2109 07/15/22 0035 07/15/22 0500 07/15/22 0840  BP: 116/62 97/61 114/77 (!) 110/59  Pulse: 83 74 78 78  Resp: 20 17 16 18   Temp: 98.6 F (37 C) 98 F (36.7 C) 98.7 F (37.1 C) 97.6 F (36.4 C)  TempSrc: Oral Oral Oral Oral  SpO2: 94% 97% 98% 100%  Weight:      Height:        Intake/Output Summary (Last 24 hours) at 07/15/2022 1223 Last data filed at 07/15/2022 0900 Gross per 24 hour  Intake 710 ml  Output 3450 ml  Net -2740 ml   Filed Weights   06/17/22 1549 07/08/22 1841  Weight: 86 kg 84 kg    Data Reviewed: I have personally reviewed and interpreted daily labs, tele strips, imagings as discussed above. I reviewed all nursing notes, pharmacy notes, vitals, pertinent old records I have discussed plan of care as described above with RN and patient/family.  CBC: Recent Labs  Lab 07/11/22 0357 07/12/22 0516 07/13/22 0351 07/14/22 0614 07/15/22 0547  WBC 4.4 5.3 5.2 4.9 5.8  HGB 11.7* 11.6* 10.7* 12.7* 11.1*  HCT 35.8* 35.5* 32.2* 38.8* 34.1*  MCV 101.7* 101.7* 101.6* 102.4* 101.2*  PLT 208 204 183 206 174   Basic Metabolic Panel: Recent Labs  Lab 07/10/22 0533 07/11/22 0609 07/12/22 0516 07/13/22 0351 07/14/22 0614 07/15/22 0547  NA 141 137 138 138 136 137  K 4.1 4.1 4.8 4.6 4.8 5.1  CL 109 107 111 110 105 110  CO2 23 21* 20* 21* 23 23  GLUCOSE 68* 100* 304* 150* 187* 131*  BUN 23 36* 37* 37* 39* 40*  CREATININE 1.14 1.41* 1.33* 1.35* 1.45* 1.34*  CALCIUM 9.1 8.8* 9.0 8.8* 9.5 9.0  MG 2.0 2.4 2.2 2.0  --   --   PHOS 4.9* 5.0* 3.2 3.2  --   --     Studies: CT HEAD WO CONTRAST (5MM)  Result Date: 07/15/2022 CLINICAL DATA:  Head trauma. EXAM: CT HEAD  WITHOUT CONTRAST TECHNIQUE: Contiguous axial images were obtained from the base of the skull through the vertex without intravenous contrast. RADIATION DOSE REDUCTION: This exam was performed according to the departmental dose-optimization program which includes automated exposure control, adjustment of the mA and/or kV according to patient size and/or use of iterative reconstruction technique. COMPARISON:  Head CT 06/23/2022. FINDINGS: Brain: No acute hemorrhage, mass effect or midline shift. Stable chronic small-vessel disease. Gray-white differentiation is otherwise preserved. No hydrocephalus. No extra-axial collection. Basilar cisterns are patent. Vascular: No hyperdense vessel or unexpected calcification. Skull: No calvarial fracture or suspicious bone lesion. Skull base is unremarkable. Sinuses/Orbits: Partial opacification of the ethmoid air cells. Mastoids are well aerated. Orbits are unremarkable. Other: No scalp hematoma. IMPRESSION: 1. No acute intracranial injury or calvarial fracture. 2. Stable chronic small-vessel disease. Electronically Signed   By: Emmit Alexanders M.D.   On: 07/15/2022 08:55    Scheduled Meds:  apixaban  5 mg Oral BID   atorvastatin  40 mg Oral Daily   feeding supplement (GLUCERNA SHAKE)  237 mL Oral TID BM   ferrous sulfate  325 mg Oral Q breakfast   gabapentin  300 mg Oral Daily   insulin aspart  0-15 Units Subcutaneous TID AC & HS   insulin aspart  3 Units Subcutaneous TID WC   insulin glargine-yfgn  15 Units Subcutaneous QHS   melatonin  5 mg Oral QHS   metoprolol tartrate  25 mg Oral BID   QUEtiapine  25 mg Oral BID   tamsulosin  0.4 mg Oral QPC supper   traZODone  25 mg Oral QHS   Continuous Infusions:   PRN Meds: acetaminophen **OR** acetaminophen, hydrALAZINE, hydrALAZINE, loperamide, LORazepam **OR** LORazepam, ondansetron **OR** ondansetron (ZOFRAN) IV, witch hazel-glycerin  Time spent: 35 minutes  Author: Val Riles. MD [facility] 07/15/2022 12:23 PM  To reach On-call, see care teams to locate the attending and reach out to them via [email or web address]. If 7PM-7AM, please contact night-coverage If you still have difficulty reaching the attending provider, please page the [facility] (Director on Call) for [facility] on amion for assistance.

## 2022-07-15 NOTE — Progress Notes (Signed)
       CROSS COVER NOTE  NAME: Mitchell Price MRN: [id number] DOB : May 15, 1946 ATTENDING PHYSICIAN: Val Riles, MD    Date of Service   07/15/2022   HPI/Events of Note   Message received from nursing staff reporting Mitchell Price leaned forward while using the bathroom and hit his head on the railing. No signs of injury and patient is not reporting pain. Impact was loud enough for staff to hear and Mitchell Price is on Eliquis. He is confused at baseline, no change from neurological baseline.  Interventions   Assessment/Plan:  CT Head      To reach the provider On-Call:   7AM- 7PM see care teams to locate the attending and reach out to them via [email or web address] Password: [email or web address] 7PM-7AM contact night-coverage If you still have difficulty reaching the appropriate provider, please page the [facility] (Director on Call) for [facility] on amion for assistance  This document was prepared using Systems analyst and may include unintentional dictation errors.  Neomia Glass DNP, MBA, FNP-BC, PMHNP-BC Nurse Practitioner [facility] Pager [phone]

## 2022-07-15 NOTE — TOC Progression Note (Signed)
Transition of Care [facility]) - Progression Note    Patient Details  Name: [name] MRN: [id number] Date of Birth: 11/14/1945  [facility] CM/SW Contact  Beverly Sessions, RN Phone Number: 07/15/2022, 10:19 AM  Clinical Narrative:    Message left for son [name] to confirm he went to DSS yesterday to complete special assistance application   Expected Discharge Plan: Assisted Living Barriers to Discharge: ED Unsafe disposition  Expected Discharge Plan and Services   Discharge Planning Services: CM Consult   Living arrangements for the past 2 months: Single Family Home                 DME Arranged: N/A DME Agency: NA                   Social Determinants of Health (SDOH) Interventions SDOH Screenings   Tobacco Use: High Risk (07/08/2022)    Readmission Risk Interventions     No data to display

## 2022-07-16 DIAGNOSIS — N179 Acute kidney failure, unspecified: Secondary | ICD-10-CM | POA: Diagnosis not present

## 2022-07-16 LAB — CBC
HCT: 34.8 % — ABNORMAL LOW (ref 39.0–52.0)
Hemoglobin: 11.5 g/dL — ABNORMAL LOW (ref 13.0–17.0)
MCH: 33.4 pg (ref 26.0–34.0)
MCHC: 33 g/dL (ref 30.0–36.0)
MCV: 101.2 fL — ABNORMAL HIGH (ref 80.0–100.0)
Platelets: 188 10*3/uL (ref 150–400)
RBC: 3.44 MIL/uL — ABNORMAL LOW (ref 4.22–5.81)
RDW: 12.8 % (ref 11.5–15.5)
WBC: 4.9 10*3/uL (ref 4.0–10.5)
nRBC: 0 % (ref 0.0–0.2)

## 2022-07-16 LAB — GLUCOSE, CAPILLARY
Glucose-Capillary: 118 mg/dL — ABNORMAL HIGH (ref 70–99)
Glucose-Capillary: 315 mg/dL — ABNORMAL HIGH (ref 70–99)
Glucose-Capillary: 286 mg/dL — ABNORMAL HIGH (ref 70–99)

## 2022-07-16 LAB — BASIC METABOLIC PANEL
Anion gap: 8 (ref 5–15)
BUN: 39 mg/dL — ABNORMAL HIGH (ref 8–23)
CO2: 22 mmol/L (ref 22–32)
Calcium: 8.9 mg/dL (ref 8.9–10.3)
Chloride: 105 mmol/L (ref 98–111)
Creatinine, Ser: 1.35 mg/dL — ABNORMAL HIGH (ref 0.61–1.24)
GFR, Estimated: 54 mL/min — ABNORMAL LOW (ref >60)
Glucose, Bld: 152 mg/dL — ABNORMAL HIGH (ref 70–99)
Potassium: 4.4 mmol/L (ref 3.5–5.1)
Sodium: 135 mmol/L (ref 135–145)

## 2022-07-16 MED ORDER — NICOTINE 14 MG/24HR TD PT24: 14.0000 mg | MEDICATED_PATCH | Freq: Every day | TRANSDERMAL | Status: DC

## 2022-07-16 MED ORDER — CHLORHEXIDINE GLUCONATE CLOTH 2 % EX PADS
6.0000 | MEDICATED_PAD | Freq: Every day | CUTANEOUS | Status: DC
  Administered 2022-07-17 – 2022-07-23 (×5): 6 via TOPICAL

## 2022-07-16 MED ORDER — NICOTINE 21 MG/24HR TD PT24
21.0000 mg | MEDICATED_PATCH | Freq: Every day | TRANSDERMAL | Status: DC
  Administered 2022-07-16 – 2022-07-19 (×3): 21 mg via TRANSDERMAL
  Filled 2022-07-16 (×6): qty 1

## 2022-07-16 MED ORDER — NICOTINE 7 MG/24HR TD PT24: 7.0000 mg | MEDICATED_PATCH | Freq: Every day | TRANSDERMAL | Status: DC

## 2022-07-16 NOTE — Progress Notes (Signed)
[facility] Progress Note  Patient: Mitchell Price    VOZ:[id number]  DOA: 06/17/2022     Date of Service: the patient was seen and examined on 07/16/2022  Chief Complaint  Patient presents with   Fall   Brief hospital course: Mitchell Price is a 77 y.o. male with a PMH significant for type  2 diabetes mellitus, essential hypertension, history of DVT on Eliquis, history of PUD, diverticulosis, type diabetes mellitus and dyslipidemia.   They presented from ED to the ED on 06/17/2022 with intractable diarrhea x 1 days. He had been boarding in the ED awaiting long-term placement when he developed intractable diarrhea with incontinence. The dehydration that resulted contributed to an AKI.    In the ED, it was found that they had BP of 133/84 with temperature of 98, heart rate of 83 and respiratory rate of 20 with pulse oximetry of 96% on room air.  Significant findings included:  BUN of 41 and creatinine 1.52 compared to 22/1.11 on 1/10 and blood glucose of 237.  CBC showed hemoglobin 12.3 and hematocrit 37.8 with WBC of 5.5 and platelets 249.  Previous H&H were close. EKG On 1/10 showed normal sinus rhythm with a rate of 81 with left axis deviation and prolonged QT interval with QTc of 480 MS. Imaging: Most recent two-view chest x-ray on 1/10 showed linear scarring or atelectasis at the right lung base.  Abdominal pelvic CT scan this morning revealed bilateral hyperdense renal lesions that may be hemorrhagic/proteinaceous cysts with no acute abnormality in the abdomen or pelvis.  It showed cholelithiasis and body wall edema.  There was a recommendation for further follow-up with nonemergent MRI of the abdomen with and without contrast to rule out malignancy.   They were initially treated with 4 mg of IV Zofran and 1 L bolus of IV lactated Ringer and stool pathogen panel was ordered. Patient was not agreeable to sample collection.    Patient was admitted to medicine service for further  workup and management of diarrhea as outlined in detail below.   Assessment and Plan: Principal Problem:   AKI (acute kidney injury) ([place]) Active Problems:   Memory changes   Intractable diarrhea   Uncontrolled type 2 diabetes mellitus with hyperglycemia, with long-term current use of insulin (HCC)   Dyslipidemia   History of DVT (deep vein thrombosis)   Essential hypertension   Diabetic neuropathy (HCC)   # AKI, prerenal due to dehydration secondary to diarrhea Cr 1.52--1.3--1.14--1.35  gradually improving 2/4 decreased IVF NS 50 ml/hr, discontinued IV fluid on 2/6 RN was advised to encourage for oral fluid intake  2/6 urinary retention, Foley catheter was inserted, 1200 ml urine was collected.  Keep Foley for 1 week and then DC for voiding trial on 2/14 started Flomax. - repeat BMP am   # Intractable diarrhea- Resolved  His abdominal exam is benign and abdominal CT scan showed no acute abdominal or pelvic abnormalities. Stool sample was not sent for GIP, diarrhea resolved. continue to monitor hydration status. S/p Florastor for 5 days  # Dementia- patient is oriented to self only and poorly redirected. His son stated that he is at his baseline. Will need placement - TOC engaged for placement - 1:1 sitter - delirium precautions - seroquel  - PRN haldol, ativan   # Bed bugs- found after admission. Room/fabrics cleaned/replaced   # Uncontrolled type 2 diabetes mellitus with hyperglycemia, with long-term current use of insulin ([place]) On 2/2 patient had an episode of  hypoglycemia, discontinued Semglee and discontinued home oral antidiabetic medications.   Follow hypoglycemia protocol Continue NovoLog sliding scale, continue diabetic diet. Monitor FSBG. 2/6 started Semglee 15 units nightly, consulted diabetic coordinator   Dyslipidemia - continue statin therapy.   Essential hypertension-  -  hold off lisinopril given his acute kidney injury. 2/2 started Lopressor with  holding parameters. Use hydralazine po or IV as per BP parameters  History of DVT (deep vein thrombosis) - continue Eliquis    Diabetic neuropathy - continue Neurontin.  Insomnia, started melatonin 5 mg p.o. nightly and trazodone 25 mg p.o. nightly   Body mass index is 21.96 kg/m.  Interventions:   On admission patient had severe dehydration and low appetite, developed AKI due to significant diarrhea.   On 2/1 patient continued to have diarrhea, poor appetite and patient required IV fluid.   On 2/2 diarrhea resolved, AKI resolved, appetite is improving.  Decreased IV fluid NS 50 mL/h for gentle hydration, patient has significant dementia and may not drink enough water to keep himself hydrated. 2/5 d/c'd IVF   Diet: Carb modified diet DVT Prophylaxis: Therapeutic Anticoagulation with Eliquis    Advance goals of care discussion: Full code  Family Communication: family was not present at bedside, at the time of interview.  The pt provided permission to discuss medical plan with the family. Opportunity was given to ask question and all questions were answered satisfactorily.  Patient has significant dementia AAO x 1  Disposition:  Pt is from Home, admitted with worsening of dementia, awaiting in the ED for placement, developed diarrhea, dehydration and AKI so admitted as an inpatient for IV hydration.  AKI resolved, appetite improved, diarrhea resolved, had urinary retention, Foley catheter was inserted on 2/6.  Patient is clinically stable, medically optimized to discharge, awaiting placement. Discharge to LTC placement, when bed available..  Subjective: No significant events overnight, as per sitter patient did not sleep the whole night so he fell asleep in the morning, I did not wake him up.  Patient was resting company without any distress. RN was advised to keep monitoring and update.   Physical Exam: General: NAD, lying comfortably in the bed  Appear in no distress, affect  appropriate Eyes: sleepy ENT: Oral Mucosa Clear, moist  Neck: no JVD,  Cardiovascular: S1 and S2 Present, no Murmur,  Respiratory: good respiratory effort, Bilateral Air entry equal and Decreased, no Crackles, no wheezes Abdomen: Bowel Sound present, Soft and no tenderness,  Skin: no rashes Extremities: no Pedal edema, no calf tenderness Neurologic: without any new focal findings, AAO x 1 Gait not checked due to patient safety concerns  Vitals:   07/15/22 1959 07/16/22 0500 07/16/22 1051 07/16/22 1113  BP: 137/75 108/68 (!) 85/57 126/73  Pulse: 81 80 81   Resp: 18 18 18    Temp: 98.4 F (36.9 C) 98.6 F (37 C) 98.4 F (36.9 C)   TempSrc: Oral Oral Oral   SpO2: 99% 99% 100%   Weight:      Height:        Intake/Output Summary (Last 24 hours) at 07/16/2022 1400 Last data filed at 07/16/2022 1108 Gross per 24 hour  Intake 720 ml  Output 3875 ml  Net -3155 ml   Filed Weights   06/17/22 1549 07/08/22 1841  Weight: 86 kg 84 kg    Data Reviewed: I have personally reviewed and interpreted daily labs, tele strips, imagings as discussed above. I reviewed all nursing notes, pharmacy notes, vitals, pertinent old records I  have discussed plan of care as described above with RN and patient/family.  CBC: Recent Labs  Lab 07/12/22 0516 07/13/22 0351 07/14/22 0614 07/15/22 0547 07/16/22 0559  WBC 5.3 5.2 4.9 5.8 4.9  HGB 11.6* 10.7* 12.7* 11.1* 11.5*  HCT 35.5* 32.2* 38.8* 34.1* 34.8*  MCV 101.7* 101.6* 102.4* 101.2* 101.2*  PLT 204 183 206 189 353   Basic Metabolic Panel: Recent Labs  Lab 07/10/22 0533 07/11/22 0609 07/12/22 0516 07/13/22 0351 07/14/22 0614 07/15/22 0547 07/16/22 0559  NA 141 137 138 138 136 137 135  K 4.1 4.1 4.8 4.6 4.8 5.1 4.4  CL 109 107 111 110 105 110 105  CO2 23 21* 20* 21* 23 23 22   GLUCOSE 68* 100* 304* 150* 187* 131* 152*  BUN 23 36* 37* 37* 39* 40* 39*  CREATININE 1.14 1.41* 1.33* 1.35* 1.45* 1.34* 1.35*  CALCIUM 9.1 8.8* 9.0 8.8* 9.5  9.0 8.9  MG 2.0 2.4 2.2 2.0  --   --   --   PHOS 4.9* 5.0* 3.2 3.2  --   --   --     Studies: No results found.  Scheduled Meds:  apixaban  5 mg Oral BID   atorvastatin  40 mg Oral Daily   feeding supplement (GLUCERNA SHAKE)  237 mL Oral TID BM   ferrous sulfate  325 mg Oral Q breakfast   gabapentin  300 mg Oral Daily   insulin aspart  0-15 Units Subcutaneous TID AC & HS   insulin aspart  3 Units Subcutaneous TID WC   insulin glargine-yfgn  15 Units Subcutaneous QHS   melatonin  5 mg Oral QHS   metoprolol tartrate  25 mg Oral BID   nicotine  21 mg Transdermal Daily   Followed by   Derrill Memo ON 07/30/2022] nicotine  14 mg Transdermal Daily   Followed by   Derrill Memo ON 08/06/2022] nicotine  7 mg Transdermal Daily   QUEtiapine  25 mg Oral BID   tamsulosin  0.4 mg Oral QPC supper   traZODone  25 mg Oral QHS   Continuous Infusions:   PRN Meds: acetaminophen **OR** acetaminophen, hydrALAZINE, hydrALAZINE, loperamide, LORazepam **OR** LORazepam, ondansetron **OR** ondansetron (ZOFRAN) IV, witch hazel-glycerin  Time spent: 35 minutes  Author: Val Riles. MD [facility] 07/16/2022 2:00 PM  To reach On-call, see care teams to locate the attending and reach out to them via [email or web address]. If 7PM-7AM, please contact night-coverage If you still have difficulty reaching the attending provider, please page the [facility] (Director on Call) for [facility] on amion for assistance.

## 2022-07-16 NOTE — TOC Progression Note (Signed)
Transition of Care [facility]) - Progression Note    Patient Details  Name: Mitchell Price MRN: [id number] Date of Birth: 08-24-1945  Transition of Care [facility]) CM/SW Contact  Beverly Sessions, RN Phone Number: 07/16/2022, 11:51 AM  Clinical Narrative:     Authorized representative form secure emailed to Geronimo Running with DSS   Expected Discharge Plan: Assisted Living Barriers to Discharge: ED Unsafe disposition  Expected Discharge Plan and Services   Discharge Planning Services: CM Consult   Living arrangements for the past 2 months: Single Family Home                 DME Arranged: N/A DME Agency: NA                   Social Determinants of Health (SDOH) Interventions SDOH Screenings   Tobacco Use: High Risk (07/08/2022)    Readmission Risk Interventions     No data to display

## 2022-07-17 DIAGNOSIS — N179 Acute kidney failure, unspecified: Secondary | ICD-10-CM | POA: Diagnosis not present

## 2022-07-17 LAB — BASIC METABOLIC PANEL
Anion gap: 7 (ref 5–15)
BUN: 43 mg/dL — ABNORMAL HIGH (ref 8–23)
CO2: 23 mmol/L (ref 22–32)
Calcium: 8.9 mg/dL (ref 8.9–10.3)
Chloride: 106 mmol/L (ref 98–111)
Creatinine, Ser: 1.5 mg/dL — ABNORMAL HIGH (ref 0.61–1.24)
GFR, Estimated: 47 mL/min — ABNORMAL LOW (ref >60)
Glucose, Bld: 290 mg/dL — ABNORMAL HIGH (ref 70–99)
Potassium: 4.3 mmol/L (ref 3.5–5.1)
Sodium: 136 mmol/L (ref 135–145)

## 2022-07-17 LAB — GLUCOSE, CAPILLARY
Glucose-Capillary: 235 mg/dL — ABNORMAL HIGH (ref 70–99)
Glucose-Capillary: 258 mg/dL — ABNORMAL HIGH (ref 70–99)
Glucose-Capillary: 234 mg/dL — ABNORMAL HIGH (ref 70–99)
Glucose-Capillary: 264 mg/dL — ABNORMAL HIGH (ref 70–99)

## 2022-07-17 LAB — CBC
HCT: 36.1 % — ABNORMAL LOW (ref 39.0–52.0)
Hemoglobin: 11.9 g/dL — ABNORMAL LOW (ref 13.0–17.0)
MCH: 32.9 pg (ref 26.0–34.0)
MCHC: 33 g/dL (ref 30.0–36.0)
MCV: 99.7 fL (ref 80.0–100.0)
Platelets: 179 10*3/uL (ref 150–400)
RBC: 3.62 MIL/uL — ABNORMAL LOW (ref 4.22–5.81)
RDW: 12.6 % (ref 11.5–15.5)
WBC: 5 10*3/uL (ref 4.0–10.5)
nRBC: 0 % (ref 0.0–0.2)

## 2022-07-17 MED ORDER — SODIUM CHLORIDE 0.9 % IV SOLN: INTRAVENOUS | Status: AC

## 2022-07-17 MED ORDER — INSULIN ASPART 100 UNIT/ML IJ SOLN
4.0000 [IU] | Freq: Three times a day (TID) | INTRAMUSCULAR | Status: DC
  Administered 2022-07-17 – 2022-07-18 (×3): 4 [IU] via SUBCUTANEOUS
  Filled 2022-07-17 (×3): qty 1

## 2022-07-17 MED ORDER — INSULIN ASPART 100 UNIT/ML IJ SOLN
0.0000 [IU] | Freq: Three times a day (TID) | INTRAMUSCULAR | Status: DC
  Administered 2022-07-17: 5 [IU] via SUBCUTANEOUS
  Administered 2022-07-18 – 2022-07-19 (×4): 8 [IU] via SUBCUTANEOUS
  Administered 2022-07-20: 2 [IU] via SUBCUTANEOUS
  Administered 2022-07-20: 8 [IU] via SUBCUTANEOUS
  Administered 2022-07-20: 11 [IU] via SUBCUTANEOUS
  Administered 2022-07-21: 8 [IU] via SUBCUTANEOUS
  Administered 2022-07-21 – 2022-07-22 (×2): 11 [IU] via SUBCUTANEOUS
  Administered 2022-07-22 – 2022-07-23 (×2): 3 [IU] via SUBCUTANEOUS
  Filled 2022-07-17 (×14): qty 1

## 2022-07-17 MED ORDER — INSULIN ASPART 100 UNIT/ML IJ SOLN
0.0000 [IU] | Freq: Every day | INTRAMUSCULAR | Status: DC
  Administered 2022-07-17 – 2022-07-21 (×2): 3 [IU] via SUBCUTANEOUS
  Filled 2022-07-17 (×3): qty 1

## 2022-07-17 MED ORDER — INSULIN GLARGINE-YFGN 100 UNIT/ML ~~LOC~~ SOLN
18.0000 [IU] | Freq: Every day | SUBCUTANEOUS | Status: DC
  Administered 2022-07-17: 18 [IU] via SUBCUTANEOUS
  Filled 2022-07-17 (×2): qty 0.18

## 2022-07-17 NOTE — NC FL2 (Signed)
[place] LEVEL OF CARE FORM     IDENTIFICATION  Patient Name: Mitchell Price Birthdate: 03/08/1946 Sex: male Admission Date (Current Location): 06/17/2022  [facility] and [place] Number:  Engineering geologist and Address:  [facility], [address] [place], [place] [address]      Provider Number: [id number]  Attending Physician Name and Address:  Val Riles, MD  Relative Name and Phone Number:  [name]- son- [phone]    Current Level of Care: Hospital Recommended Level of Care: Domiciliary Prior Approval Number:    Date Approved/Denied:   PASRR Number:    Discharge Plan: Domiciliary    Current Diagnoses: Patient Active Problem List   Diagnosis Date Noted   Altered mental status 07/09/2022   Fall 07/09/2022   Hyperglycemia 07/09/2022   Diarrhea of infectious origin 07/09/2022   Intractable diarrhea 07/08/2022   Uncontrolled type 2 diabetes mellitus with hyperglycemia, with long-term current use of insulin ([place]) 07/08/2022   Diabetic neuropathy ([place]) 07/08/2022   Dyslipidemia 07/08/2022   History of DVT (deep vein thrombosis) 07/08/2022   Essential hypertension 07/08/2022   Memory changes 06/19/2022   CKD (chronic kidney disease) stage 3, GFR 30-59 ml/min ([place]) 09/12/2019   Melena 09/12/2019   Acute blood loss anemia 09/12/2019   History of hemorrhoids 09/12/2019   Type 2 diabetes mellitus with stage 3 chronic kidney disease ([place]) 09/12/2019   AKI (acute kidney injury) ([place]) 09/12/2019   PUD (peptic ulcer disease) 09/12/2019   HTN (hypertension) 09/12/2019   Upper GI bleed 09/12/2019   Hyperglycemia due to type 2 diabetes mellitus ([place]) 09/12/2019   BPH (benign prostatic hyperplasia) 01/17/2019   HLD (hyperlipidemia) 01/17/2019   Multiple thyroid nodules 07/18/2017   Grade III hemorrhoids    Acute GI bleeding 07/13/2017   GIB (gastrointestinal bleeding) 07/12/2017   Acidosis, hyperchloremic 12/13/2015    Erectile dysfunction due to arterial insufficiency 04/13/2014   Diverticulosis 08/25/2013   Internal hemorrhoid, bleeding 08/25/2013   Diabetes type 2, uncontrolled 08/22/2013   Renal mass 08/21/2013   Lens replaced by other means 01/26/2013   Status post cataract extraction 01/26/2013   Anemia 04/01/2012    Orientation RESPIRATION BLADDER Height & Weight     Self, Place  Normal Continent Weight: 84 kg Height:  6' 5"$  (195.6 cm)  BEHAVIORAL SYMPTOMS/MOOD NEUROLOGICAL BOWEL NUTRITION STATUS  Wanderer   Continent Diet (regular)  AMBULATORY STATUS COMMUNICATION OF NEEDS Skin   Independent Verbally Normal                       Personal Care Assistance Level of Assistance  Bathing, Feeding, Dressing Bathing Assistance: Limited assistance Feeding assistance: Limited assistance Dressing Assistance: Independent     Functional Limitations Info  Sight, Hearing, Speech Sight Info: Adequate Hearing Info: Adequate Speech Info: Adequate    SPECIAL CARE FACTORS FREQUENCY                       Contractures Contractures Info: Not present    Additional Factors Info  Code Status, Allergies Code Status Info: full Allergies Info: NKDA           Current Medications (07/17/2022):  This is the current hospital active medication list Current Facility-Administered Medications  Medication Dose Route Frequency Provider Last Rate Last Admin   0.9 %  sodium chloride infusion   Intravenous Continuous Val Riles, MD 75 mL/hr at 07/17/22 0932 New Bag at 07/17/22 [phone]  acetaminophen (TYLENOL) tablet 650 mg  650 mg Oral Q6H PRN Mansy, Jan A, MD   650 mg at 07/16/22 2220   Or   acetaminophen (TYLENOL) suppository 650 mg  650 mg Rectal Q6H PRN Mansy, Arvella Merles, MD       apixaban [facility] [place]) tablet 5 mg  5 mg Oral BID Vladimir Crofts, MD   5 mg at 07/17/22 0806   atorvastatin (LIPITOR) tablet 40 mg  40 mg Oral Daily Vladimir Crofts, MD   40 mg at 07/17/22 [id number]   Chlorhexidine Gluconate Cloth  2 % PADS 6 each  6 each Topical Daily Val Riles, MD   6 each at 07/17/22 0807   feeding supplement (GLUCERNA SHAKE) (GLUCERNA SHAKE) liquid 237 mL  237 mL Oral TID BM Val Riles, MD   237 mL at 07/17/22 1212   ferrous sulfate tablet 325 mg  325 mg Oral Q breakfast Mansy, Jan A, MD   325 mg at 07/17/22 [id number]   gabapentin (NEURONTIN) capsule 300 mg  300 mg Oral Daily Vladimir Crofts, MD   300 mg at 07/17/22 [id number]   hydrALAZINE (APRESOLINE) injection 10 mg  10 mg Intravenous Q6H PRN Val Riles, MD       hydrALAZINE (APRESOLINE) tablet 50 mg  50 mg Oral Q6H PRN Val Riles, MD       insulin aspart (novoLOG) injection 0-15 Units  0-15 Units Subcutaneous TID WC Val Riles, MD       insulin aspart (novoLOG) injection 0-5 Units  0-5 Units Subcutaneous QHS Val Riles, MD       insulin aspart (novoLOG) injection 4 Units  4 Units Subcutaneous TID WC Val Riles, MD       insulin glargine-yfgn (SEMGLEE) injection 18 Units  18 Units Subcutaneous QHS Val Riles, MD       loperamide (IMODIUM) capsule 2 mg  2 mg Oral PRN Mansy, Jan A, MD       LORazepam (ATIVAN) tablet 2 mg  2 mg Oral Q6H PRN Richarda Osmond, MD   2 mg at 07/14/22 1747   Or   LORazepam (ATIVAN) 2 MG/ML concentrated solution 2 mg  2 mg Oral Q6H PRN Richarda Osmond, MD       melatonin tablet 5 mg  5 mg Oral QHS Val Riles, MD   5 mg at 07/16/22 2220   metoprolol tartrate (LOPRESSOR) tablet 25 mg  25 mg Oral BID Val Riles, MD   25 mg at 07/17/22 [id number]   nicotine (NICODERM CQ - dosed in mg/24 hours) patch 21 mg  21 mg Transdermal Daily Val Riles, MD   21 mg at 07/17/22 [id number]   Followed by   Derrill Memo ON 07/30/2022] nicotine (NICODERM CQ - dosed in mg/24 hours) patch 14 mg  14 mg Transdermal Daily Val Riles, MD       Followed by   Derrill Memo ON 08/06/2022] nicotine (NICODERM CQ - dosed in mg/24 hr) patch 7 mg  7 mg Transdermal Daily Val Riles, MD       ondansetron [facility]) tablet 4 mg  4 mg Oral Q6H PRN Mansy, Jan  A, MD       Or   ondansetron [facility]) injection 4 mg  4 mg Intravenous Q6H PRN Mansy, Jan A, MD       QUEtiapine (SEROQUEL) tablet 25 mg  25 mg Oral BID Richarda Osmond, MD   25 mg at 07/17/22 0806   tamsulosin (FLOMAX) capsule 0.4 mg  0.4 mg Oral QPC supper  Val Riles, MD   0.4 mg at 07/16/22 1738   traZODone (DESYREL) tablet 25 mg  25 mg Oral QHS Val Riles, MD   25 mg at 07/16/22 2221   witch hazel-glycerin (TUCKS) pad   Topical PRN Richarda Osmond, MD         Discharge Medications: Please see discharge summary for a list of discharge medications.  Relevant Imaging Results:  Relevant Lab Results:   Additional Information    Beverly Sessions, RN

## 2022-07-17 NOTE — Progress Notes (Signed)
Inpatient Diabetes Program Recommendations  AACE/ADA: New Consensus Statement on Inpatient Glycemic Control (2015)  Target Ranges:  Prepandial:   less than 140 mg/dL      Peak postprandial:   less than 180 mg/dL (1-2 hours)      Critically ill patients:  140 - 180 mg/dL   Lab Results  Component Value Date   GLUCAP 258 (H) 07/17/2022    Review of Glycemic Control  Latest Reference Range & Units 07/15/22 21:43 07/16/22 10:46 07/16/22 17:26 07/16/22 21:56 07/17/22 07:58 07/17/22 11:57  Glucose-Capillary 70 - 99 mg/dL 259 (H) 118 (H) 315 (H) 286 (H) 235 (H) 258 (H)   Diabetes history: DM 2 Outpatient Diabetes medications: Metformin 1000 mg BID, Actos 30 mg daily, Jardiance 25 mg daily, Amaryl 4 mg daily  Current orders for Inpatient glycemic control: Semglee 15 units q hs, Novolog 0-15 units tid + hs, Novolog 3 units tid with meals Inpatient Diabetes Program Recommendations:    Consider slight increase of Semglee to 18 units daily and increase Novolog meal coverage to 4 units tid with meals. Also consider reduction of night time coverage to HS scale (0-5 units).    Thanks,  Adah Perl, RN, BC-ADM Inpatient Diabetes Coordinator Pager [phone]  (8a-5p)

## 2022-07-17 NOTE — NC FL2 (Signed)
[place] LEVEL OF CARE FORM     IDENTIFICATION  Patient Name: Mitchell Price Birthdate: 08-27-1945 Sex: male Admission Date (Current Location): 06/17/2022  [facility] and [place] Number:  Engineering geologist and Address:  [facility], [address], [place], [place] [address]      Provider Number: [id number]  Attending Physician Name and Address:  Val Riles, MD  Relative Name and Phone Number:  [name]- son- [phone]    Current Level of Care: Hospital Recommended Level of Care: [place], [place] Prior Approval Number:    Date Approved/Denied:   PASRR Number:    Discharge Plan: Other (Comment) (alf/family care home)    Current Diagnoses: Patient Active Problem List   Diagnosis Date Noted   Altered mental status 07/09/2022   Fall 07/09/2022   Hyperglycemia 07/09/2022   Diarrhea of infectious origin 07/09/2022   Intractable diarrhea 07/08/2022   Uncontrolled type 2 diabetes mellitus with hyperglycemia, with long-term current use of insulin ([place]) 07/08/2022   Diabetic neuropathy ([place]) 07/08/2022   Dyslipidemia 07/08/2022   History of DVT (deep vein thrombosis) 07/08/2022   Essential hypertension 07/08/2022   Memory changes 06/19/2022   CKD (chronic kidney disease) stage 3, GFR 30-59 ml/min ([place]) 09/12/2019   Melena 09/12/2019   Acute blood loss anemia 09/12/2019   History of hemorrhoids 09/12/2019   Type 2 diabetes mellitus with stage 3 chronic kidney disease ([place]) 09/12/2019   AKI (acute kidney injury) ([place]) 09/12/2019   PUD (peptic ulcer disease) 09/12/2019   HTN (hypertension) 09/12/2019   Upper GI bleed 09/12/2019   Hyperglycemia due to type 2 diabetes mellitus ([place]) 09/12/2019   BPH (benign prostatic hyperplasia) 01/17/2019   HLD (hyperlipidemia) 01/17/2019   Multiple thyroid nodules 07/18/2017   Grade III hemorrhoids    Acute GI bleeding 07/13/2017   GIB (gastrointestinal  bleeding) 07/12/2017   Acidosis, hyperchloremic 12/13/2015   Erectile dysfunction due to arterial insufficiency 04/13/2014   Diverticulosis 08/25/2013   Internal hemorrhoid, bleeding 08/25/2013   Diabetes type 2, uncontrolled 08/22/2013   Renal mass 08/21/2013   Lens replaced by other means 01/26/2013   Status post cataract extraction 01/26/2013   Anemia 04/01/2012    Orientation RESPIRATION BLADDER Height & Weight     Self, Place  Normal Continent Weight: 84 kg Height:  6' 5"$  (195.6 cm)  BEHAVIORAL SYMPTOMS/MOOD NEUROLOGICAL BOWEL NUTRITION STATUS  Wanderer   Continent Diet (regular)  AMBULATORY STATUS COMMUNICATION OF NEEDS Skin   Independent Verbally Normal                       Personal Care Assistance Level of Assistance  Bathing, Feeding, Dressing Bathing Assistance: Limited assistance Feeding assistance: Limited assistance Dressing Assistance: Independent     Functional Limitations Info  Sight, Hearing, Speech Sight Info: Adequate Hearing Info: Adequate Speech Info: Adequate    SPECIAL CARE FACTORS FREQUENCY                       Contractures Contractures Info: Not present    Additional Factors Info  Code Status, Allergies Code Status Info: full Allergies Info: NKDA           Current Medications (07/17/2022):  This is the current hospital active medication list Current Facility-Administered Medications  Medication Dose Route Frequency Provider Last Rate Last Admin   0.9 %  sodium chloride infusion   Intravenous Continuous Val Riles, MD 75 mL/hr at  07/17/22 0932 New Bag at 07/17/22 0932   acetaminophen (TYLENOL) tablet 650 mg  650 mg Oral Q6H PRN Mansy, Jan A, MD   650 mg at 07/16/22 2220   Or   acetaminophen (TYLENOL) suppository 650 mg  650 mg Rectal Q6H PRN Mansy, Jan A, MD       apixaban [place]) tablet 5 mg  5 mg Oral BID Vladimir Crofts, MD   5 mg at 07/17/22 0806   atorvastatin (LIPITOR) tablet 40 mg  40 mg Oral Daily Vladimir Crofts,  MD   40 mg at 07/17/22 [id number]   Chlorhexidine Gluconate Cloth 2 % PADS 6 each  6 each Topical Daily Val Riles, MD   6 each at 07/17/22 0807   feeding supplement (GLUCERNA SHAKE) (GLUCERNA SHAKE) liquid 237 mL  237 mL Oral TID BM Val Riles, MD   237 mL at 07/17/22 1212   ferrous sulfate tablet 325 mg  325 mg Oral Q breakfast Mansy, Jan A, MD   325 mg at 07/17/22 [id number]   gabapentin (NEURONTIN) capsule 300 mg  300 mg Oral Daily Vladimir Crofts, MD   300 mg at 07/17/22 [id number]   hydrALAZINE (APRESOLINE) injection 10 mg  10 mg Intravenous Q6H PRN Val Riles, MD       hydrALAZINE (APRESOLINE) tablet 50 mg  50 mg Oral Q6H PRN Val Riles, MD       insulin aspart (novoLOG) injection 0-15 Units  0-15 Units Subcutaneous TID WC Val Riles, MD       insulin aspart (novoLOG) injection 0-5 Units  0-5 Units Subcutaneous QHS Val Riles, MD       insulin aspart (novoLOG) injection 4 Units  4 Units Subcutaneous TID WC Val Riles, MD       insulin glargine-yfgn (SEMGLEE) injection 18 Units  18 Units Subcutaneous QHS Val Riles, MD       loperamide (IMODIUM) capsule 2 mg  2 mg Oral PRN Mansy, Jan A, MD       LORazepam (ATIVAN) tablet 2 mg  2 mg Oral Q6H PRN Richarda Osmond, MD   2 mg at 07/14/22 1747   Or   LORazepam (ATIVAN) 2 MG/ML concentrated solution 2 mg  2 mg Oral Q6H PRN Richarda Osmond, MD       melatonin tablet 5 mg  5 mg Oral QHS Val Riles, MD   5 mg at 07/16/22 2220   metoprolol tartrate (LOPRESSOR) tablet 25 mg  25 mg Oral BID Val Riles, MD   25 mg at 07/17/22 [id number]   nicotine (NICODERM CQ - dosed in mg/24 hours) patch 21 mg  21 mg Transdermal Daily Val Riles, MD   21 mg at 07/17/22 [id number]   Followed by   Derrill Memo ON 07/30/2022] nicotine (NICODERM CQ - dosed in mg/24 hours) patch 14 mg  14 mg Transdermal Daily Val Riles, MD       Followed by   Derrill Memo ON 08/06/2022] nicotine (NICODERM CQ - dosed in mg/24 hr) patch 7 mg  7 mg Transdermal Daily Val Riles, MD        ondansetron [facility]) tablet 4 mg  4 mg Oral Q6H PRN Mansy, Jan A, MD       Or   ondansetron [facility]) injection 4 mg  4 mg Intravenous Q6H PRN Mansy, Jan A, MD       QUEtiapine (SEROQUEL) tablet 25 mg  25 mg Oral BID Richarda Osmond, MD   25 mg at 07/17/22 0806   tamsulosin ([place])  capsule 0.4 mg  0.4 mg Oral QPC supper Val Riles, MD   0.4 mg at 07/16/22 1738   traZODone (DESYREL) tablet 25 mg  25 mg Oral QHS Val Riles, MD   25 mg at 07/16/22 2221   witch hazel-glycerin (TUCKS) pad   Topical PRN Richarda Osmond, MD         Discharge Medications: Please see discharge summary for a list of discharge medications.  Relevant Imaging Results:  Relevant Lab Results:   Additional Information    Beverly Sessions, RN

## 2022-07-17 NOTE — TOC Progression Note (Signed)
Transition of Care [facility]) - Progression Note    Patient Details  Name: Mitchell Price MRN: [id number] Date of Birth: 1945-12-31  [facility] CM/SW Contact  Beverly Sessions, RN Phone Number: 07/17/2022, 3:15 PM  Clinical Narrative:    Received email from [facility] [email or web address] with dss confirming special assistance medicaid has been started.  Secure emailed her updated [place]  Notified [place] Miners with the family care home.  He request to email Roni to get additional.  Roni was in agreement for me to provide [facility] with her email   Expected Discharge Plan: Assisted Living Barriers to Discharge: ED Unsafe disposition  Expected Discharge Plan and Services   Discharge Planning Services: CM Consult   Living arrangements for the past 2 months: Single Family Home                 DME Arranged: N/A DME Agency: NA                   Social Determinants of Health (SDOH) Interventions SDOH Screenings   Tobacco Use: High Risk (07/08/2022)    Readmission Risk Interventions     No data to display

## 2022-07-17 NOTE — Progress Notes (Signed)
[facility] Progress Note  Patient: Mitchell Price    [id number]  DOA: 06/17/2022     Date of Service: the patient was seen and examined on 07/17/2022  Chief Complaint  Patient presents with   Fall   Brief hospital course: Mitchell Price is a 77 y.o. male with a PMH significant for type  2 diabetes mellitus, essential hypertension, history of DVT on Eliquis, history of PUD, diverticulosis, type diabetes mellitus and dyslipidemia.   They presented from ED to the ED on 06/17/2022 with intractable diarrhea x 1 days. He had been boarding in the ED awaiting long-term placement when he developed intractable diarrhea with incontinence. The dehydration that resulted contributed to an AKI.    In the ED, it was found that they had BP of 133/84 with temperature of 98, heart rate of 83 and respiratory rate of 20 with pulse oximetry of 96% on room air.  Significant findings included:  BUN of 41 and creatinine 1.52 compared to 22/1.11 on 1/10 and blood glucose of 237.  CBC showed hemoglobin 12.3 and hematocrit 37.8 with WBC of 5.5 and platelets 249.  Previous H&H were close. EKG On 1/10 showed normal sinus rhythm with a rate of 81 with left axis deviation and prolonged QT interval with QTc of 480 MS. Imaging: Most recent two-view chest x-ray on 1/10 showed linear scarring or atelectasis at the right lung base.  Abdominal pelvic CT scan this morning revealed bilateral hyperdense renal lesions that may be hemorrhagic/proteinaceous cysts with no acute abnormality in the abdomen or pelvis.  It showed cholelithiasis and body wall edema.  There was a recommendation for further follow-up with nonemergent MRI of the abdomen with and without contrast to rule out malignancy.   They were initially treated with 4 mg of IV Zofran and 1 L bolus of IV lactated Ringer and stool pathogen panel was ordered. Patient was not agreeable to sample collection.    Patient was admitted to medicine service for further  workup and management of diarrhea as outlined in detail below.   Assessment and Plan: Principal Problem:   AKI (acute kidney injury) ([place]) Active Problems:   Memory changes   Intractable diarrhea   Uncontrolled type 2 diabetes mellitus with hyperglycemia, with long-term current use of insulin (HCC)   Dyslipidemia   History of DVT (deep vein thrombosis)   Essential hypertension   Diabetic neuropathy (H[facility])   # AKI, prerenal due to dehydration secondary to diarrhea Cr 1.52--1.3--1.14--1.35  gradually improving 2/4 decreased IVF NS 50 ml/hr, discontinued IV fluid on 2/6 RN was advised to encourage for oral fluid intake  2/6 urinary retention, Foley catheter was inserted, 1200 ml urine was collected.  Keep Foley for 1 week and then DC for voiding trial on 2/14 started Flomax. - repeat BMP am 2/9 Cr 1.5 slightly elevated, started NS 75 mill per hour for overnight hydration, reassess tomorrow a.m.  # Intractable diarrhea- Resolved  His abdominal exam is benign and abdominal CT scan showed no acute abdominal or pelvic abnormalities. Stool sample was not sent for GIP, diarrhea resolved. continue to monitor hydration status. S/p Florastor for 5 days  # Dementia- patient is oriented to self only and poorly redirected. His son stated that he is at his baseline. Will need placement - TOC engaged for placement - 1:1 sitter - delirium precautions - seroquel  - PRN haldol, ativan   # Bed bugs- found after admission. Room/fabrics cleaned/replaced   # Uncontrolled type 2 diabetes mellitus  with hyperglycemia, with long-term current use of insulin (HCC) On 2/2 patient had an episode of hypoglycemia, discontinued Semglee and discontinued home oral antidiabetic medications.   Follow hypoglycemia protocol Continue NovoLog sliding scale, continue diabetic diet. Monitor FSBG. 2/6 started Semglee 15 units nightly, consulted diabetic coordinator   Dyslipidemia - continue statin therapy.    Essential hypertension-  -  hold off lisinopril given his acute kidney injury. 2/2 started Lopressor with holding parameters. Use hydralazine po or IV as per BP parameters  History of DVT (deep vein thrombosis) - continue Eliquis    Diabetic neuropathy - continue Neurontin.  Insomnia, started melatonin 5 mg p.o. nightly and trazodone 25 mg p.o. nightly   Body mass index is 21.96 kg/m.  Interventions:   On admission patient had severe dehydration and low appetite, developed AKI due to significant diarrhea.   On 2/1 patient continued to have diarrhea, poor appetite and patient required IV fluid.   On 2/2 diarrhea resolved, AKI resolved, appetite is improving.  Decreased IV fluid NS 50 mL/h for gentle hydration, patient has significant dementia and may not drink enough water to keep himself hydrated. 2/5 d/c'd IVF   Diet: Carb modified diet DVT Prophylaxis: Therapeutic Anticoagulation with Eliquis    Advance goals of care discussion: Full code  Family Communication: family was not present at bedside, at the time of interview.  The pt provided permission to discuss medical plan with the family. Opportunity was given to ask question and all questions were answered satisfactorily.  Patient has significant dementia AAO x 1  Disposition:  Pt is from Home, admitted with worsening of dementia, awaiting in the ED for placement, developed diarrhea, dehydration and AKI so admitted as an inpatient for IV hydration.  AKI resolved, appetite improved, diarrhea resolved, had urinary retention, Foley catheter was inserted on 2/6.  Patient is clinically stable, medically optimized to discharge, awaiting placement. Discharge to LTC placement, when bed available..  Subjective: No significant events overnight, patient was resting comfortably in the bed, denies any active issues, no chest pain or palpitation, no shortness of breath, no abdominal pain, no nausea vomiting or diarrhea.   Physical  Exam: General: NAD, lying comfortably in the bed  Appear in no distress, affect appropriate Eyes: sleepy ENT: Oral Mucosa Clear, moist  Neck: no JVD,  Cardiovascular: S1 and S2 Present, no Murmur,  Respiratory: good respiratory effort, Bilateral Air entry equal and Decreased, no Crackles, no wheezes Abdomen: Bowel Sound present, Soft and no tenderness,  Skin: no rashes Extremities: no Pedal edema, no calf tenderness Neurologic: without any new focal findings, AAO x 1 Gait not checked due to patient safety concerns  Vitals:   07/16/22 1113 07/16/22 1802 07/16/22 2106 07/17/22 0759  BP: 126/73 114/72 126/70 (!) 115/59  Pulse:  78 85 91  Resp:  17 16 18  $ Temp:  98.1 F (36.7 C) 98.8 F (37.1 C) 99 F (37.2 C)  TempSrc:  Oral Oral Oral  SpO2:  97% 100% 99%  Weight:      Height:        Intake/Output Summary (Last 24 hours) at 07/17/2022 1155 Last data filed at 07/17/2022 0915 Gross per 24 hour  Intake 1095 ml  Output 1975 ml  Net -880 ml   Filed Weights   06/17/22 1549 07/08/22 1841  Weight: 86 kg 84 kg    Data Reviewed: I have personally reviewed and interpreted daily labs, tele strips, imagings as discussed above. I reviewed all nursing notes, pharmacy notes,  vitals, pertinent old records I have discussed plan of care as described above with RN and patient/family.  CBC: Recent Labs  Lab 07/13/22 0351 07/14/22 0614 07/15/22 0547 07/16/22 0559 07/17/22 0407  WBC 5.2 4.9 5.8 4.9 5.0  HGB 10.7* 12.7* 11.1* 11.5* 11.9*  HCT 32.2* 38.8* 34.1* 34.8* 36.1*  MCV 101.6* 102.4* 101.2* 101.2* 99.7  PLT 183 206 189 188 0000000   Basic Metabolic Panel: Recent Labs  Lab 07/11/22 0609 07/12/22 0516 07/13/22 0351 07/14/22 0614 07/15/22 0547 07/16/22 0559 07/17/22 0407  NA 137 138 138 136 137 135 136  K 4.1 4.8 4.6 4.8 5.1 4.4 4.3  CL 107 111 110 105 110 105 106  CO2 21* 20* 21* 23 23 22 23  $ GLUCOSE 100* 304* 150* 187* 131* 152* 290*  BUN 36* 37* 37* 39* 40* 39* 43*   CREATININE 1.41* 1.33* 1.35* 1.45* 1.34* 1.35* 1.50*  CALCIUM 8.8* 9.0 8.8* 9.5 9.0 8.9 8.9  MG 2.4 2.2 2.0  --   --   --   --   PHOS 5.0* 3.2 3.2  --   --   --   --     Studies: No results found.  Scheduled Meds:  apixaban  5 mg Oral BID   atorvastatin  40 mg Oral Daily   Chlorhexidine Gluconate Cloth  6 each Topical Daily   feeding supplement (GLUCERNA SHAKE)  237 mL Oral TID BM   ferrous sulfate  325 mg Oral Q breakfast   gabapentin  300 mg Oral Daily   insulin aspart  0-15 Units Subcutaneous TID AC & HS   insulin aspart  3 Units Subcutaneous TID WC   insulin glargine-yfgn  15 Units Subcutaneous QHS   melatonin  5 mg Oral QHS   metoprolol tartrate  25 mg Oral BID   nicotine  21 mg Transdermal Daily   Followed by   Derrill Memo ON 07/30/2022] nicotine  14 mg Transdermal Daily   Followed by   Derrill Memo ON 08/06/2022] nicotine  7 mg Transdermal Daily   QUEtiapine  25 mg Oral BID   tamsulosin  0.4 mg Oral QPC supper   traZODone  25 mg Oral QHS   Continuous Infusions:  sodium chloride 75 mL/hr at 07/17/22 0932    PRN Meds: acetaminophen **OR** acetaminophen, hydrALAZINE, hydrALAZINE, loperamide, LORazepam **OR** LORazepam, ondansetron **OR** ondansetron (ZOFRAN) IV, witch hazel-glycerin  Time spent: 35 minutes  Author: Val Riles. MD [facility] 07/17/2022 11:55 AM  To reach On-call, see care teams to locate the attending and reach out to them via [email or web address]. If 7PM-7AM, please contact night-coverage If you still have difficulty reaching the attending provider, please page the [facility] (Director on Call) for [facility] on amion for assistance.

## 2022-07-17 NOTE — Plan of Care (Signed)
  Problem: Education: Goal: Ability to describe self-care measures that may prevent or decrease complications (Diabetes Survival Skills Education) will improve Outcome: Not Progressing Goal: Individualized Educational Video(s) Outcome: Not Progressing   Problem: Coping: Goal: Ability to adjust to condition or change in health will improve Outcome: Progressing   Problem: Fluid Volume: Goal: Ability to maintain a balanced intake and output will improve Outcome: Progressing   Problem: Health Behavior/Discharge Planning: Goal: Ability to identify and utilize available resources and services will improve Outcome: Not Progressing Goal: Ability to manage health-related needs will improve Outcome: Not Progressing   Problem: Metabolic: Goal: Ability to maintain appropriate glucose levels will improve Outcome: Progressing   Problem: Nutritional: Goal: Maintenance of adequate nutrition will improve Outcome: Progressing Goal: Progress toward achieving an optimal weight will improve Outcome: Progressing   Problem: Skin Integrity: Goal: Risk for impaired skin integrity will decrease Outcome: Progressing   Problem: Tissue Perfusion: Goal: Adequacy of tissue perfusion will improve Outcome: Progressing   Problem: Health Behavior/Discharge Planning: Goal: Ability to manage health-related needs will improve Outcome: Progressing   Problem: Clinical Measurements: Goal: Ability to maintain clinical measurements within normal limits will improve Outcome: Progressing Goal: Will remain free from infection Outcome: Progressing Goal: Diagnostic test results will improve Outcome: Progressing Goal: Respiratory complications will improve Outcome: Progressing Goal: Cardiovascular complication will be avoided Outcome: Progressing   Problem: Activity: Goal: Risk for activity intolerance will decrease Outcome: Progressing   Problem: Nutrition: Goal: Adequate nutrition will be  maintained Outcome: Progressing   Problem: Coping: Goal: Level of anxiety will decrease Outcome: Progressing   Problem: Elimination: Goal: Will not experience complications related to bowel motility Outcome: Progressing Goal: Will not experience complications related to urinary retention Outcome: Progressing   Problem: Pain Managment: Goal: General experience of comfort will improve Outcome: Progressing   Problem: Safety: Goal: Ability to remain free from injury will improve Outcome: Progressing   Problem: Skin Integrity: Goal: Risk for impaired skin integrity will decrease Outcome: Progressing

## 2022-07-17 NOTE — NC FL2 (Signed)
[place] LEVEL OF CARE FORM     IDENTIFICATION  Patient Name: Mitchell Price Birthdate: 05-Feb-1946 Sex: male Admission Date (Current Location): 06/17/2022  [facility] and [place] Number:  Engineering geologist and Address:  [facility], [address] [place], [place] [address]      Provider Number: [id number]  Attending Physician Name and Address:  Val Riles, MD  Relative Name and Phone Number:  [name]- son- [phone]    Current Level of Care: Hospital Recommended Level of Care: [place], [place] Prior Approval Number:    Date Approved/Denied:   PASRR Number:    Discharge Plan: Other (Comment) (alf/family care home)    Current Diagnoses: Dementia  Patient Active Problem List   Diagnosis Date Noted   Altered mental status 07/09/2022   Fall 07/09/2022   Hyperglycemia 07/09/2022   Diarrhea of infectious origin 07/09/2022   Intractable diarrhea 07/08/2022   Uncontrolled type 2 diabetes mellitus with hyperglycemia, with long-term current use of insulin ([place]) 07/08/2022   Diabetic neuropathy ([place]) 07/08/2022   Dyslipidemia 07/08/2022   History of DVT (deep vein thrombosis) 07/08/2022   Essential hypertension 07/08/2022   Memory changes 06/19/2022   CKD (chronic kidney disease) stage 3, GFR 30-59 ml/min ([place]) 09/12/2019   Melena 09/12/2019   Acute blood loss anemia 09/12/2019   History of hemorrhoids 09/12/2019   Type 2 diabetes mellitus with stage 3 chronic kidney disease ([place]) 09/12/2019   AKI (acute kidney injury) ([place]) 09/12/2019   PUD (peptic ulcer disease) 09/12/2019   HTN (hypertension) 09/12/2019   Upper GI bleed 09/12/2019   Hyperglycemia due to type 2 diabetes mellitus ([place]) 09/12/2019   BPH (benign prostatic hyperplasia) 01/17/2019   HLD (hyperlipidemia) 01/17/2019   Multiple thyroid nodules 07/18/2017   Grade III hemorrhoids    Acute GI bleeding 07/13/2017   GIB  (gastrointestinal bleeding) 07/12/2017   Acidosis, hyperchloremic 12/13/2015   Erectile dysfunction due to arterial insufficiency 04/13/2014   Diverticulosis 08/25/2013   Internal hemorrhoid, bleeding 08/25/2013   Diabetes type 2, uncontrolled 08/22/2013   Renal mass 08/21/2013   Lens replaced by other means 01/26/2013   Status post cataract extraction 01/26/2013   Anemia 04/01/2012    Orientation RESPIRATION BLADDER Height & Weight     Self, Place  Normal Continent Weight: 84 kg Height:  6' 5"$  (195.6 cm)  BEHAVIORAL SYMPTOMS/MOOD NEUROLOGICAL BOWEL NUTRITION STATUS  Wanderer   Continent Diet (regular)  AMBULATORY STATUS COMMUNICATION OF NEEDS Skin   Independent Verbally Normal                       Personal Care Assistance Level of Assistance  Bathing, Feeding, Dressing Bathing Assistance: Limited assistance Feeding assistance: Limited assistance Dressing Assistance: Independent     Functional Limitations Info  Sight, Hearing, Speech Sight Info: Adequate Hearing Info: Adequate Speech Info: Adequate    SPECIAL CARE FACTORS FREQUENCY                       Contractures Contractures Info: Not present    Additional Factors Info  Code Status, Allergies Code Status Info: full Allergies Info: NKDA           Current Medications (07/17/2022):  This is the current hospital active medication list Current Facility-Administered Medications  Medication Dose Route Frequency Provider Last Rate Last Admin   0.9 %  sodium chloride infusion   Intravenous Continuous Val Riles, MD 75  mL/hr at 07/17/22 0932 New Bag at 07/17/22 0932   acetaminophen (TYLENOL) tablet 650 mg  650 mg Oral Q6H PRN Mansy, Jan A, MD   650 mg at 07/16/22 2220   Or   acetaminophen (TYLENOL) suppository 650 mg  650 mg Rectal Q6H PRN Mansy, Arvella Merles, MD       apixaban Arne [place]) tablet 5 mg  5 mg Oral BID Vladimir Crofts, MD   5 mg at 07/17/22 0806   atorvastatin (LIPITOR) tablet 40 mg  40 mg Oral  Daily Vladimir Crofts, MD   40 mg at 07/17/22 [id number]   Chlorhexidine Gluconate Cloth 2 % PADS 6 each  6 each Topical Daily Val Riles, MD   6 each at 07/17/22 0807   feeding supplement (GLUCERNA SHAKE) (GLUCERNA SHAKE) liquid 237 mL  237 mL Oral TID BM Val Riles, MD   237 mL at 07/17/22 1212   ferrous sulfate tablet 325 mg  325 mg Oral Q breakfast Mansy, Jan A, MD   325 mg at 07/17/22 [id number]   gabapentin (NEURONTIN) capsule 300 mg  300 mg Oral Daily Vladimir Crofts, MD   300 mg at 07/17/22 [id number]   hydrALAZINE (APRESOLINE) injection 10 mg  10 mg Intravenous Q6H PRN Val Riles, MD       hydrALAZINE (APRESOLINE) tablet 50 mg  50 mg Oral Q6H PRN Val Riles, MD       insulin aspart (novoLOG) injection 0-15 Units  0-15 Units Subcutaneous TID WC Val Riles, MD       insulin aspart (novoLOG) injection 0-5 Units  0-5 Units Subcutaneous QHS Val Riles, MD       insulin aspart (novoLOG) injection 4 Units  4 Units Subcutaneous TID WC Val Riles, MD       insulin glargine-yfgn (SEMGLEE) injection 18 Units  18 Units Subcutaneous QHS Val Riles, MD       loperamide (IMODIUM) capsule 2 mg  2 mg Oral PRN Mansy, Jan A, MD       LORazepam (ATIVAN) tablet 2 mg  2 mg Oral Q6H PRN Richarda Osmond, MD   2 mg at 07/14/22 1747   Or   LORazepam (ATIVAN) 2 MG/ML concentrated solution 2 mg  2 mg Oral Q6H PRN Richarda Osmond, MD       melatonin tablet 5 mg  5 mg Oral QHS Val Riles, MD   5 mg at 07/16/22 2220   metoprolol tartrate (LOPRESSOR) tablet 25 mg  25 mg Oral BID Val Riles, MD   25 mg at 07/17/22 [id number]   nicotine (NICODERM CQ - dosed in mg/24 hours) patch 21 mg  21 mg Transdermal Daily Val Riles, MD   21 mg at 07/17/22 [id number]   Followed by   Derrill Memo ON 07/30/2022] nicotine (NICODERM CQ - dosed in mg/24 hours) patch 14 mg  14 mg Transdermal Daily Val Riles, MD       Followed by   Derrill Memo ON 08/06/2022] nicotine (NICODERM CQ - dosed in mg/24 hr) patch 7 mg  7 mg Transdermal Daily Val Riles, MD       ondansetron [facility]) tablet 4 mg  4 mg Oral Q6H PRN Mansy, Jan A, MD       Or   ondansetron [facility]) injection 4 mg  4 mg Intravenous Q6H PRN Mansy, Jan A, MD       QUEtiapine (SEROQUEL) tablet 25 mg  25 mg Oral BID Richarda Osmond, MD   25 mg at 07/17/22 [phone]  tamsulosin (FLOMAX) capsule 0.4 mg  0.4 mg Oral QPC supper Val Riles, MD   0.4 mg at 07/16/22 1738   traZODone (DESYREL) tablet 25 mg  25 mg Oral QHS Val Riles, MD   25 mg at 07/16/22 2221   witch hazel-glycerin (TUCKS) pad   Topical PRN Richarda Osmond, MD         Discharge Medications: Please see discharge summary for a list of discharge medications.  Relevant Imaging Results:  Relevant Lab Results:   Additional Information    Beverly Sessions, RN

## 2022-07-18 ENCOUNTER — Inpatient Hospital Stay: Payer: Medicare HMO

## 2022-07-18 DIAGNOSIS — N179 Acute kidney failure, unspecified: Secondary | ICD-10-CM | POA: Diagnosis not present

## 2022-07-18 LAB — CBC
HCT: 33.7 % — ABNORMAL LOW (ref 39.0–52.0)
Hemoglobin: 11.3 g/dL — ABNORMAL LOW (ref 13.0–17.0)
MCH: 33.2 pg (ref 26.0–34.0)
MCHC: 33.5 g/dL (ref 30.0–36.0)
MCV: 99.1 fL (ref 80.0–100.0)
Platelets: 170 10*3/uL (ref 150–400)
RBC: 3.4 MIL/uL — ABNORMAL LOW (ref 4.22–5.81)
RDW: 12.5 % (ref 11.5–15.5)
WBC: 5 10*3/uL (ref 4.0–10.5)
nRBC: 0 % (ref 0.0–0.2)

## 2022-07-18 LAB — BASIC METABOLIC PANEL
Anion gap: 7 (ref 5–15)
BUN: 30 mg/dL — ABNORMAL HIGH (ref 8–23)
CO2: 21 mmol/L — ABNORMAL LOW (ref 22–32)
Calcium: 8.6 mg/dL — ABNORMAL LOW (ref 8.9–10.3)
Chloride: 105 mmol/L (ref 98–111)
Creatinine, Ser: 1.41 mg/dL — ABNORMAL HIGH (ref 0.61–1.24)
GFR, Estimated: 51 mL/min — ABNORMAL LOW (ref >60)
Glucose, Bld: 474 mg/dL — ABNORMAL HIGH (ref 70–99)
Potassium: 4.6 mmol/L (ref 3.5–5.1)
Sodium: 133 mmol/L — ABNORMAL LOW (ref 135–145)

## 2022-07-18 LAB — GLUCOSE, CAPILLARY
Glucose-Capillary: 255 mg/dL — ABNORMAL HIGH (ref 70–99)
Glucose-Capillary: 286 mg/dL — ABNORMAL HIGH (ref 70–99)
Glucose-Capillary: 413 mg/dL — ABNORMAL HIGH (ref 70–99)
Glucose-Capillary: 431 mg/dL — ABNORMAL HIGH (ref 70–99)

## 2022-07-18 MED ORDER — INSULIN GLARGINE-YFGN 100 UNIT/ML ~~LOC~~ SOLN
24.0000 [IU] | Freq: Every day | SUBCUTANEOUS | Status: DC
  Administered 2022-07-18 – 2022-07-20 (×3): 24 [IU] via SUBCUTANEOUS
  Filled 2022-07-18 (×3): qty 0.24

## 2022-07-18 MED ORDER — INSULIN ASPART 100 UNIT/ML IJ SOLN
20.0000 [IU] | Freq: Once | INTRAMUSCULAR | Status: AC
  Administered 2022-07-18: 20 [IU] via SUBCUTANEOUS

## 2022-07-18 MED ORDER — INSULIN ASPART 100 UNIT/ML IJ SOLN
5.0000 [IU] | Freq: Three times a day (TID) | INTRAMUSCULAR | Status: DC
  Administered 2022-07-19 – 2022-07-21 (×8): 5 [IU] via SUBCUTANEOUS
  Filled 2022-07-18 (×9): qty 1

## 2022-07-18 NOTE — Progress Notes (Addendum)
CBG 413. Tech reported candy and chocolate found in his room. Patient continues to eat chocolate after been educated and refuse bag of Hershey chocolate to be removed from room.  Also lab just stopped by to draw BMP and CBC which he has been refusing all day. He refused again.  Dr. Feliberto Gottron notified.

## 2022-07-18 NOTE — Progress Notes (Signed)
  Progress Note   Patient: Mitchell Price [id number] DOB: 04-May-1946 DOA: 06/17/2022     8 DOS: the patient was seen and examined on 07/18/2022   Brief hospital course:  Assessment and Plan: * AKI (acute kidney injury) ([place]) - Cr stable   Intractable diarrhea - Imodium 2 mg PO daily PRN   Uncontrolled type 2 diabetes mellitus with hyperglycemia, with long-term current use of insulin (HCC) - Novolog SS ACHS  - Novolog 5 units tid w meals - Glargine 24 units sq daily   Dyslipidemia - Lipitor 40 mg PO daily   Essential hypertension - Lopressor 25 mg PO bid   History of DVT (deep vein thrombosis) - Eliquis 5 mg PO bid   Diabetic neuropathy (HCC) - Gabapentin 300 mg PO daily   DVT prophylaxis: Pt ambulatory     Subjective: Pt seen and examined at the bedside. Cr stable. Case manager is working on disposition to an assisted living.     Physical Exam: Vitals:   07/17/22 1559 07/17/22 2100 07/18/22 0721 07/18/22 1130  BP: 132/80 128/78 115/83 116/71  Pulse: 84 85 91 74  Resp: 16 18 17 17   Temp: 99.1 F (37.3 C) 98.8 F (37.1 C) 99.1 F (37.3 C) 98.8 F (37.1 C)  TempSrc: Oral Oral Oral Oral  SpO2: 98% 98% 98% 99%  Weight:      Height:       Physical Exam Constitutional:      Appearance: Normal appearance.  HENT:     Head: Normocephalic and atraumatic.     Mouth/Throat:     Mouth: Mucous membranes are moist.  Cardiovascular:     Rate and Rhythm: Normal rate and regular rhythm.  Pulmonary:     Effort: Pulmonary effort is normal.  Abdominal:     General: Abdomen is flat.     Palpations: Abdomen is soft.  Musculoskeletal:     Cervical back: Neck supple.  Skin:    General: Skin is warm.  Neurological:     Mental Status: He is alert. Mental status is at baseline.  Psychiatric:        Mood and Affect: Mood normal.    Data Reviewed:   Disposition: Status is: Inpatient  Planned Discharge Destination:  Assisted living     Time spent: 35  minutes  Author: Lucienne Minks , MD 07/18/2022 12:42 PM  For on call review [email or web address].

## 2022-07-18 NOTE — Progress Notes (Signed)
Assume patient care. Patient resting in bed, no sign of acute distress noted at this time.Safety sitter at bedside.

## 2022-07-18 NOTE — Progress Notes (Signed)
Inpatient Diabetes Program Recommendations  AACE/ADA: New Consensus Statement on Inpatient Glycemic Control (2015)  Target Ranges:  Prepandial:   less than 140 mg/dL      Peak postprandial:   less than 180 mg/dL (1-2 hours)      Critically ill patients:  140 - 180 mg/dL   Lab Results  Component Value Date   GLUCAP 286 (H) 07/18/2022    Review of Glycemic Control  Latest Reference Range & Units 07/17/22 11:57 07/17/22 16:04 07/17/22 21:25 07/18/22 07:43 07/18/22 12:27  Glucose-Capillary 70 - 99 mg/dL 258 (H) 234 (H) 264 (H) 255 (H) 286 (H)  Diabetes history: DM 2 Outpatient Diabetes medications: Metformin 1000 mg BID, Actos 30 mg daily, Jardiance 25 mg daily, Amaryl 4 mg daily  Current orders for Inpatient glycemic control: Semglee 18 units q hs, Novolog 0-15 units tid + hs, Novolog 4 units tid with meals Inpatient Diabetes Program Recommendations:     Consider increasing Semglee to 24 units q HS and increase meal coverage to 5 units tid with meals (hold if patient eats less than 50% or NPO).   Thanks,  Adah Perl, RN, BC-ADM Inpatient Diabetes Coordinator Pager [phone]  (8a-5p)

## 2022-07-19 DIAGNOSIS — N179 Acute kidney failure, unspecified: Secondary | ICD-10-CM | POA: Diagnosis not present

## 2022-07-19 LAB — BASIC METABOLIC PANEL
Anion gap: 5 (ref 5–15)
BUN: 28 mg/dL — ABNORMAL HIGH (ref 8–23)
CO2: 22 mmol/L (ref 22–32)
Calcium: 8.5 mg/dL — ABNORMAL LOW (ref 8.9–10.3)
Chloride: 109 mmol/L (ref 98–111)
Creatinine, Ser: 1.32 mg/dL — ABNORMAL HIGH (ref 0.61–1.24)
GFR, Estimated: 55 mL/min — ABNORMAL LOW (ref >60)
Glucose, Bld: 147 mg/dL — ABNORMAL HIGH (ref 70–99)
Potassium: 4.2 mmol/L (ref 3.5–5.1)
Sodium: 136 mmol/L (ref 135–145)

## 2022-07-19 LAB — GLUCOSE, CAPILLARY
Glucose-Capillary: 106 mg/dL — ABNORMAL HIGH (ref 70–99)
Glucose-Capillary: 260 mg/dL — ABNORMAL HIGH (ref 70–99)
Glucose-Capillary: 274 mg/dL — ABNORMAL HIGH (ref 70–99)
Glucose-Capillary: 274 mg/dL — ABNORMAL HIGH (ref 70–99)
Glucose-Capillary: 195 mg/dL — ABNORMAL HIGH (ref 70–99)

## 2022-07-19 LAB — CBC
HCT: 32 % — ABNORMAL LOW (ref 39.0–52.0)
Hemoglobin: 10.6 g/dL — ABNORMAL LOW (ref 13.0–17.0)
MCH: 33.4 pg (ref 26.0–34.0)
MCHC: 33.1 g/dL (ref 30.0–36.0)
MCV: 100.9 fL — ABNORMAL HIGH (ref 80.0–100.0)
Platelets: 169 10*3/uL (ref 150–400)
RBC: 3.17 MIL/uL — ABNORMAL LOW (ref 4.22–5.81)
RDW: 12.6 % (ref 11.5–15.5)
WBC: 5.7 10*3/uL (ref 4.0–10.5)
nRBC: 0 % (ref 0.0–0.2)

## 2022-07-19 NOTE — Progress Notes (Signed)
[facility] Progress Note  Patient: Mitchell Price    [id number]  DOA: 06/17/2022     Date of Service: the patient was seen and examined on 07/19/2022  Chief Complaint  Patient presents with   Fall   Brief hospital course: Mitchell Price is a 77 y.o. male with a PMH significant for type  2 diabetes mellitus, essential hypertension, history of DVT on Eliquis, history of PUD, diverticulosis, type diabetes mellitus and dyslipidemia.   They presented from ED to the ED on 06/17/2022 with intractable diarrhea x 1 days. He had been boarding in the ED awaiting long-term placement when he developed intractable diarrhea with incontinence. The dehydration that resulted contributed to an AKI.    In the ED, it was found that they had BP of 133/84 with temperature of 98, heart rate of 83 and respiratory rate of 20 with pulse oximetry of 96% on room air.  Significant findings included:  BUN of 41 and creatinine 1.52 compared to 22/1.11 on 1/10 and blood glucose of 237.  CBC showed hemoglobin 12.3 and hematocrit 37.8 with WBC of 5.5 and platelets 249.  Previous H&H were close. EKG On 1/10 showed normal sinus rhythm with a rate of 81 with left axis deviation and prolonged QT interval with QTc of 480 MS. Imaging: Most recent two-view chest x-ray on 1/10 showed linear scarring or atelectasis at the right lung base.  Abdominal pelvic CT scan this morning revealed bilateral hyperdense renal lesions that may be hemorrhagic/proteinaceous cysts with no acute abnormality in the abdomen or pelvis.  It showed cholelithiasis and body wall edema.  There was a recommendation for further follow-up with nonemergent MRI of the abdomen with and without contrast to rule out malignancy.   They were initially treated with 4 mg of IV Zofran and 1 L bolus of IV lactated Ringer and stool pathogen panel was ordered. Patient was not agreeable to sample collection.    Patient was admitted to medicine service for further  workup and management of diarrhea as outlined in detail below.   Assessment and Plan: Principal Problem:   AKI (acute kidney injury) ([place]) Active Problems:   Memory changes   Intractable diarrhea   Uncontrolled type 2 diabetes mellitus with hyperglycemia, with long-term current use of insulin (HCC)   Dyslipidemia   History of DVT (deep vein thrombosis)   Essential hypertension   Diabetic neuropathy (HCC)   # AKI, prerenal due to dehydration secondary to diarrhea Cr 1.52--1.3--1.14--1.35  gradually improving 2/4 decreased IVF NS 50 ml/hr, discontinued IV fluid on 2/6 RN was advised to encourage for oral fluid intake  2/6 urinary retention, Foley catheter was inserted, 1200 ml urine was collected.  Keep Foley for 1 week and then DC for voiding trial on 2/14 started Flomax. - repeat BMP am 2/9 Cr 1.5 slightly elevated, started NS 75 mill per hour for overnight hydration 2/11 Cr 1.32 stable  # Intractable diarrhea- Resolved  His abdominal exam is benign and abdominal CT scan showed no acute abdominal or pelvic abnormalities. Stool sample was not sent for GIP, diarrhea resolved. continue to monitor hydration status. S/p Florastor for 5 days  # Dementia- patient is oriented to self only and poorly redirected. His son stated that he is at his baseline. Will need placement - TOC engaged for placement - 1:1 sitter - delirium precautions - seroquel  - PRN haldol, ativan   # Bed bugs- found after admission. Room/fabrics cleaned/replaced   # Uncontrolled type 2 diabetes  mellitus with hyperglycemia, with long-term current use of insulin (HCC) On 2/2 patient had an episode of hypoglycemia, discontinued Semglee and discontinued home oral antidiabetic medications.   Follow hypoglycemia protocol Continue NovoLog sliding scale, continue diabetic diet. Monitor FSBG. 2/6 started Semglee 15 units nightly, consulted diabetic coordinator   Dyslipidemia - continue statin therapy.    Essential hypertension-  -  hold off lisinopril given his acute kidney injury. 2/2 started Lopressor with holding parameters. Use hydralazine po or IV as per BP parameters  History of DVT (deep vein thrombosis) - continue Eliquis    Diabetic neuropathy - continue Neurontin.  Insomnia, started melatonin 5 mg p.o. nightly and trazodone 25 mg p.o. nightly   Body mass index is 21.96 kg/m.  Interventions:   On admission patient had severe dehydration and low appetite, developed AKI due to significant diarrhea.   On 2/1 patient continued to have diarrhea, poor appetite and patient required IV fluid.   On 2/2 diarrhea resolved, AKI resolved, appetite is improving.  Decreased IV fluid NS 50 mL/h for gentle hydration, patient has significant dementia and may not drink enough water to keep himself hydrated. 2/5 d/c'd IVF   Diet: Carb modified diet DVT Prophylaxis: Therapeutic Anticoagulation with Eliquis    Advance goals of care discussion: Full code  Family Communication: family was not present at bedside, at the time of interview.  The pt provided permission to discuss medical plan with the family. Opportunity was given to ask question and all questions were answered satisfactorily.  Patient has significant dementia AAO x 1  Disposition:  Pt is from Home, admitted with worsening of dementia, awaiting in the ED for placement, developed diarrhea, dehydration and AKI so admitted as an inpatient for IV hydration.  AKI resolved, appetite improved, diarrhea resolved, had urinary retention, Foley catheter was inserted on 2/6.  Patient is clinically stable, medically optimized to discharge, awaiting placement. Discharge to LTC placement, when bed available..  Subjective: No significant events overnight, patient was sitting comfortably and eating breakfast, denied any active issues.   Physical Exam: General: NAD, lying comfortably in the bed  Appear in no distress, affect  appropriate Eyes: sleepy ENT: Oral Mucosa Clear, moist  Neck: no JVD,  Cardiovascular: S1 and S2 Present, no Murmur,  Respiratory: good respiratory effort, Bilateral Air entry equal and Decreased, no Crackles, no wheezes Abdomen: Bowel Sound present, Soft and no tenderness,  Skin: no rashes Extremities: no Pedal edema, no calf tenderness Neurologic: without any new focal findings, AAO x 1 Gait not checked due to patient safety concerns  Vitals:   07/18/22 2029 07/19/22 0654 07/19/22 0724 07/19/22 0929  BP: (!) 146/88 (!) 102/51 105/66 122/81  Pulse: 91 76 72 79  Resp: 16 18 18 16  $ Temp: 99.3 F (37.4 C) 98 F (36.7 C) 98.3 F (36.8 C) 99.2 F (37.3 C)  TempSrc: Oral  Oral Oral  SpO2: 98% 98% 96% 96%  Weight:      Height:        Intake/Output Summary (Last 24 hours) at 07/19/2022 1151 Last data filed at 07/19/2022 [id number] Gross per 24 hour  Intake --  Output 700 ml  Net -700 ml   Filed Weights   06/17/22 1549 07/08/22 1841  Weight: 86 kg 84 kg    Data Reviewed: I have personally reviewed and interpreted daily labs, tele strips, imagings as discussed above. I reviewed all nursing notes, pharmacy notes, vitals, pertinent old records I have discussed plan of care as described above  with RN and patient/family.  CBC: Recent Labs  Lab 07/15/22 0547 07/16/22 0559 07/17/22 0407 07/18/22 2100 07/19/22 0256  WBC 5.8 4.9 5.0 5.0 5.7  HGB 11.1* 11.5* 11.9* 11.3* 10.6*  HCT 34.1* 34.8* 36.1* 33.7* 32.0*  MCV 101.2* 101.2* 99.7 99.1 100.9*  PLT 189 188 179 170 [id number]   Basic Metabolic Panel: Recent Labs  Lab 07/13/22 0351 07/14/22 0614 07/15/22 0547 07/16/22 0559 07/17/22 0407 07/18/22 2100 07/19/22 0256  NA 138   < > 137 135 136 133* 136  K 4.6   < > 5.1 4.4 4.3 4.6 4.2  CL 110   < > 110 105 106 105 109  CO2 21*   < > 23 22 23 $ 21* 22  GLUCOSE 150*   < > 131* 152* 290* 474* 147*  BUN 37*   < > 40* 39* 43* 30* 28*  CREATININE 1.35*   < > 1.34* 1.35* 1.50* 1.41*  1.32*  CALCIUM 8.8*   < > 9.0 8.9 8.9 8.6* 8.5*  MG 2.0  --   --   --   --   --   --   PHOS 3.2  --   --   --   --   --   --    < > = values in this interval not displayed.    Studies: No results found.  Scheduled Meds:  apixaban  5 mg Oral BID   atorvastatin  40 mg Oral Daily   Chlorhexidine Gluconate Cloth  6 each Topical Daily   feeding supplement (GLUCERNA SHAKE)  237 mL Oral TID BM   ferrous sulfate  325 mg Oral Q breakfast   gabapentin  300 mg Oral Daily   insulin aspart  0-15 Units Subcutaneous TID WC   insulin aspart  0-5 Units Subcutaneous QHS   insulin aspart  5 Units Subcutaneous TID WC   insulin glargine-yfgn  24 Units Subcutaneous QHS   melatonin  5 mg Oral QHS   metoprolol tartrate  25 mg Oral BID   nicotine  21 mg Transdermal Daily   Followed by   Derrill Memo ON 07/30/2022] nicotine  14 mg Transdermal Daily   Followed by   Derrill Memo ON 08/06/2022] nicotine  7 mg Transdermal Daily   QUEtiapine  25 mg Oral BID   tamsulosin  0.4 mg Oral QPC supper   traZODone  25 mg Oral QHS   Continuous Infusions:    PRN Meds: acetaminophen **OR** acetaminophen, hydrALAZINE, hydrALAZINE, loperamide, LORazepam **OR** LORazepam, ondansetron **OR** ondansetron (ZOFRAN) IV, witch hazel-glycerin  Time spent: 25 minutes  Author: Val Riles. MD [facility] 07/19/2022 11:51 AM  To reach On-call, see care teams to locate the attending and reach out to them via [email or web address]. If 7PM-7AM, please contact night-coverage If you still have difficulty reaching the attending provider, please page the [facility] (Director on Call) for [facility] on amion for assistance.

## 2022-07-20 DIAGNOSIS — N179 Acute kidney failure, unspecified: Secondary | ICD-10-CM | POA: Diagnosis not present

## 2022-07-20 LAB — CBC
HCT: 34.2 % — ABNORMAL LOW (ref 39.0–52.0)
Hemoglobin: 11.3 g/dL — ABNORMAL LOW (ref 13.0–17.0)
MCH: 32.9 pg (ref 26.0–34.0)
MCHC: 33 g/dL (ref 30.0–36.0)
MCV: 99.7 fL (ref 80.0–100.0)
Platelets: 179 10*3/uL (ref 150–400)
RBC: 3.43 MIL/uL — ABNORMAL LOW (ref 4.22–5.81)
RDW: 12.3 % (ref 11.5–15.5)
WBC: 5.1 10*3/uL (ref 4.0–10.5)
nRBC: 0 % (ref 0.0–0.2)

## 2022-07-20 LAB — GLUCOSE, CAPILLARY
Glucose-Capillary: 254 mg/dL — ABNORMAL HIGH (ref 70–99)
Glucose-Capillary: 333 mg/dL — ABNORMAL HIGH (ref 70–99)
Glucose-Capillary: 140 mg/dL — ABNORMAL HIGH (ref 70–99)
Glucose-Capillary: 131 mg/dL — ABNORMAL HIGH (ref 70–99)
Glucose-Capillary: 170 mg/dL — ABNORMAL HIGH (ref 70–99)

## 2022-07-20 LAB — BASIC METABOLIC PANEL
Anion gap: 6 (ref 5–15)
BUN: 31 mg/dL — ABNORMAL HIGH (ref 8–23)
CO2: 22 mmol/L (ref 22–32)
Calcium: 8.7 mg/dL — ABNORMAL LOW (ref 8.9–10.3)
Chloride: 106 mmol/L (ref 98–111)
Creatinine, Ser: 1.34 mg/dL — ABNORMAL HIGH (ref 0.61–1.24)
GFR, Estimated: 54 mL/min — ABNORMAL LOW (ref >60)
Glucose, Bld: 291 mg/dL — ABNORMAL HIGH (ref 70–99)
Potassium: 4.5 mmol/L (ref 3.5–5.1)
Sodium: 134 mmol/L — ABNORMAL LOW (ref 135–145)

## 2022-07-20 NOTE — Inpatient Diabetes Management (Signed)
Inpatient Diabetes Program Recommendations  AACE/ADA: New Consensus Statement on Inpatient Glycemic Control (2015)  Target Ranges:  Prepandial:   less than 140 mg/dL      Peak postprandial:   less than 180 mg/dL (1-2 hours)      Critically ill patients:  140 - 180 mg/dL    Latest Reference Range & Units 07/19/22 09:12 07/19/22 12:03 07/19/22 12:12 07/19/22 16:43 07/19/22 21:22  Glucose-Capillary 70 - 99 mg/dL 106 (H)  5 units Novolog  260 (H) 274 (H)  13 units Novolog  274 (H)  13 units Novolog  195 (H)    24 units Semglee  (H): Data is abnormally high  Latest Reference Range & Units 07/20/22 08:27  Glucose-Capillary 70 - 99 mg/dL 254 (H)  13 units Novolog   (H): Data is abnormally high   Home DM Meds: Metformin 1000 mg BID     Actos 30 mg daily     Jardiance 25 mg daily     Amaryl 4 mg daily     Current Orders: Semglee 24 units QHS      Novolog Moderate Correction Scale/ SSI (0-15 units) TID AC + HS      Novolog 5 units TID with meals    MD- Please consider:  1. Increase Semglee slightly to 27 units QHS (~10% increase)  2. Increase Novolog Meal Coverage to 7 units TID with meals     --Will follow patient during hospitalization--  Wyn Quaker RN, MSN, [place] Diabetes Coordinator Inpatient Glycemic Control Team Team Pager: [phone] (8a-5p)

## 2022-07-20 NOTE — TOC Progression Note (Signed)
Transition of Care [facility]) - Progression Note    Patient Details  Name: Mitchell Price MRN: [id number] Date of Birth: 09/24/1945  [facility] CM/SW Contact  Beverly Sessions, RN Phone Number: 07/20/2022, 1:56 PM  Clinical Narrative:    Damaris Schooner with [employer] at family care home  [place].  [facility] confirms that he can accept patient on Thursday.  [employer] to provide transportation Per [employer] son will owe $750 to move in on 2/15  TB results and Facesheet secure emailed to [place]  Updated son [name].  He is in agreement.  Kasey to reach out to son for payment   Expected Discharge Plan: Assisted Living Barriers to Discharge: ED Unsafe disposition  Expected Discharge Plan and Services   Discharge Planning Services: CM Consult   Living arrangements for the past 2 months: Single Family Home                 DME Arranged: N/A DME Agency: NA                   Social Determinants of Health (SDOH) Interventions SDOH Screenings   Tobacco Use: High Risk (07/08/2022)    Readmission Risk Interventions     No data to display

## 2022-07-20 NOTE — Progress Notes (Signed)
[facility] Progress Note  Patient: Mitchell Price    [id number]  DOA: 06/17/2022     Date of Service: the patient was seen and examined on 07/20/2022  Chief Complaint  Patient presents with   Fall   Brief hospital course: Mitchell Price is a 77 y.o. male with a PMH significant for type  2 diabetes mellitus, essential hypertension, history of DVT on Eliquis, history of PUD, diverticulosis, type diabetes mellitus and dyslipidemia.   They presented from ED to the ED on 06/17/2022 with intractable diarrhea x 1 days. He had been boarding in the ED awaiting long-term placement when he developed intractable diarrhea with incontinence. The dehydration that resulted contributed to an AKI.    In the ED, it was found that they had BP of 133/84 with temperature of 98, heart rate of 83 and respiratory rate of 20 with pulse oximetry of 96% on room air.  Significant findings included:  BUN of 41 and creatinine 1.52 compared to 22/1.11 on 1/10 and blood glucose of 237.  CBC showed hemoglobin 12.3 and hematocrit 37.8 with WBC of 5.5 and platelets 249.  Previous H&H were close. EKG On 1/10 showed normal sinus rhythm with a rate of 81 with left axis deviation and prolonged QT interval with QTc of 480 MS. Imaging: Most recent two-view chest x-ray on 1/10 showed linear scarring or atelectasis at the right lung base.  Abdominal pelvic CT scan this morning revealed bilateral hyperdense renal lesions that may be hemorrhagic/proteinaceous cysts with no acute abnormality in the abdomen or pelvis.  It showed cholelithiasis and body wall edema.  There was a recommendation for further follow-up with nonemergent MRI of the abdomen with and without contrast to rule out malignancy.   They were initially treated with 4 mg of IV Zofran and 1 L bolus of IV lactated Ringer and stool pathogen panel was ordered. Patient was not agreeable to sample collection.    Patient was admitted to medicine service for further  workup and management of diarrhea as outlined in detail below.   Assessment and Plan: Principal Problem:   AKI (acute kidney injury) ([place]) Active Problems:   Memory changes   Intractable diarrhea   Uncontrolled type 2 diabetes mellitus with hyperglycemia, with long-term current use of insulin (HCC)   Dyslipidemia   History of DVT (deep vein thrombosis)   Essential hypertension   Diabetic neuropathy (HCC)   # AKI, prerenal due to dehydration secondary to diarrhea Cr 1.52--1.3--1.14--1.35  gradually improving 2/4 decreased IVF NS 50 ml/hr, discontinued IV fluid on 2/6 RN was advised to encourage for oral fluid intake  2/6 urinary retention, Foley catheter was inserted, 1200 ml urine was collected.  Keep Foley for 1 week and then DC for voiding trial on 2/14 started Flomax. - repeat BMP am 2/9 Cr 1.5 slightly elevated, started NS 75 mill per hour for overnight hydration 2/12 Cr 1.34 stable  # Intractable diarrhea- Resolved  His abdominal exam is benign and abdominal CT scan showed no acute abdominal or pelvic abnormalities. Stool sample was not sent for GIP, diarrhea resolved. continue to monitor hydration status. S/p Florastor for 5 days  # Dementia- patient is oriented to self only and poorly redirected. His son stated that he is at his baseline. Will need placement - TOC engaged for placement - 1:1 sitter - delirium precautions - seroquel  - PRN haldol, ativan   # Bed bugs- found after admission. Room/fabrics cleaned/replaced   # Uncontrolled type 2 diabetes  mellitus with hyperglycemia, with long-term current use of insulin (HCC) On 2/2 patient had an episode of hypoglycemia, discontinued Semglee and discontinued home oral antidiabetic medications.   Follow hypoglycemia protocol Continue NovoLog sliding scale, continue diabetic diet. Monitor FSBG. 2/6 started Semglee 15 units nightly, consulted diabetic coordinator   Dyslipidemia - continue statin therapy.    Essential hypertension-  -  hold off lisinopril given his acute kidney injury. 2/2 started Lopressor with holding parameters. Use hydralazine po or IV as per BP parameters  History of DVT (deep vein thrombosis) - continue Eliquis    Diabetic neuropathy - continue Neurontin.  Insomnia, started melatonin 5 mg p.o. nightly and trazodone 25 mg p.o. nightly   Body mass index is 21.96 kg/m.  Interventions:   On admission patient had severe dehydration and low appetite, developed AKI due to significant diarrhea.   On 2/1 patient continued to have diarrhea, poor appetite and patient required IV fluid.   On 2/2 diarrhea resolved, AKI resolved, appetite is improving.  Decreased IV fluid NS 50 mL/h for gentle hydration, patient has significant dementia and may not drink enough water to keep himself hydrated. 2/5 d/c'd IVF   Diet: Carb modified diet DVT Prophylaxis: Therapeutic Anticoagulation with Eliquis    Advance goals of care discussion: Full code  Family Communication: family was not present at bedside, at the time of interview.  The pt provided permission to discuss medical plan with the family. Opportunity was given to ask question and all questions were answered satisfactorily.  Patient has significant dementia AAO x 1  Disposition:  Pt is from Home, admitted with worsening of dementia, awaiting in the ED for placement, developed diarrhea, dehydration and AKI so admitted as an inpatient for IV hydration.  AKI resolved, appetite improved, diarrhea resolved, had urinary retention, Foley catheter was inserted on 2/6.  Patient is clinically stable, medically optimized to discharge, awaiting placement. Discharge to LTC placement, when bed available..  Subjective: No significant events overnight, patient was lying comfortably in the bed, he was asking for more food.  Patient did eat breakfast already. Denied any abdominal pain, no nausea vomiting, no diarrhea.  No any active  issues   Physical Exam: General: NAD, lying comfortably in the bed  Appear in no distress, affect appropriate Eyes: sleepy ENT: Oral Mucosa Clear, moist  Neck: no JVD,  Cardiovascular: S1 and S2 Present, no Murmur,  Respiratory: good respiratory effort, Bilateral Air entry equal and Decreased, no Crackles, no wheezes Abdomen: Bowel Sound present, Soft and no tenderness,  Skin: no rashes Extremities: no Pedal edema, no calf tenderness Neurologic: without any new focal findings, AAO x 1 Gait not checked due to patient safety concerns  Vitals:   07/19/22 1536 07/19/22 1931 07/20/22 0352 07/20/22 0831  BP: (!) 110/56 108/82 (!) 108/57 100/69  Pulse: 74 91 85 93  Resp: 16 17 15 18  $ Temp: 97.8 F (36.6 C) 99.3 F (37.4 C) 98.5 F (36.9 C) (!) 97.1 F (36.2 C)  TempSrc:  Oral Oral   SpO2:  98% 95% 96%  Weight:      Height:        Intake/Output Summary (Last 24 hours) at 07/20/2022 1258 Last data filed at 07/20/2022 [id number] Gross per 24 hour  Intake 300 ml  Output 2175 ml  Net -1875 ml   Filed Weights   06/17/22 1549 07/08/22 1841  Weight: 86 kg 84 kg    Data Reviewed: I have personally reviewed and interpreted daily labs, tele strips,  imagings as discussed above. I reviewed all nursing notes, pharmacy notes, vitals, pertinent old records I have discussed plan of care as described above with RN and patient/family.  CBC: Recent Labs  Lab 07/16/22 0559 07/17/22 0407 07/18/22 2100 07/19/22 0256 07/20/22 0715  WBC 4.9 5.0 5.0 5.7 5.1  HGB 11.5* 11.9* 11.3* 10.6* 11.3*  HCT 34.8* 36.1* 33.7* 32.0* 34.2*  MCV 101.2* 99.7 99.1 100.9* 99.7  PLT 188 179 170 169 0000000   Basic Metabolic Panel: Recent Labs  Lab 07/16/22 0559 07/17/22 0407 07/18/22 2100 07/19/22 0256 07/20/22 0715  NA 135 136 133* 136 134*  K 4.4 4.3 4.6 4.2 4.5  CL 105 106 105 109 106  CO2 22 23 21* 22 22  GLUCOSE 152* 290* 474* 147* 291*  BUN 39* 43* 30* 28* 31*  CREATININE 1.35* 1.50* 1.41* 1.32*  1.34*  CALCIUM 8.9 8.9 8.6* 8.5* 8.7*    Studies: No results found.  Scheduled Meds:  apixaban  5 mg Oral BID   atorvastatin  40 mg Oral Daily   Chlorhexidine Gluconate Cloth  6 each Topical Daily   feeding supplement (GLUCERNA SHAKE)  237 mL Oral TID BM   ferrous sulfate  325 mg Oral Q breakfast   gabapentin  300 mg Oral Daily   insulin aspart  0-15 Units Subcutaneous TID WC   insulin aspart  0-5 Units Subcutaneous QHS   insulin aspart  5 Units Subcutaneous TID WC   insulin glargine-yfgn  24 Units Subcutaneous QHS   melatonin  5 mg Oral QHS   metoprolol tartrate  25 mg Oral BID   nicotine  21 mg Transdermal Daily   Followed by   Derrill Memo ON 07/30/2022] nicotine  14 mg Transdermal Daily   Followed by   Derrill Memo ON 08/06/2022] nicotine  7 mg Transdermal Daily   QUEtiapine  25 mg Oral BID   tamsulosin  0.4 mg Oral QPC supper   traZODone  25 mg Oral QHS   Continuous Infusions:    PRN Meds: acetaminophen **OR** acetaminophen, hydrALAZINE, hydrALAZINE, loperamide, LORazepam **OR** LORazepam, ondansetron **OR** ondansetron (ZOFRAN) IV, witch hazel-glycerin  Time spent: 25 minutes  Author: Val Riles. MD [facility] 07/20/2022 12:58 PM  To reach On-call, see care teams to locate the attending and reach out to them via [email or web address]. If 7PM-7AM, please contact night-coverage If you still have difficulty reaching the attending provider, please page the [facility] (Director on Call) for [facility] on amion for assistance.

## 2022-07-21 DIAGNOSIS — N179 Acute kidney failure, unspecified: Secondary | ICD-10-CM | POA: Diagnosis not present

## 2022-07-21 LAB — GLUCOSE, CAPILLARY
Glucose-Capillary: 339 mg/dL — ABNORMAL HIGH (ref 70–99)
Glucose-Capillary: 269 mg/dL — ABNORMAL HIGH (ref 70–99)
Glucose-Capillary: 112 mg/dL — ABNORMAL HIGH (ref 70–99)
Glucose-Capillary: 272 mg/dL — ABNORMAL HIGH (ref 70–99)

## 2022-07-21 MED ORDER — INSULIN GLARGINE-YFGN 100 UNIT/ML ~~LOC~~ SOLN
28.0000 [IU] | Freq: Every day | SUBCUTANEOUS | Status: DC
  Administered 2022-07-21 – 2022-07-22 (×2): 28 [IU] via SUBCUTANEOUS
  Filled 2022-07-21 (×2): qty 0.28

## 2022-07-21 MED ORDER — INSULIN ASPART 100 UNIT/ML IJ SOLN
7.0000 [IU] | Freq: Three times a day (TID) | INTRAMUSCULAR | Status: DC
  Administered 2022-07-21 – 2022-07-22 (×2): 7 [IU] via SUBCUTANEOUS
  Filled 2022-07-21 (×2): qty 1

## 2022-07-21 NOTE — Inpatient Diabetes Management (Signed)
Inpatient Diabetes Program Recommendations  AACE/ADA: New Consensus Statement on Inpatient Glycemic Control (2015)  Target Ranges:  Prepandial:   less than 140 mg/dL      Peak postprandial:   less than 180 mg/dL (1-2 hours)      Critically ill patients:  140 - 180 mg/dL    Latest Reference Range & Units 07/20/22 08:27 07/20/22 11:30 07/20/22 16:16 07/20/22 16:43 07/20/22 20:47  Glucose-Capillary 70 - 99 mg/dL 254 (H)  13 units Novolog  333 (H)  16 units Novolog  140 (H) 131 (H)  7 units Novolog  170 (H)    24 units Semglee  (H): Data is abnormally high  Latest Reference Range & Units 07/21/22 07:54  Glucose-Capillary 70 - 99 mg/dL 339 (H)  16 units Novolog   (H): Data is abnormally high    Home DM Meds: Metformin 1000 mg BID     Actos 30 mg daily     Jardiance 25 mg daily     Amaryl 4 mg daily       Current Orders: Semglee 24 units QHS                            Novolog Moderate Correction Scale/ SSI (0-15 units) TID AC + HS                            Novolog 5 units TID with meals       MD- Please consider:   1. Increase Semglee slightly to 28 units QHS (~20% increase)   2. Increase Novolog Meal Coverage to 7 units TID with meals         --Will follow patient during hospitalization--   Wyn Quaker RN, MSN, [place] Diabetes Coordinator Inpatient Glycemic Control Team

## 2022-07-21 NOTE — Progress Notes (Signed)
[facility] Progress Note  Patient: Mitchell Price    [id number]  DOA: 06/17/2022     Date of Service: the patient was seen and examined on 07/21/2022  Chief Complaint  Patient presents with   Fall   Brief hospital course: Mitchell Price is a 77 y.o. male with a PMH significant for type  2 diabetes mellitus, essential hypertension, history of DVT on Eliquis, history of PUD, diverticulosis, type diabetes mellitus and dyslipidemia.   They presented from ED to the ED on 06/17/2022 with intractable diarrhea x 1 days. He had been boarding in the ED awaiting long-term placement when he developed intractable diarrhea with incontinence. The dehydration that resulted contributed to an AKI.    In the ED, it was found that they had BP of 133/84 with temperature of 98, heart rate of 83 and respiratory rate of 20 with pulse oximetry of 96% on room air.  Significant findings included:  BUN of 41 and creatinine 1.52 compared to 22/1.11 on 1/10 and blood glucose of 237.  CBC showed hemoglobin 12.3 and hematocrit 37.8 with WBC of 5.5 and platelets 249.  Previous H&H were close. EKG On 1/10 showed normal sinus rhythm with a rate of 81 with left axis deviation and prolonged QT interval with QTc of 480 MS. Imaging: Most recent two-view chest x-ray on 1/10 showed linear scarring or atelectasis at the right lung base.  Abdominal pelvic CT scan this morning revealed bilateral hyperdense renal lesions that may be hemorrhagic/proteinaceous cysts with no acute abnormality in the abdomen or pelvis.  It showed cholelithiasis and body wall edema.  There was a recommendation for further follow-up with nonemergent MRI of the abdomen with and without contrast to rule out malignancy.   They were initially treated with 4 mg of IV Zofran and 1 L bolus of IV lactated Ringer and stool pathogen panel was ordered. Patient was not agreeable to sample collection.    Patient was admitted to medicine service for further  workup and management of diarrhea as outlined in detail below.   Assessment and Plan: Principal Problem:   AKI (acute kidney injury) ([place]) Active Problems:   Memory changes   Intractable diarrhea   Uncontrolled type 2 diabetes mellitus with hyperglycemia, with long-term current use of insulin (HCC)   Dyslipidemia   History of DVT (deep vein thrombosis)   Essential hypertension   Diabetic neuropathy (HCC)   # AKI, prerenal due to dehydration secondary to diarrhea Cr 1.52--1.3--1.14--1.35  gradually improving 2/4 decreased IVF NS 50 ml/hr, discontinued IV fluid on 2/6 RN was advised to encourage for oral fluid intake  2/6 urinary retention, Foley catheter was inserted, 1200 ml urine was collected.  Keep Foley for 1 week and then DC for voiding trial on 2/14 started Flomax. - repeat BMP am 2/9 Cr 1.5 slightly elevated, started NS 75 mill per hour for overnight hydration 2/12 Cr 1.34 stable  # Intractable diarrhea- Resolved  His abdominal exam is benign and abdominal CT scan showed no acute abdominal or pelvic abnormalities. Stool sample was not sent for GIP, diarrhea resolved. continue to monitor hydration status. S/p Florastor for 5 days  # Dementia- patient is oriented to self only and poorly redirected. His son stated that he is at his baseline. Will need placement - TOC engaged for placement - 1:1 sitter - delirium precautions - seroquel  - PRN haldol, ativan   # Bed bugs- found after admission. Room/fabrics cleaned/replaced   # Uncontrolled type 2 diabetes  mellitus with hyperglycemia, with long-term current use of insulin (HCC) On 2/2 patient had an episode of hypoglycemia, discontinued Semglee and discontinued home oral antidiabetic medications. Resolved S/p hypoglycemia protocol Continue NovoLog sliding scale, continue diabetic diet. 2/13 Increased  Semglee 28 units nightly, and NovoLog 7 units 3 times daily diabetic coordinator is following Monitor FSBG.    Dyslipidemia - continue statin therapy.   Essential hypertension-  -  hold off lisinopril given his acute kidney injury. 2/2 started Lopressor with holding parameters. Use hydralazine po or IV as per BP parameters  History of DVT (deep vein thrombosis) - continue Eliquis    Diabetic neuropathy - continue Neurontin.  Insomnia, started melatonin 5 mg p.o. nightly and trazodone 25 mg p.o. nightly  Nicotine dependence, patient smokes cigarettes daily.  Nicotine patch was ordered but patient is refusing.  Smoking cessation counseling done.  Body mass index is 21.96 kg/m.  Interventions:   On admission patient had severe dehydration and low appetite, developed AKI due to significant diarrhea.   On 2/1 patient continued to have diarrhea, poor appetite and patient required IV fluid.   On 2/2 diarrhea resolved, AKI resolved, appetite is improving.  Decreased IV fluid NS 50 mL/h for gentle hydration, patient has significant dementia and may not drink enough water to keep himself hydrated. 2/5 d/c'd IVF   Diet: Carb modified diet DVT Prophylaxis: Therapeutic Anticoagulation with Eliquis    Advance goals of care discussion: Full code  Family Communication: family was not present at bedside, at the time of interview.  The pt provided permission to discuss medical plan with the family. Opportunity was given to ask question and all questions were answered satisfactorily.  Patient has significant dementia AAO x 1  Disposition:  Pt is from Home, admitted with worsening of dementia, awaiting in the ED for placement, developed diarrhea, dehydration and AKI so admitted as an inpatient for IV hydration.  AKI resolved, appetite improved, diarrhea resolved, had urinary retention, Foley catheter was inserted on 2/6.  Patient is clinically stable, medically optimized to discharge, awaiting placement. Discharge to LTC placement, when bed available..  Subjective: No significant events overnight,  patient was sitting comfortably on the bed, patient was requesting for going out for smoking.  Smoking cessation counseling done.  Patient was offered nicotine patch but he does not want nicotine patch. Denies any other active issues.  Physical Exam: General: NAD, lying comfortably in the bed  Appear in no distress, affect appropriate Eyes: sleepy ENT: Oral Mucosa Clear, moist  Neck: no JVD,  Cardiovascular: S1 and S2 Present, no Murmur,  Respiratory: good respiratory effort, Bilateral Air entry equal and Decreased, no Crackles, no wheezes Abdomen: Bowel Sound present, Soft and no tenderness,  Skin: no rashes Extremities: no Pedal edema, no calf tenderness Neurologic: without any new focal findings, AAO x 1 Gait not checked due to patient safety concerns  Vitals:   07/20/22 1511 07/20/22 2131 07/21/22 0320 07/21/22 0757  BP: 114/66 135/72 116/69 116/81  Pulse: 77 86 84 82  Resp: 18 18 18 18  $ Temp: 98.9 F (37.2 C) 98.6 F (37 C) 98.3 F (36.8 C) 99.1 F (37.3 C)  TempSrc: Oral Oral Oral   SpO2: 98% 98% 100% 98%  Weight:      Height:        Intake/Output Summary (Last 24 hours) at 07/21/2022 1134 Last data filed at 07/21/2022 0800 Gross per 24 hour  Intake 480 ml  Output 1750 ml  Net -1270 ml  Filed Weights   06/17/22 1549 07/08/22 1841  Weight: 86 kg 84 kg    Data Reviewed: I have personally reviewed and interpreted daily labs, tele strips, imagings as discussed above. I reviewed all nursing notes, pharmacy notes, vitals, pertinent old records I have discussed plan of care as described above with RN and patient/family.  CBC: Recent Labs  Lab 07/16/22 0559 07/17/22 0407 07/18/22 2100 07/19/22 0256 07/20/22 0715  WBC 4.9 5.0 5.0 5.7 5.1  HGB 11.5* 11.9* 11.3* 10.6* 11.3*  HCT 34.8* 36.1* 33.7* 32.0* 34.2*  MCV 101.2* 99.7 99.1 100.9* 99.7  PLT 188 179 170 169 0000000   Basic Metabolic Panel: Recent Labs  Lab 07/16/22 0559 07/17/22 0407 07/18/22 2100  07/19/22 0256 07/20/22 0715  NA 135 136 133* 136 134*  K 4.4 4.3 4.6 4.2 4.5  CL 105 106 105 109 106  CO2 22 23 21* 22 22  GLUCOSE 152* 290* 474* 147* 291*  BUN 39* 43* 30* 28* 31*  CREATININE 1.35* 1.50* 1.41* 1.32* 1.34*  CALCIUM 8.9 8.9 8.6* 8.5* 8.7*    Studies: No results found.  Scheduled Meds:  apixaban  5 mg Oral BID   atorvastatin  40 mg Oral Daily   Chlorhexidine Gluconate Cloth  6 each Topical Daily   feeding supplement (GLUCERNA SHAKE)  237 mL Oral TID BM   ferrous sulfate  325 mg Oral Q breakfast   gabapentin  300 mg Oral Daily   insulin aspart  0-15 Units Subcutaneous TID WC   insulin aspart  0-5 Units Subcutaneous QHS   insulin aspart  5 Units Subcutaneous TID WC   insulin glargine-yfgn  28 Units Subcutaneous QHS   melatonin  5 mg Oral QHS   metoprolol tartrate  25 mg Oral BID   nicotine  21 mg Transdermal Daily   Followed by   Derrill Memo ON 07/30/2022] nicotine  14 mg Transdermal Daily   Followed by   Derrill Memo ON 08/06/2022] nicotine  7 mg Transdermal Daily   QUEtiapine  25 mg Oral BID   tamsulosin  0.4 mg Oral QPC supper   traZODone  25 mg Oral QHS   Continuous Infusions:    PRN Meds: acetaminophen **OR** acetaminophen, hydrALAZINE, hydrALAZINE, loperamide, LORazepam **OR** LORazepam, ondansetron **OR** ondansetron (ZOFRAN) IV, witch hazel-glycerin  Time spent: 25 minutes  Author: Val Riles. MD [facility] 07/21/2022 11:34 AM  To reach On-call, see care teams to locate the attending and reach out to them via [email or web address]. If 7PM-7AM, please contact night-coverage If you still have difficulty reaching the attending provider, please page the [facility] (Director on Call) for [facility] on amion for assistance.

## 2022-07-21 NOTE — Progress Notes (Signed)
Mobility Specialist - Progress Note   07/21/22 1000  Mobility  Activity Stood at bedside  Level of Assistance Modified independent, requires aide device or extra time  Assistive Device None  Activity Response Tolerated well  $Mobility charge 1 Mobility     Pt sitting EOB on arrival, utilizing RA. Pt completed 4 STS without assistance, no AD. Declined transfer to chair or ambulation at this time despite max encouragement. Pt returned to EOB with alarm set, needs in reach.    Kathee Delton Mobility Specialist 07/21/22, 10:24 AM

## 2022-07-22 DIAGNOSIS — N179 Acute kidney failure, unspecified: Secondary | ICD-10-CM | POA: Diagnosis not present

## 2022-07-22 LAB — BASIC METABOLIC PANEL
Anion gap: 8 (ref 5–15)
BUN: 33 mg/dL — ABNORMAL HIGH (ref 8–23)
CO2: 23 mmol/L (ref 22–32)
Calcium: 9.1 mg/dL (ref 8.9–10.3)
Chloride: 107 mmol/L (ref 98–111)
Creatinine, Ser: 1.24 mg/dL (ref 0.61–1.24)
GFR, Estimated: 60 mL/min — ABNORMAL LOW (ref >60)
Glucose, Bld: 202 mg/dL — ABNORMAL HIGH (ref 70–99)
Potassium: 4.6 mmol/L (ref 3.5–5.1)
Sodium: 138 mmol/L (ref 135–145)

## 2022-07-22 LAB — GLUCOSE, CAPILLARY
Glucose-Capillary: 188 mg/dL — ABNORMAL HIGH (ref 70–99)
Glucose-Capillary: 319 mg/dL — ABNORMAL HIGH (ref 70–99)
Glucose-Capillary: 90 mg/dL (ref 70–99)
Glucose-Capillary: 153 mg/dL — ABNORMAL HIGH (ref 70–99)

## 2022-07-22 LAB — CBC
HCT: 35.8 % — ABNORMAL LOW (ref 39.0–52.0)
Hemoglobin: 11.8 g/dL — ABNORMAL LOW (ref 13.0–17.0)
MCH: 32.5 pg (ref 26.0–34.0)
MCHC: 33 g/dL (ref 30.0–36.0)
MCV: 98.6 fL (ref 80.0–100.0)
Platelets: 197 10*3/uL (ref 150–400)
RBC: 3.63 MIL/uL — ABNORMAL LOW (ref 4.22–5.81)
RDW: 12.3 % (ref 11.5–15.5)
WBC: 4.4 10*3/uL (ref 4.0–10.5)
nRBC: 0 % (ref 0.0–0.2)

## 2022-07-22 MED ORDER — GLIMEPIRIDE 4 MG PO TABS: 4.0000 mg | ORAL_TABLET | ORAL | 0 refills | Status: AC

## 2022-07-22 MED ORDER — DOCUSATE SODIUM 100 MG PO CAPS: 100.0000 mg | ORAL_CAPSULE | Freq: Two times a day (BID) | ORAL | 0 refills | Status: AC | PRN

## 2022-07-22 MED ORDER — LORAZEPAM 2 MG/ML IJ SOLN
2.0000 mg | Freq: Once | INTRAMUSCULAR | Status: AC
  Administered 2022-07-22: 2 mg via INTRAVENOUS
  Filled 2022-07-22: qty 1

## 2022-07-22 MED ORDER — INSULIN ASPART 100 UNIT/ML IJ SOLN
9.0000 [IU] | Freq: Three times a day (TID) | INTRAMUSCULAR | Status: DC
  Administered 2022-07-22 – 2022-07-23 (×2): 9 [IU] via SUBCUTANEOUS
  Filled 2022-07-22 (×2): qty 1

## 2022-07-22 MED ORDER — TAMSULOSIN HCL 0.4 MG PO CAPS: 0.4000 mg | ORAL_CAPSULE | Freq: Every day | ORAL | 0 refills | Status: DC

## 2022-07-22 MED ORDER — METFORMIN HCL 1000 MG PO TABS: 1000.0000 mg | ORAL_TABLET | Freq: Two times a day (BID) | ORAL | 0 refills | Status: AC

## 2022-07-22 MED ORDER — QUETIAPINE FUMARATE 25 MG PO TABS: 25.0000 mg | ORAL_TABLET | Freq: Two times a day (BID) | ORAL | 0 refills | Status: AC

## 2022-07-22 MED ORDER — MELATONIN 5 MG PO TABS: 5.0000 mg | ORAL_TABLET | Freq: Every day | ORAL | 0 refills | Status: AC

## 2022-07-22 MED ORDER — HALOPERIDOL LACTATE 5 MG/ML IJ SOLN
2.0000 mg | Freq: Four times a day (QID) | INTRAMUSCULAR | Status: DC | PRN
  Administered 2022-07-22: 2 mg via INTRAVENOUS
  Filled 2022-07-22: qty 1

## 2022-07-22 MED ORDER — ACETAMINOPHEN 500 MG PO TABS: 500.0000 mg | ORAL_TABLET | Freq: Four times a day (QID) | ORAL | 0 refills | Status: AC | PRN

## 2022-07-22 MED ORDER — PIOGLITAZONE HCL 45 MG PO TABS: 45.0000 mg | ORAL_TABLET | Freq: Every day | ORAL | 0 refills | Status: AC

## 2022-07-22 MED ORDER — METOPROLOL TARTRATE 25 MG PO TABS: 25.0000 mg | ORAL_TABLET | Freq: Two times a day (BID) | ORAL | 0 refills | Status: AC

## 2022-07-22 MED ORDER — EMPAGLIFLOZIN 25 MG PO TABS: 25.0000 mg | ORAL_TABLET | Freq: Every day | ORAL | 0 refills | Status: AC

## 2022-07-22 MED ORDER — TRAZODONE HCL 50 MG PO TABS: 25.0000 mg | ORAL_TABLET | Freq: Every day | ORAL | 0 refills | Status: AC

## 2022-07-22 MED ORDER — GABAPENTIN 300 MG PO CAPS: 300.0000 mg | ORAL_CAPSULE | Freq: Every day | ORAL | 0 refills | Status: AC

## 2022-07-22 MED ORDER — ATORVASTATIN CALCIUM 40 MG PO TABS: 40.0000 mg | ORAL_TABLET | Freq: Every day | ORAL | 0 refills | Status: AC

## 2022-07-22 MED ORDER — APIXABAN 5 MG PO TABS: 5.0000 mg | ORAL_TABLET | Freq: Two times a day (BID) | ORAL | 0 refills | Status: AC

## 2022-07-22 MED ORDER — FERROUS SULFATE 325 (65 FE) MG PO TBEC: 325.0000 mg | DELAYED_RELEASE_TABLET | Freq: Every day | ORAL | 0 refills | Status: AC

## 2022-07-22 NOTE — Progress Notes (Signed)
Mobility Specialist - Progress Note   07/22/22 1205  Mobility  Activity Stood at bedside  Level of Assistance Independent  Range of Motion/Exercises Active  $Mobility charge 1 Mobility     Pt slouched over in recliner upon arrival, utilizing RA. Pt declined ambulation this date, but participated in seated therex and 3 STS independently. No complaints. Pt left in chair with alarm set, needs in reach   [place] Specialist 07/22/22, 12:06 PM

## 2022-07-22 NOTE — TOC Progression Note (Signed)
Transition of Care [facility]) - Progression Note    Patient Details  Name: Mitchell Price MRN: [id number] Date of Birth: [birth year]  [facility] CM/SW Contact  Beverly Sessions, RN Phone Number: 07/22/2022, 11:39 AM  Clinical Narrative:    Secure Email sent to [facility] with [facility]  to ensure he has everything needed to admit tomorrow, and that they will be providing transportation.   He request that all discharge medication be sent to outpatient pharmacy today.  MD notified    Expected Discharge Plan: Assisted Living Barriers to Discharge: ED Unsafe disposition  Expected Discharge Plan and Services   Discharge Planning Services: CM Consult   Living arrangements for the past 2 months: Single Family Home                 DME Arranged: N/A DME Agency: NA                   Social Determinants of Health (SDOH) Interventions SDOH Screenings   Tobacco Use: High Risk (07/08/2022)    Readmission Risk Interventions     No data to display

## 2022-07-22 NOTE — NC FL2 (Signed)
[place] LEVEL OF CARE FORM     IDENTIFICATION  Patient Name: Mitchell Price Birthdate: 12-Aug-1945 Sex: male Admission Date (Current Location): 06/17/2022  [facility] and [place] Number:  Engineering geologist and Address:  [facility], [address] [place], [place] [address]      Provider Number: [id number]  Attending Physician Name and Address:  Val Riles, MD  Relative Name and Phone Number:  [name]- son- [phone]    Current Level of Care: Hospital Recommended Level of Care: [facility] Prior Approval Number:    Date Approved/Denied:   PASRR Number:    Discharge Plan: Other (Comment) (alf/family care home)    Current Diagnoses: Patient Active Problem List   Diagnosis Date Noted   Altered mental status 07/09/2022   Fall 07/09/2022   Hyperglycemia 07/09/2022   Diarrhea of infectious origin 07/09/2022   Intractable diarrhea 07/08/2022   Uncontrolled type 2 diabetes mellitus with hyperglycemia, with long-term current use of insulin ([place]) 07/08/2022   Diabetic neuropathy ([place]) 07/08/2022   Dyslipidemia 07/08/2022   History of DVT (deep vein thrombosis) 07/08/2022   Essential hypertension 07/08/2022   Memory changes 06/19/2022   CKD (chronic kidney disease) stage 3, GFR 30-59 ml/min ([place]) 09/12/2019   Melena 09/12/2019   Acute blood loss anemia 09/12/2019   History of hemorrhoids 09/12/2019   Type 2 diabetes mellitus with stage 3 chronic kidney disease ([place]) 09/12/2019   AKI (acute kidney injury) ([place]) 09/12/2019   PUD (peptic ulcer disease) 09/12/2019   HTN (hypertension) 09/12/2019   Upper GI bleed 09/12/2019   Hyperglycemia due to type 2 diabetes mellitus ([place]) 09/12/2019   BPH (benign prostatic hyperplasia) 01/17/2019   HLD (hyperlipidemia) 01/17/2019   Multiple thyroid nodules 07/18/2017   Grade III hemorrhoids    Acute GI bleeding 07/13/2017   GIB (gastrointestinal bleeding) 07/12/2017    Acidosis, hyperchloremic 12/13/2015   Erectile dysfunction due to arterial insufficiency 04/13/2014   Diverticulosis 08/25/2013   Internal hemorrhoid, bleeding 08/25/2013   Diabetes type 2, uncontrolled 08/22/2013   Renal mass 08/21/2013   Lens replaced by other means 01/26/2013   Status post cataract extraction 01/26/2013   Anemia 04/01/2012    Orientation RESPIRATION BLADDER Height & Weight     Self, Place  Normal Continent Weight: 84 kg Height:  6' 5"$  (195.6 cm)  BEHAVIORAL SYMPTOMS/MOOD NEUROLOGICAL BOWEL NUTRITION STATUS  Wanderer   Continent Diet (regular)  AMBULATORY STATUS COMMUNICATION OF NEEDS Skin   Independent Verbally Normal                       Personal Care Assistance Level of Assistance  Bathing, Feeding, Dressing Bathing Assistance: Limited assistance Feeding assistance: Limited assistance Dressing Assistance: Independent     Functional Limitations Info  Sight, Hearing, Speech Sight Info: Adequate Hearing Info: Adequate Speech Info: Adequate    SPECIAL CARE FACTORS FREQUENCY                       Contractures Contractures Info: Not present    Additional Factors Info  Code Status, Allergies Code Status Info: full Allergies Info: NKDA           TAKE these medications     acetaminophen 500 MG tablet Commonly known as: TYLENOL Take 1 tablet (500 mg total) by mouth every 6 (six) hours as needed for mild pain.    apixaban 5 MG Tabs tablet Commonly known as: Eliquis Take 1  tablet (5 mg total) by mouth 2 (two) times daily. TAKE 1 TABLET(5 MG) BY MOUTH TWICE DAILY What changed:  how much to take how to take this when to take this    atorvastatin 40 MG tablet Commonly known as: LIPITOR Take 1 tablet (40 mg total) by mouth daily.    docusate sodium 100 MG capsule Commonly known as: COLACE Take 1 capsule (100 mg total) by mouth 2 (two) times daily as needed for mild constipation.    empagliflozin 25 MG Tabs tablet Commonly  known as: JARDIANCE Take 1 tablet (25 mg total) by mouth daily.    ferrous sulfate 325 (65 FE) MG EC tablet Take 1 tablet (325 mg total) by mouth daily with breakfast.    gabapentin 300 MG capsule Commonly known as: NEURONTIN Take 1 capsule (300 mg total) by mouth daily.    glimepiride 4 MG tablet Commonly known as: AMARYL Take 1 tablet (4 mg total) by mouth every morning.    melatonin 5 MG Tabs Take 1 tablet (5 mg total) by mouth at bedtime.    metFORMIN 1000 MG tablet Commonly known as: GLUCOPHAGE Take 1 tablet (1,000 mg total) by mouth 2 (two) times daily.    metoprolol tartrate 25 MG tablet Commonly known as: LOPRESSOR Take 1 tablet (25 mg total) by mouth 2 (two) times daily.    pioglitazone 45 MG tablet Commonly known as: ACTOS Take 1 tablet (45 mg total) by mouth daily.    QUEtiapine 25 MG tablet Commonly known as: SEROQUEL Take 1 tablet (25 mg total) by mouth 2 (two) times daily.    tamsulosin 0.4 MG Caps capsule Commonly known as: FLOMAX Take 1 capsule (0.4 mg total) by mouth daily after supper.    traZODone 50 MG tablet Commonly known as: DESYREL Take 0.5 tablets (25 mg total) by mouth at bedtime   Relevant Imaging Results:  Relevant Lab Results:   Additional Information    Beverly Sessions, RN

## 2022-07-22 NOTE — Discharge Summary (Signed)
[facility] Discharge Summary   Patient: Mitchell Price [id number]  PCP: Doretha Imus, PA-C  Date of admission: 06/17/2022   Date of discharge:  07/23/2022     Discharge Diagnoses:  Principal Problem:   AKI (acute kidney injury) ([place]) Active Problems:   Memory changes   Intractable diarrhea   Uncontrolled type 2 diabetes mellitus with hyperglycemia, with long-term current use of insulin ([facility])   Dyslipidemia   History of DVT (deep vein thrombosis)   Essential hypertension   Diabetic neuropathy ([place])   Altered mental status   Fall   Hyperglycemia   Diarrhea of infectious origin   Admitted From: Home Disposition:  ALF/ILF   Recommendations for Outpatient Follow-up:  PCP: in 1 wk Follow-up with urology in 1 to 2 weeks, For BPH/urinary retention management as an outpatient Follow up LABS/TEST:  A1c in 4 weeks, fingerstick blood glucose to monitor sugar level, if uncontrolled then patient can be started on insulin.   Diet recommendation: Carb modified diet  Activity: The patient is advised to gradually reintroduce usual activities, as tolerated  Discharge Condition: stable  Code Status: Full code   History of present illness: As per the H and P dictated on admission  Hospital Course:  Mitchell Price is a 77 y.o. male with a PMH significant for type  2 diabetes mellitus, essential hypertension, history of DVT on Eliquis, history of PUD, diverticulosis, type diabetes mellitus and dyslipidemia. They presented from ED to the ED on 06/17/2022 with intractable diarrhea x 1 days. He had been boarding in the ED awaiting long-term placement when he developed intractable diarrhea with incontinence. The dehydration that resulted contributed to an AKI.  In the ED, it was found that they had BP of 133/84 with temperature of 98, heart rate of 83 and respiratory rate of 20 with pulse oximetry of 96% on room air.  Significant findings included:  BUN of 41 and creatinine 1.52  compared to 22/1.11 on 1/10 and blood glucose of 237.  CBC showed hemoglobin 12.3 and hematocrit 37.8 with WBC of 5.5 and platelets 249.  Previous H&H were close. EKG On 1/10 showed normal sinus rhythm with a rate of 81 with left axis deviation and prolonged QT interval with QTc of 480 MS. Imaging: Most recent two-view chest x-ray on 1/10 showed linear scarring or atelectasis at the right lung base.  Abdominal pelvic CT scan this morning revealed bilateral hyperdense renal lesions that may be hemorrhagic/proteinaceous cysts with no acute abnormality in the abdomen or pelvis.  It showed cholelithiasis and body wall edema.  There was a recommendation for further follow-up with nonemergent MRI of the abdomen with and without contrast to rule out malignancy. They were initially treated with 4 mg of IV Zofran and 1 L bolus of IV lactated Ringer and stool pathogen panel was ordered. Patient was not agreeable to sample collection.  Patient was admitted to medicine service for further workup and management of diarrhea as outlined in detail below.   Assessment and Plan:  # AKI, prerenal due to dehydration secondary to diarrhea Cr 1.52--1.24 now, gradually improved and AKI resolved.  Patient was given IV fluid which was decreased gradually, encouraged oral intake. On 2/6 patient had urinary retention, Foley catheter was inserted, 1200 ml urine was collected. started Flomax. Keept Foley for 1 week and then Dc'd on 2/14, but Patient failed a voiding trial so Foley catheter was reinserted on 2/14.  Continue Foley for 1 to 2 weeks and then follow  voiding trial.  Follow with urology in 1 to 2 weeks as an outpatient for further management. # Intractable diarrhea- Resolved. Ct a/p did not show any acute findings. Stool sample was not sent for GIP, diarrhea resolved. S/p Florastor for 5 days # Dementia- patient is oriented to self only and poorly redirected. His son stated that he is at his baseline and needs placement.   Started Seroquel and trazodone.  Patient is tolerating well.  No behavioral issues.  Started melatonin as well for sleep. # Bed bugs- found after admission. Room/fabrics cleaned/replaced # Uncontrolled type 2 diabetes mellitus with hyperglycemia, with long-term current use of insulin. On 2/2 patient had an episode of hypoglycemia, discontinued Semglee and discontinued home oral antidiabetic medications. Resolved S/p hypoglycemia protocol Continue diabetic diet.  Patient was on insulin during hospital stay. Semglee 28 units nightly, and NovoLog 7 units 3 times daily and NovoLog sliding scale.  Resumed oral diabetic medications on discharge.  Continue to monitor fingerstick blood glucose and if blood glucose is uncontrolled then consider starting on insulin.  # Dyslipidemia, continue statin therapy. # Essential hypertension; started Lopressor.  Monitor BP and titrate medications accordingly. # History of DVT (deep vein thrombosis), continue Eliquis  # Diabetic neuropathy, continue Neurontin. # Insomnia, started melatonin 5 mg p.o. nightly and trazodone 25 mg p.o. nightly # Nicotine dependence, patient smokes cigarettes daily.  Nicotine patch was ordered but patient is refusing.  Smoking cessation counseling done. Body mass index is 21.96 kg/m.  Interventions:    - Patient was instructed, not to drive, operate heavy machinery, perform activities at heights, swimming or participation in water activities or provide baby sitting services while on Pain, Sleep and Anxiety Medications; until his outpatient Physician has advised to do so again.  - Also recommended to not to take more than prescribed Pain, Sleep and Anxiety Medications.  Patient was ambulatory without any assistance. Patient was able to ambulate with mobility tech without any needs for PT and OT. On the day of the discharge the patient's vitals were stable, and no other acute medical condition were reported by patient. the patient was felt  safe to be discharge at ALF/ILF.  Consultants: Psychiatrist Procedures: None  Discharge Exam: General: Appear in no distress, no Rash; Oral Mucosa Clear, moist. Cardiovascular: S1 and S2 Present, no Murmur, Respiratory: normal respiratory effort, Bilateral Air entry present and no Crackles, no wheezes Abdomen: Bowel Sound present, Soft and no tenderness, no hernia Extremities: no Pedal edema, no calf tenderness Neurology: alert and oriented to time, place, and person affect appropriate.  Filed Weights   06/17/22 1549 07/08/22 1841  Weight: 86 kg 84 kg   Vitals:   07/23/22 0619 07/23/22 0724  BP: 137/76 (!) 114/57  Pulse: 95 100  Resp: 18 20  Temp: 99 F (37.2 C) 99.2 F (37.3 C)  SpO2: 96% 96%    DISCHARGE MEDICATION: Allergies as of 07/23/2022   No Known Allergies      Medication List     TAKE these medications    acetaminophen 500 MG tablet Commonly known as: TYLENOL Take 1 tablet (500 mg total) by mouth every 6 (six) hours as needed for mild pain.   apixaban 5 MG Tabs tablet Commonly known as: Eliquis Take 1 tablet (5 mg total) by mouth 2 (two) times daily. TAKE 1 TABLET(5 MG) BY MOUTH TWICE DAILY What changed:  how much to take how to take this when to take this   atorvastatin 40 MG tablet Commonly known  as: LIPITOR Take 1 tablet (40 mg total) by mouth daily.   docusate sodium 100 MG capsule Commonly known as: COLACE Take 1 capsule (100 mg total) by mouth 2 (two) times daily as needed for mild constipation.   empagliflozin 25 MG Tabs tablet Commonly known as: JARDIANCE Take 1 tablet (25 mg total) by mouth daily.   ferrous sulfate 325 (65 FE) MG EC tablet Take 1 tablet (325 mg total) by mouth daily with breakfast.   gabapentin 300 MG capsule Commonly known as: NEURONTIN Take 1 capsule (300 mg total) by mouth daily.   glimepiride 4 MG tablet Commonly known as: AMARYL Take 1 tablet (4 mg total) by mouth every morning.   melatonin 5 MG  Tabs Take 1 tablet (5 mg total) by mouth at bedtime.   metFORMIN 1000 MG tablet Commonly known as: GLUCOPHAGE Take 1 tablet (1,000 mg total) by mouth 2 (two) times daily.   metoprolol tartrate 25 MG tablet Commonly known as: LOPRESSOR Take 1 tablet (25 mg total) by mouth 2 (two) times daily.   pioglitazone 45 MG tablet Commonly known as: ACTOS Take 1 tablet (45 mg total) by mouth daily.   QUEtiapine 25 MG tablet Commonly known as: SEROQUEL Take 1 tablet (25 mg total) by mouth 2 (two) times daily.   tamsulosin 0.4 MG Caps capsule Commonly known as: FLOMAX Take 1 capsule (0.4 mg total) by mouth daily after supper.   traZODone 50 MG tablet Commonly known as: DESYREL Take 0.5 tablets (25 mg total) by mouth at bedtime.       No Known Allergies Discharge Instructions     Call MD for:  difficulty breathing, headache or visual disturbances   Complete by: As directed    Call MD for:  extreme fatigue   Complete by: As directed    Call MD for:  persistant dizziness or light-headedness   Complete by: As directed    Call MD for:  persistant nausea and vomiting   Complete by: As directed    Call MD for:  severe uncontrolled pain   Complete by: As directed    Call MD for:  temperature >100.4   Complete by: As directed    Diet - low sodium heart healthy   Complete by: As directed    Discharge instructions   Complete by: As directed    Follow-up with PCP, patient should be seen by an MD in 2 to 3 days,Patient has urinary retention, Foley catheter was inserted on 07/22/2022. Discontinue Foley catheter after 1 to 2 weeks for voiding trial Follow-up with urology in 1 to 2 weeks for further management, started Flomax. Continue diabetic diet, monitor fingerstick blood glucose level and Start insulin if blood glucose is uncontrolled.   Increase activity slowly   Complete by: As directed        The results of significant diagnostics from this hospitalization (including imaging,  microbiology, ancillary and laboratory) are listed below for reference.    Significant Diagnostic Studies: US SCROTUM W/DOPPLER  Result Date: 07/18/2022 CLINICAL DATA:  [id number] Testicular discomfort [id number] EXAM: ULTRASOUND OF SCROTUM TECHNIQUE: Complete ultrasound examination of the testicles, epididymis, and other scrotal structures was performed. COMPARISON:  None Available. FINDINGS: The right testis measured 3.9 x 2.9 x 1.9 cm, and the left testis measured 3.7 x 2.8 x 1.8 cm. The testes demonstrated blood flow with Doppler. There is a cysts identified of the tunica albuginea on the left measuring 0.4 cm. Left epididymal cyst measures 0.4 cm. No fluid  collections or masses were seen. IMPRESSION: Cysts identified left epididymis and tunica albuginea. No acute scrotal pathology or testicular abnormalities. Electronically Signed   By: Sammie Bench M.D.   On: 07/18/2022 11:22   CT HEAD WO CONTRAST (5MM)  Result Date: 07/15/2022 CLINICAL DATA:  Head trauma. EXAM: CT HEAD WITHOUT CONTRAST TECHNIQUE: Contiguous axial images were obtained from the base of the skull through the vertex without intravenous contrast. RADIATION DOSE REDUCTION: This exam was performed according to the departmental dose-optimization program which includes automated exposure control, adjustment of the mA and/or kV according to patient size and/or use of iterative reconstruction technique. COMPARISON:  Head CT 06/23/2022. FINDINGS: Brain: No acute hemorrhage, mass effect or midline shift. Stable chronic small-vessel disease. Gray-white differentiation is otherwise preserved. No hydrocephalus. No extra-axial collection. Basilar cisterns are patent. Vascular: No hyperdense vessel or unexpected calcification. Skull: No calvarial fracture or suspicious bone lesion. Skull base is unremarkable. Sinuses/Orbits: Partial opacification of the ethmoid air cells. Mastoids are well aerated. Orbits are unremarkable. Other: No scalp hematoma.  IMPRESSION: 1. No acute intracranial injury or calvarial fracture. 2. Stable chronic small-vessel disease. Electronically Signed   By: Emmit Alexanders M.D.   On: 07/15/2022 08:55   CT ABDOMEN PELVIS W CONTRAST  Result Date: 07/08/2022 CLINICAL DATA:  Recurrent diarrhea and right-sided abdominal pain EXAM: CT ABDOMEN AND PELVIS WITH CONTRAST TECHNIQUE: Multidetector CT imaging of the abdomen and pelvis was performed using the standard protocol following bolus administration of intravenous contrast. RADIATION DOSE REDUCTION: This exam was performed according to the departmental dose-optimization program which includes automated exposure control, adjustment of the mA and/or kV according to patient size and/or use of iterative reconstruction technique. CONTRAST:  157m OMNIPAQUE IOHEXOL 300 MG/ML  SOLN COMPARISON:  None Available. FINDINGS: Lower chest: Advanced coronary artery calcification. Emphysema. Right basilar atelectasis/scarring. Hepatobiliary: Cholelithiasis. No biliary dilation. No solid hepatic lesion. Pancreas: Unremarkable. Spleen: Unremarkable. Adrenals/Urinary Tract: Normal adrenal glands. Multiple low-attenuation lesions in the kidneys bilaterally are compatible with benign cysts. Hyperdense lesion extending off the posterior right kidney measuring 3.2 cm and hyperdense lesion off the inferior pole of the left kidney measuring 2.6 cm may represent hemorrhagic/proteinaceous cyst however are indeterminate. No urinary calculi or hydronephrosis. Unremarkable bladder. Stomach/Bowel: Stomach is within normal limits. Appendix appears normal. Normal caliber large and small bowel. No wall thickening, inflammatory change, or sign of ischemia. Vascular/Lymphatic: Aortic atherosclerosis. No enlarged abdominal or pelvic lymph nodes. Reproductive: Enlarged prostate. Other: No free intraperitoneal fluid or air. Asymmetric focal fat deposition within the right retroperitoneum with minimal if any stranding.  Musculoskeletal: Body wall edema.  No acute osseous abnormality. IMPRESSION: No acute abnormality in the abdomen or pelvis. Bilateral hyperdense renal lesions may be hemorrhagic/proteinaceous cysts however further evaluation with nonemergent MRI with and without contrast is recommended to exclude malignancy. Cholelithiasis. Body wall edema. Electronically Signed   By: TPlacido SouM.D.   On: 07/08/2022 03:27    Microbiology: No results found for this or any previous visit (from the past 240 hour(s)).   Labs: CBC: Recent Labs  Lab 07/17/22 0407 07/18/22 2100 07/19/22 0256 07/20/22 0715 07/22/22 0754  WBC 5.0 5.0 5.7 5.1 4.4  HGB 11.9* 11.3* 10.6* 11.3* 11.8*  HCT 36.1* 33.7* 32.0* 34.2* 35.8*  MCV 99.7 99.1 100.9* 99.7 98.6  PLT 179 170 169 179 [id number]  Basic Metabolic Panel: Recent Labs  Lab 07/17/22 0407 07/18/22 2100 07/19/22 0256 07/20/22 0715 07/22/22 0754  NA 136 133* 136 134* 138  K 4.3 4.6  4.2 4.5 4.6  CL 106 105 109 106 107  CO2 23 21* 22 22 23  $ GLUCOSE 290* 474* 147* 291* 202*  BUN 43* 30* 28* 31* 33*  CREATININE 1.50* 1.41* 1.32* 1.34* 1.24  CALCIUM 8.9 8.6* 8.5* 8.7* 9.1   Liver Function Tests: No results for input(s): "AST", "ALT", "ALKPHOS", "BILITOT", "PROT", "ALBUMIN" in the last 168 hours. No results for input(s): "LIPASE", "AMYLASE" in the last 168 hours. No results for input(s): "AMMONIA" in the last 168 hours. Cardiac Enzymes: No results for input(s): "CKTOTAL", "CKMB", "CKMBINDEX", "TROPONINI" in the last 168 hours. BNP (last 3 results) No results for input(s): "BNP" in the last 8760 hours. CBG: Recent Labs  Lab 07/22/22 0731 07/22/22 1126 07/22/22 1633 07/22/22 2119 07/23/22 0752  GLUCAP 188* 319* 90 153* 155*    Time spent: 35 minutes  Signed:  Val Riles  [facility]  07/23/2022 10:33 AM

## 2022-07-22 NOTE — Progress Notes (Signed)
[facility] Progress Note  Patient: Mitchell Price    [id number]  DOA: 06/17/2022     Date of Service: the patient was seen and examined on 07/22/2022  Chief Complaint  Patient presents with   Fall   Brief hospital course: Mitchell Price is a 77 y.o. male with a PMH significant for type  2 diabetes mellitus, essential hypertension, history of DVT on Eliquis, history of PUD, diverticulosis, type diabetes mellitus and dyslipidemia.   They presented from ED to the ED on 06/17/2022 with intractable diarrhea x 1 days. He had been boarding in the ED awaiting long-term placement when he developed intractable diarrhea with incontinence. The dehydration that resulted contributed to an AKI.    In the ED, it was found that they had BP of 133/84 with temperature of 98, heart rate of 83 and respiratory rate of 20 with pulse oximetry of 96% on room air.  Significant findings included:  BUN of 41 and creatinine 1.52 compared to 22/1.11 on 1/10 and blood glucose of 237.  CBC showed hemoglobin 12.3 and hematocrit 37.8 with WBC of 5.5 and platelets 249.  Previous H&H were close. EKG On 1/10 showed normal sinus rhythm with a rate of 81 with left axis deviation and prolonged QT interval with QTc of 480 MS. Imaging: Most recent two-view chest x-ray on 1/10 showed linear scarring or atelectasis at the right lung base.  Abdominal pelvic CT scan this morning revealed bilateral hyperdense renal lesions that may be hemorrhagic/proteinaceous cysts with no acute abnormality in the abdomen or pelvis.  It showed cholelithiasis and body wall edema.  There was a recommendation for further follow-up with nonemergent MRI of the abdomen with and without contrast to rule out malignancy.   They were initially treated with 4 mg of IV Zofran and 1 L bolus of IV lactated Ringer and stool pathogen panel was ordered. Patient was not agreeable to sample collection.    Patient was admitted to medicine service for further  workup and management of diarrhea as outlined in detail below.   Assessment and Plan: Principal Problem:   AKI (acute kidney injury) ([place]) Active Problems:   Memory changes   Intractable diarrhea   Uncontrolled type 2 diabetes mellitus with hyperglycemia, with long-term current use of insulin (HCC)   Dyslipidemia   History of DVT (deep vein thrombosis)   Essential hypertension   Diabetic neuropathy (HCC)   # AKI, prerenal due to dehydration secondary to diarrhea Cr 1.52--1.3--1.14--1.35  gradually improving 2/4 decreased IVF NS 50 ml/hr, discontinued IV fluid on 2/6 RN was advised to encourage for oral fluid intake  2/6 urinary retention, Foley catheter was inserted, 1200 ml urine was collected.  Keep Foley for 1 week and then DC for voiding trial on 2/14 started Flomax. - repeat BMP am 2/9 Cr 1.5 slightly elevated, started NS 75 mill per hour for overnight hydration 2/14 Cr 1.24 stable  # Intractable diarrhea- Resolved  His abdominal exam is benign and abdominal CT scan showed no acute abdominal or pelvic abnormalities. Stool sample was not sent for GIP, diarrhea resolved. continue to monitor hydration status. S/p Florastor for 5 days  # Dementia- patient is oriented to self only and poorly redirected. His son stated that he is at his baseline. Will need placement - TOC engaged for placement - 1:1 sitter - delirium precautions - seroquel  - PRN haldol, ativan   # Bed bugs- found after admission. Room/fabrics cleaned/replaced   # Uncontrolled type 2 diabetes  mellitus with hyperglycemia, with long-term current use of insulin (HCC) On 2/2 patient had an episode of hypoglycemia, discontinued Semglee and discontinued home oral antidiabetic medications. Resolved S/p hypoglycemia protocol Continue NovoLog sliding scale, continue diabetic diet. 2/13 Increased  Semglee 28 units nightly, and NovoLog 7 units 3 times daily diabetic coordinator is following Monitor [facility]SB[facility].    Dyslipidemia - continue statin therapy.   Essential hypertension-  -  hold off lisinopril given his acute kidney injury. 2/2 started Lopressor with holding parameters. Use hydralazine po or IV as per BP parameters  History of DVT (deep vein thrombosis) - continue Eliquis    Diabetic neuropathy - continue Neurontin.  Insomnia, started melatonin 5 mg p.o. nightly and trazodone 25 mg p.o. nightly  Nicotine dependence, patient smokes cigarettes daily.  Nicotine patch was ordered but patient is refusing.  Smoking cessation counseling done.  Body mass index is 21.96 kg/m.  Interventions:   On admission patient had severe dehydration and low appetite, developed AKI due to significant diarrhea.   On 2/1 patient continued to have diarrhea, poor appetite and patient required IV fluid.   On 2/2 diarrhea resolved, AKI resolved, appetite is improving.  Decreased IV fluid NS 50 mL/h for gentle hydration, patient has significant dementia and may not drink enough water to keep himself hydrated. 2/5 d/c'd IVF   Diet: Carb modified diet DVT Prophylaxis: Therapeutic Anticoagulation with Eliquis    Advance goals of care discussion: Full code  Family Communication: family was not present at bedside, at the time of interview.  The pt provided permission to discuss medical plan with the family. Opportunity was given to ask question and all questions were answered satisfactorily.  Patient has significant dementia AAO x 1  Disposition:  Pt is from Home, admitted with worsening of dementia, awaiting in the ED for placement, developed diarrhea, dehydration and AKI so admitted as an inpatient for IV hydration.  AKI resolved, appetite improved, diarrhea resolved, had urinary retention, Foley catheter was inserted on 2/6.  Patient is clinically stable, medically optimized to discharge, awaiting placement. Discharge to LTC placement, when bed available.  Discharge most likely tomorrow a.m.  Subjective:  No significant events overnight, patient was sitting on the recliner without any acute distress, denied everything is all right, no new complaints.  Physical Exam: General: NAD, sitting comfortably in the recliner.  Appear in no distress, affect appropriate Eyes: sleepy ENT: Oral Mucosa Clear, moist  Neck: no JVD,  Cardiovascular: S1 and S2 Present, no Murmur,  Respiratory: good respiratory effort, Bilateral Air entry equal and Decreased, no Crackles, no wheezes Abdomen: Bowel Sound present, Soft and no tenderness,  Skin: no rashes Extremities: no Pedal edema, no calf tenderness Neurologic: without any new focal findings, AAO x 1 Gait not checked due to patient safety concerns  Vitals:   07/21/22 0757 07/21/22 1943 07/22/22 0439 07/22/22 0849  BP: 116/81 115/64 116/69 115/68  Pulse: 82 79 77 84  Resp: 18 20 20 18  $ Temp: 99.1 F (37.3 C) 98.3 F (36.8 C) 98.5 F (36.9 C) 98.7 F (37.1 C)  TempSrc:  Oral Oral   SpO2: 98% 100% 95% 99%  Weight:      Height:        Intake/Output Summary (Last 24 hours) at 07/22/2022 1154 Last data filed at 07/22/2022 0509 Gross per 24 hour  Intake 358 ml  Output 2200 ml  Net -1842 ml   Filed Weights   06/17/22 1549 07/08/22 1841  Weight: 86 kg 84 kg  Data Reviewed: I have personally reviewed and interpreted daily labs, tele strips, imagings as discussed above. I reviewed all nursing notes, pharmacy notes, vitals, pertinent old records I have discussed plan of care as described above with RN and patient/family.  CBC: Recent Labs  Lab 07/17/22 0407 07/18/22 2100 07/19/22 0256 07/20/22 0715 07/22/22 0754  WBC 5.0 5.0 5.7 5.1 4.4  HGB 11.9* 11.3* 10.6* 11.3* 11.8*  HCT 36.1* 33.7* 32.0* 34.2* 35.8*  MCV 99.7 99.1 100.9* 99.7 98.6  PLT 179 170 169 179 [id number]   Basic Metabolic Panel: Recent Labs  Lab 07/17/22 0407 07/18/22 2100 07/19/22 0256 07/20/22 0715 07/22/22 0754  NA 136 133* 136 134* 138  K 4.3 4.6 4.2 4.5 4.6  CL 106  105 109 106 107  CO2 23 21* 22 22 23  $ GLUCOSE 290* 474* 147* 291* 202*  BUN 43* 30* 28* 31* 33*  CREATININE 1.50* 1.41* 1.32* 1.34* 1.24  CALCIUM 8.9 8.6* 8.5* 8.7* 9.1    Studies: No results found.  Scheduled Meds:  apixaban  5 mg Oral BID   atorvastatin  40 mg Oral Daily   Chlorhexidine Gluconate Cloth  6 each Topical Daily   feeding supplement (GLUCERNA SHAKE)  237 mL Oral TID BM   ferrous sulfate  325 mg Oral Q breakfast   gabapentin  300 mg Oral Daily   insulin aspart  0-15 Units Subcutaneous TID WC   insulin aspart  0-5 Units Subcutaneous QHS   insulin aspart  7 Units Subcutaneous TID WC   insulin glargine-yfgn  28 Units Subcutaneous QHS   melatonin  5 mg Oral QHS   metoprolol tartrate  25 mg Oral BID   nicotine  21 mg Transdermal Daily   Followed by   Derrill Memo ON 07/30/2022] nicotine  14 mg Transdermal Daily   Followed by   Derrill Memo ON 08/06/2022] nicotine  7 mg Transdermal Daily   QUEtiapine  25 mg Oral BID   tamsulosin  0.4 mg Oral QPC supper   traZODone  25 mg Oral QHS   Continuous Infusions:    PRN Meds: acetaminophen **OR** acetaminophen, hydrALAZINE, hydrALAZINE, loperamide, LORazepam **OR** LORazepam, ondansetron **OR** ondansetron (ZOFRAN) IV, witch hazel-glycerin  Time spent: 25 minutes  Author: Val Riles. MD [facility] 07/22/2022 11:54 AM  To reach On-call, see care teams to locate the attending and reach out to them via [email or web address]. If 7PM-7AM, please contact night-coverage If you still have difficulty reaching the attending provider, please page the [facility] (Director on Call) for [facility] on amion for assistance.

## 2022-07-22 NOTE — Progress Notes (Signed)
Foley discontinue this AM. 638 ml on bladder scan. Patient disoriented, agressive refusing foley cath, unable to void. MD notified. See new orders.

## 2022-07-22 NOTE — TOC Progression Note (Signed)
Transition of Care [facility]) - Progression Note    Patient Details  Name: Mitchell Price MRN: [id number] Date of Birth: Dec 30, 1945  [facility]) CM/SW Contact  Beverly Sessions, RN Phone Number: 07/22/2022, 1:19 PM  Clinical Narrative:     Phoebe Perch faxed to [place] Per MD medications have been sent to [place] notified    Expected Discharge Plan: Assisted Living Barriers to Discharge: ED Unsafe disposition  Expected Discharge Plan and Services   Discharge Planning Services: CM Consult   Living arrangements for the past 2 months: Single Family Home                 DME Arranged: N/A DME Agency: NA                   Social Determinants of Health (SDOH) Interventions SDOH Screenings   Tobacco Use: High Risk (07/08/2022)    Readmission Risk Interventions     No data to display

## 2022-07-23 ENCOUNTER — Encounter: Payer: Self-pay | Admitting: Emergency Medicine

## 2022-07-23 ENCOUNTER — Inpatient Hospital Stay
Admission: EM | Admit: 2022-07-23 | Discharge: 2022-07-30 | DRG: 726 | Disposition: A | Discharge status: Home / Self Care | Payer: Medicare HMO | Attending: Internal Medicine | Admitting: Internal Medicine

## 2022-07-23 ENCOUNTER — Other Ambulatory Visit: Payer: Self-pay

## 2022-07-23 DIAGNOSIS — Z8711 Personal history of peptic ulcer disease: Secondary | ICD-10-CM

## 2022-07-23 DIAGNOSIS — I10 Essential (primary) hypertension: Secondary | ICD-10-CM | POA: Diagnosis present

## 2022-07-23 DIAGNOSIS — E785 Hyperlipidemia, unspecified: Secondary | ICD-10-CM | POA: Diagnosis present

## 2022-07-23 DIAGNOSIS — N1831 Chronic kidney disease, stage 3a: Secondary | ICD-10-CM | POA: Diagnosis present

## 2022-07-23 DIAGNOSIS — E1140 Type 2 diabetes mellitus with diabetic neuropathy, unspecified: Secondary | ICD-10-CM | POA: Diagnosis present

## 2022-07-23 DIAGNOSIS — Z751 Person awaiting admission to adequate facility elsewhere: Principal | ICD-10-CM

## 2022-07-23 DIAGNOSIS — R338 Other retention of urine: Secondary | ICD-10-CM | POA: Diagnosis present

## 2022-07-23 DIAGNOSIS — I129 Hypertensive chronic kidney disease with stage 1 through stage 4 chronic kidney disease, or unspecified chronic kidney disease: Secondary | ICD-10-CM | POA: Diagnosis present

## 2022-07-23 DIAGNOSIS — R339 Retention of urine, unspecified: Secondary | ICD-10-CM | POA: Diagnosis not present

## 2022-07-23 DIAGNOSIS — N401 Enlarged prostate with lower urinary tract symptoms: Secondary | ICD-10-CM | POA: Diagnosis not present

## 2022-07-23 DIAGNOSIS — Z7189 Other specified counseling: Secondary | ICD-10-CM

## 2022-07-23 DIAGNOSIS — Z833 Family history of diabetes mellitus: Secondary | ICD-10-CM

## 2022-07-23 DIAGNOSIS — F0390 Unspecified dementia without behavioral disturbance: Secondary | ICD-10-CM | POA: Diagnosis present

## 2022-07-23 DIAGNOSIS — Z7984 Long term (current) use of oral hypoglycemic drugs: Secondary | ICD-10-CM

## 2022-07-23 DIAGNOSIS — N179 Acute kidney failure, unspecified: Secondary | ICD-10-CM | POA: Diagnosis not present

## 2022-07-23 DIAGNOSIS — E1122 Type 2 diabetes mellitus with diabetic chronic kidney disease: Secondary | ICD-10-CM | POA: Diagnosis present

## 2022-07-23 DIAGNOSIS — F17200 Nicotine dependence, unspecified, uncomplicated: Secondary | ICD-10-CM | POA: Diagnosis present

## 2022-07-23 DIAGNOSIS — F03918 Unspecified dementia, unspecified severity, with other behavioral disturbance: Secondary | ICD-10-CM | POA: Diagnosis present

## 2022-07-23 DIAGNOSIS — Z7901 Long term (current) use of anticoagulants: Secondary | ICD-10-CM

## 2022-07-23 DIAGNOSIS — E1165 Type 2 diabetes mellitus with hyperglycemia: Secondary | ICD-10-CM | POA: Diagnosis present

## 2022-07-23 DIAGNOSIS — Z86718 Personal history of other venous thrombosis and embolism: Secondary | ICD-10-CM

## 2022-07-23 DIAGNOSIS — Z794 Long term (current) use of insulin: Secondary | ICD-10-CM

## 2022-07-23 DIAGNOSIS — Z79899 Other long term (current) drug therapy: Secondary | ICD-10-CM

## 2022-07-23 LAB — GLUCOSE, CAPILLARY
Glucose-Capillary: 155 mg/dL — ABNORMAL HIGH (ref 70–99)
Glucose-Capillary: 203 mg/dL — ABNORMAL HIGH (ref 70–99)
Glucose-Capillary: 463 mg/dL — ABNORMAL HIGH (ref 70–99)

## 2022-07-23 MED ORDER — GABAPENTIN 300 MG PO CAPS
300.0000 mg | ORAL_CAPSULE | Freq: Every day | ORAL | Status: DC
  Administered 2022-07-24 – 2022-07-30 (×7): 300 mg via ORAL
  Filled 2022-07-23 (×7): qty 1

## 2022-07-23 MED ORDER — INSULIN GLARGINE-YFGN 100 UNIT/ML ~~LOC~~ SOLN
10.0000 [IU] | Freq: Every day | SUBCUTANEOUS | Status: DC
  Administered 2022-07-23 – 2022-07-25 (×3): 10 [IU] via SUBCUTANEOUS
  Filled 2022-07-23 (×3): qty 0.1

## 2022-07-23 MED ORDER — QUETIAPINE FUMARATE 25 MG PO TABS
25.0000 mg | ORAL_TABLET | Freq: Two times a day (BID) | ORAL | Status: DC
  Administered 2022-07-23 – 2022-07-30 (×14): 25 mg via ORAL
  Filled 2022-07-23 (×14): qty 1

## 2022-07-23 MED ORDER — METOPROLOL TARTRATE 25 MG PO TABS
25.0000 mg | ORAL_TABLET | Freq: Two times a day (BID) | ORAL | Status: DC
  Administered 2022-07-23 – 2022-07-30 (×13): 25 mg via ORAL
  Filled 2022-07-23 (×14): qty 1

## 2022-07-23 MED ORDER — ATORVASTATIN CALCIUM 20 MG PO TABS
40.0000 mg | ORAL_TABLET | Freq: Every day | ORAL | Status: DC
  Administered 2022-07-23 – 2022-07-30 (×8): 40 mg via ORAL
  Filled 2022-07-23 (×8): qty 2

## 2022-07-23 MED ORDER — TRAZODONE HCL 50 MG PO TABS
25.0000 mg | ORAL_TABLET | Freq: Every day | ORAL | Status: DC
  Administered 2022-07-23 – 2022-07-29 (×7): 25 mg via ORAL
  Filled 2022-07-23 (×7): qty 1

## 2022-07-23 MED ORDER — ONDANSETRON HCL 4 MG/2ML IJ SOLN: 4.0000 mg | Freq: Four times a day (QID) | INTRAMUSCULAR | Status: DC | PRN

## 2022-07-23 MED ORDER — TAMSULOSIN HCL 0.4 MG PO CAPS
0.4000 mg | ORAL_CAPSULE | Freq: Every day | ORAL | Status: DC
  Administered 2022-07-23 – 2022-07-27 (×5): 0.4 mg via ORAL
  Filled 2022-07-23 (×5): qty 1

## 2022-07-23 MED ORDER — ACETAMINOPHEN 650 MG RE SUPP: 650.0000 mg | Freq: Four times a day (QID) | RECTAL | Status: DC | PRN

## 2022-07-23 MED ORDER — ONDANSETRON HCL 4 MG PO TABS: 4.0000 mg | ORAL_TABLET | Freq: Four times a day (QID) | ORAL | Status: DC | PRN

## 2022-07-23 MED ORDER — ACETAMINOPHEN 325 MG PO TABS
650.0000 mg | ORAL_TABLET | Freq: Four times a day (QID) | ORAL | Status: DC | PRN
  Administered 2022-07-24 – 2022-07-26 (×2): 650 mg via ORAL
  Filled 2022-07-23 (×2): qty 2

## 2022-07-23 MED ORDER — PIOGLITAZONE HCL 30 MG PO TABS
45.0000 mg | ORAL_TABLET | Freq: Every day | ORAL | Status: DC
  Administered 2022-07-24 – 2022-07-30 (×7): 45 mg via ORAL
  Filled 2022-07-23 (×7): qty 1

## 2022-07-23 MED ORDER — INSULIN ASPART 100 UNIT/ML IJ SOLN: 0.0000 [IU] | Freq: Three times a day (TID) | INTRAMUSCULAR | Status: DC

## 2022-07-23 MED ORDER — METFORMIN HCL 500 MG PO TABS
1000.0000 mg | ORAL_TABLET | Freq: Two times a day (BID) | ORAL | Status: DC
  Administered 2022-07-23 – 2022-07-30 (×13): 1000 mg via ORAL
  Filled 2022-07-23 (×13): qty 2

## 2022-07-23 MED ORDER — MELATONIN 5 MG PO TABS
5.0000 mg | ORAL_TABLET | Freq: Every day | ORAL | Status: DC
  Administered 2022-07-23 – 2022-07-29 (×7): 5 mg via ORAL
  Filled 2022-07-23 (×7): qty 1

## 2022-07-23 MED ORDER — POLYETHYLENE GLYCOL 3350 17 G PO PACK: 17.0000 g | PACK | Freq: Every day | ORAL | Status: DC | PRN

## 2022-07-23 MED ORDER — APIXABAN 5 MG PO TABS
5.0000 mg | ORAL_TABLET | Freq: Two times a day (BID) | ORAL | Status: DC
  Administered 2022-07-23 – 2022-07-30 (×14): 5 mg via ORAL
  Filled 2022-07-23 (×14): qty 1

## 2022-07-23 NOTE — Assessment & Plan Note (Addendum)
Eliquis 

## 2022-07-23 NOTE — TOC Transition Note (Signed)
Transition of Care [facility]) - CM/SW Discharge Note   Patient Details  Name: Mitchell Price MRN: [id number] Date of Birth: Jan 24, 1946  Transition of Care [facility]) CM/SW Contact:  Candie Chroman, LCSW Phone Number: 07/23/2022, 11:29 AM   Clinical Narrative:  Patient has orders to discharge to [place] today. RN will call report to [phone]. Taija from the facility will pick him up in 1 hour-1 1/2 hours and will call the unit when she is close. No further concerns. CSW signing off.   Final next level of care: Other (comment) ([place]) Barriers to Discharge: ED Unsafe disposition   Patient Goals and CMS Choice CMS Medicare.gov Compare Post Acute Care list provided to:: Patient Represenative (must comment) Choice offered to / list presented to : Adult Children  Discharge Placement                  Patient to be transferred to facility by: [facility] Staff Name of family member notified: [name] Patient and family notified of of transfer: 07/23/22  Discharge Plan and Services Additional resources added to the After Visit Summary for     Discharge Planning Services: CM Consult            DME Arranged: N/A DME Agency: NA                  Social Determinants of Health ([place]) Interventions SDOH Screenings   Tobacco Use: High Risk (07/08/2022)     Readmission Risk Interventions     No data to display

## 2022-07-23 NOTE — Assessment & Plan Note (Deleted)
When discussed code status with Mitchell Price, he states that he would not want to be revived in the setting of cardiac or pulmonary arrest.  He states that he hates suffering and would not want to suffer bedbound as he expects would occur after receiving resuscitation.  When I explained that without resuscitation, he would pass away without intervention, he stated "as I should naturally".  When I discussed this with patient's son, who is his healthcare power of attorney given patient's history of dementia, he states that he wants full scope of care.  When I informed him of what his father said, his son states that he would like patient to remain full scope of care.   - Continue goals of care discussions with family and patient

## 2022-07-23 NOTE — Progress Notes (Signed)
Report given to Tai with [facility], all questions answered.

## 2022-07-23 NOTE — Assessment & Plan Note (Addendum)
Patient on Semglee insulin 15 units at night sliding Scale plus Amaryl metformin and Actos.  Current Hemoglobin A1c elevated at 9.  Hemoglobin A1c elevated at 14 back in November.

## 2022-07-23 NOTE — ED Provider Notes (Signed)
   [facility] Provider Note    Event Date/Time   First MD Initiated Contact with Patient 07/23/22 1528     (approximate)   History   Placement   HPI  Mitchell Price is a 77 y.o. male  who presents to the emergency department today because the nursing facility that he had been arranged to go to cannot take patient's with foley catheters. Was discharged from the hospital today.    Physical Exam   Triage Vital Signs: ED Triage Vitals [07/23/22 1359]  Enc Vitals Group     BP 123/79     Pulse Rate 91     Resp 18     Temp 98.5 F (36.9 C)     Temp Source Oral     SpO2 94 %     Weight      Height      Head Circumference      Peak Flow      Pain Score 0     Pain Loc      Pain Edu?      Excl. in [place]?     Most recent vital signs: Vitals:   07/23/22 1359  BP: 123/79  Pulse: 91  Resp: 18  Temp: 98.5 F (36.9 C)  SpO2: 94%   General: Awake, alert, not completely oriented. CV:  Good peripheral perfusion. Regular rate and rhythm. Resp:  Normal effort.  Abd:  No distention.     ED Results / Procedures / Treatments   Labs (all labs ordered are listed, but only abnormal results are displayed) Labs Reviewed - No data to display   EKG  None  RADIOLOGY None   PROCEDURES:  Critical Care performed: No  Procedures   MEDICATIONS ORDERED IN ED: Medications - No data to display   IMPRESSION / MDM / [place] / ED COURSE  I reviewed the triage vital signs and the nursing notes.                              Differential diagnosis includes, but is not limited to, placement issue.  Patient presented to the emergency department today because the nursing facility had been arranged to go to could not accept him as a patient secondary to Foley catheter. Discussed with Dr. Charleen Kirks with the hospitalist service who will plan on admission.   FINAL CLINICAL IMPRESSION(S) / ED DIAGNOSES   Final diagnoses:  Encounter for  person awaiting admission to adequate facility elsewhere     Note:  This document was prepared using Dragon voice recognition software and may include unintentional dictation errors.    Nance Pear, MD 07/23/22 [phone]

## 2022-07-23 NOTE — Plan of Care (Signed)
  Problem: Education: Goal: Individualized Educational Video(s) Outcome: Progressing   Problem: Coping: Goal: Ability to adjust to condition or change in health will improve Outcome: Progressing

## 2022-07-23 NOTE — Assessment & Plan Note (Addendum)
-  Continue Lipitor °

## 2022-07-23 NOTE — Assessment & Plan Note (Deleted)
BMP this morning demonstrates improving AKI.  No further intervention at this time with the exception of continued Foley catheter.  -Monitor BMP intermittently while admitted

## 2022-07-23 NOTE — ED Triage Notes (Signed)
Patient to ED via POV for placement. Patient's son states they were discharged to rehab this AM from the 2nd floor and when patient got to facility they stated they could not take him due to foley catheter. Patient has no complaints at this time.
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
## 2022-07-23 NOTE — TOC Initial Note (Signed)
Transition of Care [facility]) - Initial/Assessment Note    Patient Details  Name: Mitchell Price MRN: [id number] Date of Birth: 05/05/1946  [facility] CM/SW Contact:    Shelbie Hutching, RN Phone Number: 07/23/2022, 4:05 PM  Clinical Narrative:                 Patient was discharged earlier today to [facility] but the [facility] did not know the patient had a foley catheter and they are unable to provide care at that level.   Patient back in the ED for placement.  If the foley catheter can be removed then the [facility] maybe able to reconsider.   Reached out to Mel[facility]uitter to see if he has any male beds available.  He will review referral, FL2.  SECURE emailed to [email or web address].    Expected Discharge Plan: Assisted Living Barriers to Discharge: ED Unsafe disposition   Patient Goals and CMS Choice Patient states their goals for this hospitalization and ongoing recovery are:: Needs ALF placment CMS Medicare.gov Compare Post Acute Care list provided to:: Patient Represenative (must comment) Choice offered to / list presented to : [facility] POA / Guardian      Expected Discharge Plan and Services   Discharge Planning Services: CM Consult                     DME Arranged: N/A DME Agency: NA       HH Arranged: RN          Prior Living Arrangements/Services     Patient language and need for interpreter reviewed:: Yes        Need for Family Participation in Patient Care: Yes (Comment) Care giver support system in place?: Yes (comment)   Criminal Activity/Legal Involvement Pertinent to Current Situation/Hospitalization: No - Comment as needed  Activities of Daily Living      Permission Sought/Granted      Share Information with NAME: [name]  Permission granted to share info w AGENCY: [facility]/ [facility]F  Permission granted to share info w Relationship: son  Permission granted to share info w Contact Information: [phone]  Emotional Assessment Appearance::  Appears stated age Attitude/Demeanor/Rapport: Engaged Affect (typically observed): Accepting Orientation: : Oriented to Self, Oriented to Place Alcohol / Substance Use: Not Applicable Psych Involvement: No (comment)  Admission diagnosis:  placement Patient Active Problem List   Diagnosis Date Noted   Altered mental status 07/09/2022   Fall 07/09/2022   Hyperglycemia 07/09/2022   Diarrhea of infectious origin 07/09/2022   Intractable diarrhea 07/08/2022   Uncontrolled type 2 diabetes mellitus with hyperglycemia, with long-term current use of insulin ([place]) 07/08/2022   Diabetic neuropathy ([place]) 07/08/2022   Dyslipidemia 07/08/2022   History of DVT (deep vein thrombosis) 07/08/2022   Essential hypertension 07/08/2022   Memory changes 06/19/2022   CKD (chronic kidney disease) stage 3, GFR 30-59 ml/min ([place]) 09/12/2019   Melena 09/12/2019   Acute blood loss anemia 09/12/2019   History of hemorrhoids 09/12/2019   Type 2 diabetes mellitus with stage 3 chronic kidney disease ([place]) 09/12/2019   AKI (acute kidney injury) ([place]) 09/12/2019   PUD (peptic ulcer disease) 09/12/2019   HTN (hypertension) 09/12/2019   Upper GI bleed 09/12/2019   Hyperglycemia due to type 2 diabetes mellitus ([place]) 09/12/2019   BPH (benign prostatic hyperplasia) 01/17/2019   HLD (hyperlipidemia) 01/17/2019   Multiple thyroid nodules 07/18/2017   Grade III hemorrhoids    Acute GI bleeding 07/13/2017   GIB (gastrointestinal  bleeding) 07/12/2017   Acidosis, hyperchloremic 12/13/2015   Erectile dysfunction due to arterial insufficiency 04/13/2014   Diverticulosis 08/25/2013   Internal hemorrhoid, bleeding 08/25/2013   Diabetes type 2, uncontrolled 08/22/2013   Renal mass 08/21/2013   Lens replaced by other means 01/26/2013   Status post cataract extraction 01/26/2013   Anemia 04/01/2012   PCP:  Doretha Imus, PA-C Pharmacy:   [facility], [place] [address] [place]  [address] Phone: [phone] Fax: [place], [place] - [address] [place] [address] Phone: [phone] Fax: [phone]     Social Determinants of Health (SDOH) Social History: SDOH Screenings   Tobacco Use: High Risk (07/23/2022)   SDOH Interventions:     Readmission Risk Interventions     No data to display

## 2022-07-23 NOTE — ED Notes (Signed)
Pt to be transported to room via wheelchair. Stable at time of departure.

## 2022-07-23 NOTE — ED Notes (Signed)
Hospitalist at bedside to see pt prior to transport to room.

## 2022-07-23 NOTE — Assessment & Plan Note (Addendum)
- 

## 2022-07-23 NOTE — Assessment & Plan Note (Addendum)
Patient has a history of recent urinary retention with a failed voiding challenge on 07/22/2022 with inability to void and bladder scan demonstrating over 600 cc.  Due to Foley, patient was unable to go to previously arranged group home.  Added Proscar.  Continue Flomax.  Will do another voiding trial on 2/21 or 2/22.

## 2022-07-23 NOTE — Assessment & Plan Note (Addendum)
Continue metoprolol. 

## 2022-07-23 NOTE — NC FL2 (Signed)
[place] LEVEL OF CARE FORM     IDENTIFICATION  Patient Name: Mitchell Price Birthdate: 03-25-1946 Sex: male Admission Date (Current Location): 07/23/2022  [facility] [place] Number:  Engineering geologist and Address:  [facility], [address], [place], [place] [address]      Provider Number: [id number]  Attending Physician Name and Address:  Nance Pear, MD  Relative Name and Phone Number:  Richard [phone]    Current Level of Care: Hospital Recommended Level of Care: [place], [place] Prior Approval Number:    Date Approved/Denied:   PASRR Number:    Discharge Plan: Other (Comment) ([facility]/ ALF)    Current Diagnoses: Patient Active Problem List   Diagnosis Date Noted   Altered mental status 07/09/2022   Fall 07/09/2022   Hyperglycemia 07/09/2022   Diarrhea of infectious origin 07/09/2022   Intractable diarrhea 07/08/2022   Uncontrolled type 2 diabetes mellitus with hyperglycemia, with long-term current use of insulin ([place]) 07/08/2022   Diabetic neuropathy ([place]) 07/08/2022   Dyslipidemia 07/08/2022   History of DVT (deep vein thrombosis) 07/08/2022   Essential hypertension 07/08/2022   Memory changes 06/19/2022   CKD (chronic kidney disease) stage 3, GFR 30-59 ml/min ([place]) 09/12/2019   Melena 09/12/2019   Acute blood loss anemia 09/12/2019   History of hemorrhoids 09/12/2019   Type 2 diabetes mellitus with stage 3 chronic kidney disease ([place]) 09/12/2019   AKI (acute kidney injury) ([place]) 09/12/2019   PUD (peptic ulcer disease) 09/12/2019   HTN (hypertension) 09/12/2019   Upper GI bleed 09/12/2019   Hyperglycemia due to type 2 diabetes mellitus ([place]) 09/12/2019   BPH (benign prostatic hyperplasia) 01/17/2019   HLD (hyperlipidemia) 01/17/2019   Multiple thyroid nodules 07/18/2017   Grade III hemorrhoids    Acute GI bleeding 07/13/2017   GIB (gastrointestinal bleeding)  07/12/2017   Acidosis, hyperchloremic 12/13/2015   Erectile dysfunction due to arterial insufficiency 04/13/2014   Diverticulosis 08/25/2013   Internal hemorrhoid, bleeding 08/25/2013   Diabetes type 2, uncontrolled 08/22/2013   Renal mass 08/21/2013   Lens replaced by other means 01/26/2013   Status post cataract extraction 01/26/2013   Anemia 04/01/2012    Orientation RESPIRATION BLADDER Height & Weight     Self, Place  Normal Indwelling catheter Weight:   Height:     BEHAVIORAL SYMPTOMS/MOOD NEUROLOGICAL BOWEL NUTRITION STATUS      Continent Diet (Regular)  AMBULATORY STATUS COMMUNICATION OF NEEDS Skin   Supervision Verbally Normal                       Personal Care Assistance Level of Assistance  Bathing, Feeding, Dressing Bathing Assistance: Limited assistance Feeding assistance: Limited assistance Dressing Assistance: Independent     Functional Limitations Info  Sight, Hearing, Speech Sight Info: Adequate Hearing Info: Adequate Speech Info: Adequate    SPECIAL CARE FACTORS FREQUENCY                       Contractures Contractures Info: Not present    Additional Factors Info  Code Status, Allergies Code Status Info: Full Allergies Info: NKDA           Current Medications (07/23/2022):  This is the current hospital active medication list No current facility-administered medications for this encounter.   Current Outpatient Medications  Medication Sig Dispense Refill   acetaminophen (TYLENOL) 500 MG tablet Take 1 tablet (500 mg total) by mouth every 6 (  six) hours as needed for mild pain. 30 tablet 0   apixaban (ELIQUIS) 5 MG TABS tablet Take 1 tablet (5 mg total) by mouth 2 (two) times daily. TAKE 1 TABLET(5 MG) BY MOUTH TWICE DAILY 60 tablet 0   atorvastatin (LIPITOR) 40 MG tablet Take 1 tablet (40 mg total) by mouth daily. 30 tablet 0   docusate sodium (COLACE) 100 MG capsule Take 1 capsule (100 mg total) by mouth 2 (two) times daily as  needed for mild constipation. 30 capsule 0   empagliflozin (JARDIANCE) 25 MG TABS tablet Take 1 tablet (25 mg total) by mouth daily. 30 tablet 0   ferrous sulfate 325 (65 FE) MG EC tablet Take 1 tablet (325 mg total) by mouth daily with breakfast. 30 tablet 0   gabapentin (NEURONTIN) 300 MG capsule Take 1 capsule (300 mg total) by mouth daily. 30 capsule 0   glimepiride (AMARYL) 4 MG tablet Take 1 tablet (4 mg total) by mouth every morning. 30 tablet 0   melatonin 5 MG TABS Take 1 tablet (5 mg total) by mouth at bedtime. 30 tablet 0   metFORMIN (GLUCOPHAGE) 1000 MG tablet Take 1 tablet (1,000 mg total) by mouth 2 (two) times daily. 60 tablet 0   metoprolol tartrate (LOPRESSOR) 25 MG tablet Take 1 tablet (25 mg total) by mouth 2 (two) times daily. 60 tablet 0   pioglitazone (ACTOS) 45 MG tablet Take 1 tablet (45 mg total) by mouth daily. 30 tablet 0   QUEtiapine (SEROQUEL) 25 MG tablet Take 1 tablet (25 mg total) by mouth 2 (two) times daily. 60 tablet 0   tamsulosin (FLOMAX) 0.4 MG CAPS capsule Take 1 capsule (0.4 mg total) by mouth daily after supper. 30 capsule 0   traZODone (DESYREL) 50 MG tablet Take 0.5 tablets (25 mg total) by mouth at bedtime. 15 tablet 0     Discharge Medications: Please see discharge summary for a list of discharge medications.  Relevant Imaging Results:  Relevant Lab Results:   Additional Information Special assistance application has been completed by the son with [facility]  Shelbie Hutching, RN

## 2022-07-24 DIAGNOSIS — E1140 Type 2 diabetes mellitus with diabetic neuropathy, unspecified: Secondary | ICD-10-CM | POA: Diagnosis present

## 2022-07-24 DIAGNOSIS — F17200 Nicotine dependence, unspecified, uncomplicated: Secondary | ICD-10-CM | POA: Diagnosis present

## 2022-07-24 DIAGNOSIS — E785 Hyperlipidemia, unspecified: Secondary | ICD-10-CM | POA: Diagnosis present

## 2022-07-24 DIAGNOSIS — Z79899 Other long term (current) drug therapy: Secondary | ICD-10-CM | POA: Diagnosis not present

## 2022-07-24 DIAGNOSIS — Z7901 Long term (current) use of anticoagulants: Secondary | ICD-10-CM | POA: Diagnosis not present

## 2022-07-24 DIAGNOSIS — E1165 Type 2 diabetes mellitus with hyperglycemia: Secondary | ICD-10-CM | POA: Diagnosis present

## 2022-07-24 DIAGNOSIS — R338 Other retention of urine: Secondary | ICD-10-CM | POA: Diagnosis present

## 2022-07-24 DIAGNOSIS — Z794 Long term (current) use of insulin: Secondary | ICD-10-CM | POA: Diagnosis not present

## 2022-07-24 DIAGNOSIS — I129 Hypertensive chronic kidney disease with stage 1 through stage 4 chronic kidney disease, or unspecified chronic kidney disease: Secondary | ICD-10-CM | POA: Diagnosis present

## 2022-07-24 DIAGNOSIS — Z86718 Personal history of other venous thrombosis and embolism: Secondary | ICD-10-CM | POA: Diagnosis not present

## 2022-07-24 DIAGNOSIS — R339 Retention of urine, unspecified: Secondary | ICD-10-CM | POA: Diagnosis present

## 2022-07-24 DIAGNOSIS — F03918 Unspecified dementia, unspecified severity, with other behavioral disturbance: Secondary | ICD-10-CM | POA: Diagnosis present

## 2022-07-24 DIAGNOSIS — N401 Enlarged prostate with lower urinary tract symptoms: Secondary | ICD-10-CM | POA: Diagnosis present

## 2022-07-24 DIAGNOSIS — Z8711 Personal history of peptic ulcer disease: Secondary | ICD-10-CM | POA: Diagnosis not present

## 2022-07-24 DIAGNOSIS — Z833 Family history of diabetes mellitus: Secondary | ICD-10-CM | POA: Diagnosis not present

## 2022-07-24 DIAGNOSIS — N1831 Chronic kidney disease, stage 3a: Secondary | ICD-10-CM | POA: Diagnosis present

## 2022-07-24 DIAGNOSIS — Z7984 Long term (current) use of oral hypoglycemic drugs: Secondary | ICD-10-CM | POA: Diagnosis not present

## 2022-07-24 DIAGNOSIS — E1122 Type 2 diabetes mellitus with diabetic chronic kidney disease: Secondary | ICD-10-CM | POA: Diagnosis present

## 2022-07-24 LAB — GLUCOSE, CAPILLARY
Glucose-Capillary: 156 mg/dL — ABNORMAL HIGH (ref 70–99)
Glucose-Capillary: 165 mg/dL — ABNORMAL HIGH (ref 70–99)
Glucose-Capillary: 235 mg/dL — ABNORMAL HIGH (ref 70–99)
Glucose-Capillary: 256 mg/dL — ABNORMAL HIGH (ref 70–99)
Glucose-Capillary: 359 mg/dL — ABNORMAL HIGH (ref 70–99)
Glucose-Capillary: 425 mg/dL — ABNORMAL HIGH (ref 70–99)
Glucose-Capillary: 430 mg/dL — ABNORMAL HIGH (ref 70–99)
Glucose-Capillary: 66 mg/dL — ABNORMAL LOW (ref 70–99)
Glucose-Capillary: 82 mg/dL (ref 70–99)

## 2022-07-24 LAB — GLUCOSE, RANDOM: Glucose, Bld: 436 mg/dL — ABNORMAL HIGH (ref 70–99)

## 2022-07-24 MED ORDER — INSULIN ASPART 100 UNIT/ML IJ SOLN
15.0000 [IU] | Freq: Once | INTRAMUSCULAR | Status: AC
  Administered 2022-07-24: 15 [IU] via SUBCUTANEOUS
  Filled 2022-07-24: qty 1

## 2022-07-24 MED ORDER — CHLORHEXIDINE GLUCONATE CLOTH 2 % EX PADS
6.0000 | MEDICATED_PAD | Freq: Every day | CUTANEOUS | Status: DC
  Administered 2022-07-24 – 2022-07-29 (×6): 6 via TOPICAL

## 2022-07-24 MED ORDER — GLIMEPIRIDE 4 MG PO TABS
4.0000 mg | ORAL_TABLET | ORAL | Status: DC
  Administered 2022-07-25 – 2022-07-30 (×6): 4 mg via ORAL
  Filled 2022-07-24 (×6): qty 1

## 2022-07-24 MED ORDER — INSULIN ASPART 100 UNIT/ML IJ SOLN
0.0000 [IU] | Freq: Three times a day (TID) | INTRAMUSCULAR | Status: DC
  Administered 2022-07-24: 15 [IU] via SUBCUTANEOUS
  Administered 2022-07-24: 8 [IU] via SUBCUTANEOUS
  Administered 2022-07-24 – 2022-07-25 (×2): 3 [IU] via SUBCUTANEOUS
  Administered 2022-07-25: 5 [IU] via SUBCUTANEOUS
  Administered 2022-07-25: 8 [IU] via SUBCUTANEOUS
  Administered 2022-07-25: 3 [IU] via SUBCUTANEOUS
  Administered 2022-07-26 (×2): 5 [IU] via SUBCUTANEOUS
  Administered 2022-07-26: 3 [IU] via SUBCUTANEOUS
  Administered 2022-07-26: 8 [IU] via SUBCUTANEOUS
  Administered 2022-07-27: 3 [IU] via SUBCUTANEOUS
  Administered 2022-07-27 – 2022-07-28 (×3): 5 [IU] via SUBCUTANEOUS
  Administered 2022-07-28 (×2): 3 [IU] via SUBCUTANEOUS
  Administered 2022-07-28: 2 [IU] via SUBCUTANEOUS
  Administered 2022-07-29: 5 [IU] via SUBCUTANEOUS
  Administered 2022-07-29: 3 [IU] via SUBCUTANEOUS
  Administered 2022-07-29: 2 [IU] via SUBCUTANEOUS
  Administered 2022-07-30: 8 [IU] via SUBCUTANEOUS
  Filled 2022-07-24 (×22): qty 1

## 2022-07-24 NOTE — Progress Notes (Signed)
[place] at [place] NAME: Mitchell Price    MR#:  [id number]  DATE OF BIRTH:  27-Nov-1945  SUBJECTIVE:   Scene patient earlier. Feeling himself. Got upset wants to go home. Has dementia at baseline. No family at bedside   VITALS:  Blood pressure 112/66, pulse 81, temperature 98.2 F (36.8 C), resp. rate 16, weight 84 kg, SpO2 96 %.  PHYSICAL EXAMINATION:   GENERAL:  77 y.o.-year-old patient with no acute distress.  LUNGS: Normal breath sounds bilaterally, no wheezing CARDIOVASCULAR: S1, S2 normal. No murmur   ABDOMEN: Soft, nontender, nondistended. Bowel sounds present.  FOLEY+ EXTREMITIES: No  edema b/l.    NEUROLOGIC: nonfocal  patient is alert and awake, dementia at baseline SKIN: No obvious rash, lesion, or ulcer.   LABORATORY PANEL:  CBC Recent Labs  Lab 07/22/22 0754  WBC 4.4  HGB 11.8*  HCT 35.8*  PLT 197    Chemistries  Recent Labs  Lab 07/22/22 0754 07/24/22 0135  NA 138  --   K 4.6  --   CL 107  --   CO2 23  --   GLUCOSE 202* 436*  BUN 33*  --   CREATININE 1.24  --   CALCIUM 9.1  --    Assessment and Plan  Mitchell Price is a 77 y.o. male with medical history significant of type 2 diabetes, hypertension, DVT on Eliquis, PUD, diverticulosis, urinary retention, dementia, who presents to the ED due to placement difficulties.  Additional comorbidities managed during hospitalization includes dementia, uncontrolled type 2 diabetes, hypertension, dyslipidemia, diabetic neuropathy, insomnia. Hospital stay was complicated by urinary retention, s/p failed voiding challenge. Due to this, Foley was replaced on 07/22/2022 with plans for repeating voiding challenge in 1 to 2 weeks.   Urinary retention Patient has a history of recent urinary retention with a failed voiding challenge on 07/22/2022 with inability to void and bladder scan demonstrating over 600 cc.  Due to Foley, patient was unable to go to previously  arranged SNF.  - Continue Foley catheter - Trial voiding challenge in 1 to 2 weeks - TOC consulted-- [place] refused to take pt with foley    Uncontrolled type 2 diabetes mellitus with hyperglycemia, with long-term current use of insulin ([place]) On prior admission, patient was receiving NovoLog 70 units with each meal, NovoLog sliding scale and Semglee 28 units at bedtime.   - Continue home metformin , glimipride and pioglitazone - Hold home Jardiance in the setting of Foley catheter to reduce risk of CAUTI - SSI, moderate - Semglee 10 units prior to bedtime   Dyslipidemia - Continue home statin   History of DVT (deep vein thrombosis) - Continue home Eliquis   Essential hypertension - Continue home antihypertensives   Dementia ([place]) - Delirium precautions - Continue home Seroquel and trazodone   Procedures:none Family communication : Son Actuary Consults :none CODE STATUS: FULL DVT Prophylaxis :eliquis Level of care: Med-Surg Status is: Inpatient Remains inpatient appropriate because: TOC for d/c planning    TOTAL TIME TAKING CARE OF THIS PATIENT: 35 minutes.  >50% time spent on counselling and coordination of care  Note: This dictation was prepared with Dragon dictation along with smaller phrase technology. Any transcriptional errors that result from this process are unintentional.  Fritzi Mandes M.D    [facility]   CC: Primary care physician; Mitchell Imus, PA-C

## 2022-07-24 NOTE — Inpatient Diabetes Management (Signed)
Inpatient Diabetes Program Recommendations  AACE/ADA: New Consensus Statement on Inpatient Glycemic Control (2015)  Target Ranges:  Prepandial:   less than 140 mg/dL      Peak postprandial:   less than 180 mg/dL (1-2 hours)      Critically ill patients:  140 - 180 mg/dL   Lab Results  Component Value Date   GLUCAP 235 (H) 07/24/2022    Latest Reference Range & Units 07/23/22 11:31 07/23/22 22:22 07/24/22 00:03 07/24/22 01:00 07/24/22 06:37 07/24/22 07:50 07/24/22 08:58  Glucose-Capillary 70 - 99 mg/dL 203 (H) 463 (H) 430 (H) 425 (H) Novolog 15 units given @ 0335 am 82 66 (L) 235 (H)  (H): Data is abnormally high (L): Data is abnormally low  Home DM Meds: Metformin 1000 mg BID     Actos 30 mg daily     Jardiance 25 mg daily     Amaryl 4 mg daily       Current Orders: Semglee 50 units QHS                            Novolog Moderate Correction Scale/ SSI (0-15 units) TID AC + HS, Metformin 1 gm bid, Actos 45 mg qd                             Patient had hypoglycemia this am 66 after receiving Novolog 15 units  335 am. Please consider:   1. Decrease Semglee to 28 units QHS    2. Add Novolog Meal Coverage 5 units TID with meals and D/C Actos & Metformin  3. Decrease Novolog correction to 0-5 units hs  Noted patient discharged to [facility] 07/23/22 and returned to ED due to facility unable to care for patient with foley catheter.   Thank you, Nani Gasser. Braidyn Scorsone, RN, MSN, CDE  Diabetes Coordinator Inpatient Glycemic Control Team Team Pager [phone] (8am-5pm) 07/24/2022 9:53 AM

## 2022-07-24 NOTE — Plan of Care (Signed)

## 2022-07-24 NOTE — TOC Progression Note (Signed)
Transition of Care [facility]) - Progression Note    Patient Details  Name: Mitchell Price MRN: [id number] Date of Birth: 1945/09/08  Transition of Care [facility]) CM/SW [place], RN Phone Number: 07/24/2022, 12:24 PM  Clinical Narrative:    [facility] stated that they do not have the ability to manage a foley and can not take the patient with one   Expected Discharge Plan: Assisted Living Barriers to Discharge: ED Unsafe disposition  Expected Discharge Plan and Services   Discharge Planning Services: CM Consult                     DME Arranged: N/A DME Agency: NA       HH Arranged: RN           Social Determinants of Health (SDOH) Interventions SDOH Screenings   Food Insecurity: Unknown (07/23/2022)  Transportation Needs: Unknown (07/23/2022)  Utilities: Unknown (07/23/2022)  Tobacco Use: High Risk (07/23/2022)    Readmission Risk Interventions     No data to display

## 2022-07-25 LAB — GLUCOSE, CAPILLARY
Glucose-Capillary: 152 mg/dL — ABNORMAL HIGH (ref 70–99)
Glucose-Capillary: 245 mg/dL — ABNORMAL HIGH (ref 70–99)
Glucose-Capillary: 278 mg/dL — ABNORMAL HIGH (ref 70–99)
Glucose-Capillary: 164 mg/dL — ABNORMAL HIGH (ref 70–99)

## 2022-07-25 NOTE — Plan of Care (Signed)

## 2022-07-25 NOTE — Progress Notes (Signed)
[facility] [place] NAME: Mitchell Price    MR#:  [id number]  DATE OF BIRTH:  1945-06-20  SUBJECTIVE:   No new issues   VITALS:  Blood pressure 103/69, pulse 73, temperature 98 F (36.7 C), resp. rate 18, weight 84 kg, SpO2 99 %.  PHYSICAL EXAMINATION:   GENERAL:  77 y.o.-year-old patient with no acute distress.  LUNGS: Normal breath sounds bilaterally, no wheezing CARDIOVASCULAR: S1, S2 normal. No murmur   ABDOMEN: Soft, nontender, nondistended. Bowel sounds present.  FOLEY+ EXTREMITIES: No  edema b/l.    NEUROLOGIC: nonfocal  patient is alert and awake, dementia at baseline SKIN: No obvious rash, lesion, or ulcer.   LABORATORY PANEL:  CBC Recent Labs  Lab 07/22/22 0754  WBC 4.4  HGB 11.8*  HCT 35.8*  PLT 197     Chemistries  Recent Labs  Lab 07/22/22 0754 07/24/22 0135  NA 138  --   K 4.6  --   CL 107  --   CO2 23  --   GLUCOSE 202* 436*  BUN 33*  --   CREATININE 1.24  --   CALCIUM 9.1  --     Assessment and Plan  Mitchell Price is a 77 y.o. male with medical history significant of type 2 diabetes, hypertension, DVT on Eliquis, PUD, diverticulosis, urinary retention, dementia, who presents to the ED due to placement difficulties.  Additional comorbidities managed during hospitalization includes dementia, uncontrolled type 2 diabetes, hypertension, dyslipidemia, diabetic neuropathy, insomnia. Hospital stay was complicated by urinary retention, s/p failed voiding challenge. Due to this, Foley was replaced on 07/22/2022 with plans for repeating voiding challenge in 1 to 2 weeks.   Urinary retention Patient has a history of recent urinary retention with a failed voiding challenge on 07/22/2022 with inability to void and bladder scan demonstrating over 600 cc.  Due to Foley, patient was unable to go to previously arranged SNF.  - Continue Foley catheter - Trial voiding challenge in 1 to 2 weeks - TOC consulted--  [facility] refused to take pt with foley    Uncontrolled type 2 diabetes mellitus with hyperglycemia, with long-term current use of insulin ([place]) On prior admission, patient was receiving NovoLog 70 units with each meal, NovoLog sliding scale and Semglee 28 units at bedtime. - Continue home metformin , glimipride and pioglitazone - Hold home Jardiance in the setting of Foley catheter to reduce risk of CAUTI - SSI, moderate - Semglee 10 units prior to bedtime   Dyslipidemia - Continue home statin   History of DVT (deep vein thrombosis) - Continue home Eliquis   Essential hypertension - Continue home antihypertensives   Dementia ([place]) - Delirium precautions - Continue home Seroquel and trazodone   Procedures:none Family communication : Son Actuary Consults :none CODE STATUS: FULL DVT Prophylaxis :eliquis Level of care: Med-Surg Status is: Inpatient Remains inpatient appropriate because: TOC for d/c planning    TOTAL TIME TAKING CARE OF THIS PATIENT: 35 minutes.  >50% time spent on counselling and coordination of care  Note: This dictation was prepared with Dragon dictation along with smaller phrase technology. Any transcriptional errors that result from this process are unintentional.  Fritzi Mandes M.D    [facility]   CC: Primary care physician; Doretha Imus, PA-C

## 2022-07-26 LAB — GLUCOSE, CAPILLARY
Glucose-Capillary: 243 mg/dL — ABNORMAL HIGH (ref 70–99)
Glucose-Capillary: 242 mg/dL — ABNORMAL HIGH (ref 70–99)
Glucose-Capillary: 263 mg/dL — ABNORMAL HIGH (ref 70–99)
Glucose-Capillary: 200 mg/dL — ABNORMAL HIGH (ref 70–99)

## 2022-07-26 MED ORDER — INSULIN GLARGINE-YFGN 100 UNIT/ML ~~LOC~~ SOLN
15.0000 [IU] | Freq: Every day | SUBCUTANEOUS | Status: DC
  Administered 2022-07-26 – 2022-07-29 (×4): 15 [IU] via SUBCUTANEOUS
  Filled 2022-07-26 (×5): qty 0.15

## 2022-07-26 NOTE — Progress Notes (Signed)
Mitchell Price at [place] NAME: Mitchell Price    MR#:  [id number]  DATE OF BIRTH:  [birth year]  SUBJECTIVE:   No new issues OOB to chair. Low grade fever 100.1  VITALS:  Blood pressure 107/64, pulse 96, temperature (!) 100.5 F (38.1 C), resp. rate 19, weight 84 kg, SpO2 97 %.  PHYSICAL EXAMINATION:   GENERAL:  77 y.o.-year-old patient with no acute distress.  LUNGS: Normal breath sounds bilaterally, no wheezing CARDIOVASCULAR: S1, S2 normal. No murmur   ABDOMEN: Soft, nontender, nondistended. Bowel sounds present.  FOLEY+ EXTREMITIES: No  edema b/l.    NEUROLOGIC: nonfocal  patient is alert and awake, dementia at baseline SKIN: No obvious rash, lesion, or ulcer.   LABORATORY PANEL:  CBC Recent Labs  Lab 07/22/22 0754  WBC 4.4  HGB 11.8*  HCT 35.8*  PLT 197     Chemistries  Recent Labs  Lab 07/22/22 0754 07/24/22 0135  NA 138  --   K 4.6  --   CL 107  --   CO2 23  --   GLUCOSE 202* 436*  BUN 33*  --   CREATININE 1.24  --   CALCIUM 9.1  --     Assessment and Plan  Mitchell Price is a 77 y.o. male with medical history significant of type 2 diabetes, hypertension, DVT on Eliquis, PUD, diverticulosis, urinary retention, dementia, who presents to the ED due to placement difficulties.  Additional comorbidities managed during hospitalization includes dementia, uncontrolled type 2 diabetes, hypertension, dyslipidemia, diabetic neuropathy, insomnia. Hospital stay was complicated by urinary retention, s/p failed voiding challenge. Due to this, Foley was replaced on 07/22/2022 with plans for repeating voiding challenge in 1 to 2 weeks.   Urinary retention Patient has a history of recent urinary retention with a failed voiding challenge on 07/22/2022 with inability to void and bladder scan demonstrating over 600 cc.  Due to Foley, patient was unable to go to previously arranged SNF.  - Continue Foley catheter - Trial voiding  challenge in 1 to 2 weeks - TOC consulted-- [facility] refused to take pt with foley --consider reaching out to Urology on Monday to see if they can consider trial while inpt.    Uncontrolled type 2 diabetes mellitus with hyperglycemia, with long-term current use of insulin (HCC) On prior admission, patient was receiving NovoLog 70 units with each meal, NovoLog sliding scale and Semglee 28 units at bedtime. - Continue home metformin , glimipride and pioglitazone - Hold home Jardiance in the setting of Foley catheter to reduce risk of CAUTI - SSI, moderate - Semglee 15 units prior to bedtime--adjust according to sugars   Dyslipidemia - Continue home statin   History of DVT (deep vein thrombosis) - Continue home Eliquis   Essential hypertension - Continue home antihypertensives   Dementia ([place]) - Delirium precautions - Continue home Seroquel and trazodone   Procedures:none Family communication : Son Mitchell Price on 2/18 Consults :none CODE STATUS: FULL DVT Prophylaxis :eliquis Level of care: Med-Surg Status is: Inpatient Remains inpatient appropriate because: TOC for d/c planning    TOTAL TIME TAKING CARE OF THIS PATIENT: 35 minutes.  >50% time spent on counselling and coordination of care  Note: This dictation was prepared with Dragon dictation along with smaller phrase technology. Any transcriptional errors that result from this process are unintentional.  Fritzi Mandes M.D    [facility]   CC: Primary care physician; Doretha Imus, PA-C

## 2022-07-27 DIAGNOSIS — N1831 Chronic kidney disease, stage 3a: Secondary | ICD-10-CM

## 2022-07-27 DIAGNOSIS — I10 Essential (primary) hypertension: Secondary | ICD-10-CM

## 2022-07-27 DIAGNOSIS — Z86718 Personal history of other venous thrombosis and embolism: Secondary | ICD-10-CM

## 2022-07-27 DIAGNOSIS — F0390 Unspecified dementia without behavioral disturbance: Secondary | ICD-10-CM

## 2022-07-27 DIAGNOSIS — E1165 Type 2 diabetes mellitus with hyperglycemia: Secondary | ICD-10-CM

## 2022-07-27 DIAGNOSIS — Z794 Long term (current) use of insulin: Secondary | ICD-10-CM

## 2022-07-27 DIAGNOSIS — E785 Hyperlipidemia, unspecified: Secondary | ICD-10-CM

## 2022-07-27 LAB — BASIC METABOLIC PANEL
Anion gap: 7 (ref 5–15)
BUN: 48 mg/dL — ABNORMAL HIGH (ref 8–23)
CO2: 24 mmol/L (ref 22–32)
Calcium: 9.1 mg/dL (ref 8.9–10.3)
Chloride: 105 mmol/L (ref 98–111)
Creatinine, Ser: 1.45 mg/dL — ABNORMAL HIGH (ref 0.61–1.24)
GFR, Estimated: 49 mL/min — ABNORMAL LOW (ref >60)
Glucose, Bld: 115 mg/dL — ABNORMAL HIGH (ref 70–99)
Potassium: 4.6 mmol/L (ref 3.5–5.1)
Sodium: 136 mmol/L (ref 135–145)

## 2022-07-27 LAB — CBC
HCT: 36.1 % — ABNORMAL LOW (ref 39.0–52.0)
Hemoglobin: 11.7 g/dL — ABNORMAL LOW (ref 13.0–17.0)
MCH: 32.4 pg (ref 26.0–34.0)
MCHC: 32.4 g/dL (ref 30.0–36.0)
MCV: 100 fL (ref 80.0–100.0)
Platelets: 284 10*3/uL (ref 150–400)
RBC: 3.61 MIL/uL — ABNORMAL LOW (ref 4.22–5.81)
RDW: 12.4 % (ref 11.5–15.5)
WBC: 5.6 10*3/uL (ref 4.0–10.5)
nRBC: 0 % (ref 0.0–0.2)

## 2022-07-27 LAB — GLUCOSE, CAPILLARY
Glucose-Capillary: 101 mg/dL — ABNORMAL HIGH (ref 70–99)
Glucose-Capillary: 215 mg/dL — ABNORMAL HIGH (ref 70–99)
Glucose-Capillary: 212 mg/dL — ABNORMAL HIGH (ref 70–99)
Glucose-Capillary: 154 mg/dL — ABNORMAL HIGH (ref 70–99)

## 2022-07-27 MED ORDER — FINASTERIDE 5 MG PO TABS
5.0000 mg | ORAL_TABLET | Freq: Every day | ORAL | Status: DC
  Administered 2022-07-27 – 2022-07-30 (×4): 5 mg via ORAL
  Filled 2022-07-27 (×4): qty 1

## 2022-07-27 NOTE — Hospital Course (Signed)
77 y.o. male with medical history significant of type 2 diabetes, hypertension, DVT on Eliquis, PUD, diverticulosis, urinary retention, dementia, who presents to the ED due to placement difficulties.   Mr. Senn states he does not have any complaints at this time.  He denies any chest pain, palpitations, shortness of breath or abdominal pain.    Patient was recently hospitalized from 06/17/2022 - 07/23/2022, discharged this AM.  Per discharge summary, patient was admitted for AKI secondary to dehydration in the setting of diarrhea.  He was treated with 5 days of Florastor.  Additional comorbidities managed during hospitalization includes dementia, uncontrolled type 2 diabetes, hypertension, dyslipidemia, diabetic neuropathy, insomnia.  Hospital stay was complicated by urinary retention, s/p failed voiding challenge.  Due to this, Foley was replaced on 07/22/2022 with plans for repeating voiding challenge in 1 to 2 weeks.   ED Course:  On arrival to the ED, patient was normotensive at 123/79 with heart rate of 91.  He was saturating at 94% on room air.  He was afebrile at 98.5.  Due to unsafe disposition and lack of current placement options, [facility] contacted for admission.  2/21: Patient with disposition issues as prior group home is refusing to take him back with Foley catheter.  Failed voiding trial on 2/14, will try second time today.  Patient is able to void after removal of Foley catheter.  Hoping he can go back to his prior group home tomorrow.  2/22: Remained hemodynamically stable.  Voiding well with no significant postvoid volume. He is being discharged back to his group home with increased dose of Flomax and also added Proscar to help with BPH symptoms.  Patient need to follow-up with urology for further recommendations.  He will continue with rest of his home medications and need to have a close follow-up with PCP for further management.

## 2022-07-27 NOTE — Assessment & Plan Note (Signed)
Creatinine variable during last hospital course in this hospital course.  Current creatinine 1.45 with a GFR 49.

## 2022-07-27 NOTE — Care Management Important Message (Signed)
Important Message  Patient Details  Name: Mitchell Price MRN: [id number] Date of Birth: 04/25/1946   Medicare Important Message Given:  N/A - LOS <3 / Initial given by admissions     [name] 07/27/2022, 7:39 AM

## 2022-07-27 NOTE — Progress Notes (Signed)
Progress Note   Patient: Mitchell Price [id number] DOB: [birth year] DOA: 07/23/2022     3 DOS: the patient was seen and examined on 07/27/2022   Brief hospital course: 77 y.o. male with medical history significant of type 2 diabetes, hypertension, DVT on Eliquis, PUD, diverticulosis, urinary retention, dementia, who presents to the ED due to placement difficulties.   Mitchell Price states he does not have any complaints at this time.  He denies any chest pain, palpitations, shortness of breath or abdominal pain.    Patient was recently hospitalized from 06/17/2022 - 07/23/2022, discharged this AM.  Per discharge summary, patient was admitted for AKI secondary to dehydration in the setting of diarrhea.  He was treated with 5 days of Florastor.  Additional comorbidities managed during hospitalization includes dementia, uncontrolled type 2 diabetes, hypertension, dyslipidemia, diabetic neuropathy, insomnia.  Hospital stay was complicated by urinary retention, s/p failed voiding challenge.  Due to this, Foley was replaced on 07/22/2022 with plans for repeating voiding challenge in 1 to 2 weeks.   ED Course:  On arrival to the ED, patient was normotensive at 123/79 with heart rate of 91.  He was saturating at 94% on room air.  He was afebrile at 98.5.  Due to unsafe disposition and lack of current placement options, [facility] contacted for admission.  Assessment and Plan: * Urinary retention Patient has a history of recent urinary retention with a failed voiding challenge on 07/22/2022 with inability to void and bladder scan demonstrating over 600 cc.  Due to Foley, patient was unable to go to previously arranged group home.  Added Proscar.  Continue Flomax.  Will do another voiding trial on 2/21 or 2/22.  Stage 3a chronic kidney disease (CKD) (HCC) Creatinine variable during last hospital course in this hospital course.  Current creatinine 1.45 with a GFR 49.  Uncontrolled type 2 diabetes mellitus with  hyperglycemia, with long-term current use of insulin ([place]) Patient on Semglee insulin 15 units at night sliding Scale plus Amaryl metformin and Actos.  Check a hemoglobin A1c.  Hemoglobin A1c elevated at 14 back in November.  Dyslipidemia Continue Lipitor  History of DVT (deep vein thrombosis) Eliquis  Essential hypertension Continue metoprolol  Dementia without behavioral disturbance (HCC) Continue home Seroquel and trazodone        Subjective: Patient feeling okay.  Offers no complaints.  Had Foley replaced on 2/14 for failing voiding trial.  Patient has been on Flomax.  Will add Proscar.  Physical Exam: Vitals:   07/26/22 1144 07/26/22 1556 07/26/22 2208 07/27/22 0756  BP: 108/62 119/69 107/64 108/72  Pulse: 82 79 86 75  Resp:  18 18 17  $ Temp:  98.2 F (36.8 C) 98 F (36.7 C) 98.1 F (36.7 C)  TempSrc:   Oral   SpO2:  99% 100% 99%  Weight:       Physical Exam HENT:     Head: Normocephalic.     Mouth/Throat:     Pharynx: No oropharyngeal exudate.  Eyes:     General: Lids are normal.     Conjunctiva/sclera: Conjunctivae normal.  Cardiovascular:     Rate and Rhythm: Normal rate and regular rhythm.     Heart sounds: Normal heart sounds, S1 normal and S2 normal.  Pulmonary:     Breath sounds: No decreased breath sounds, wheezing, rhonchi or rales.  Abdominal:     Palpations: Abdomen is soft.     Tenderness: There is no abdominal tenderness.  Musculoskeletal:  Right lower leg: No swelling.     Left lower leg: No swelling.  Skin:    General: Skin is warm.     Findings: No rash.  Neurological:     Mental Status: He is alert.     Data Reviewed: Creatinine 1.45, hemoglobin 11.7  Family Communication: Updated son on the phone  Disposition: Status is: Inpatient Remains inpatient appropriate because: Unfortunately the place that they sent him to was unable to take him with a Foley catheter.  Will try to do another voiding trial on 2/21 or 2/22.  Planned  Discharge Destination: Group home    Time spent: 28 minutes  Author: Loletha Grayer, MD 07/27/2022 1:08 PM  For on call review [email or web address].

## 2022-07-28 LAB — HEMOGLOBIN A1C
Hgb A1c MFr Bld: 9 % — ABNORMAL HIGH (ref 4.8–5.6)
Mean Plasma Glucose: 211.6 mg/dL

## 2022-07-28 LAB — CREATININE, SERUM
Creatinine, Ser: 1.36 mg/dL — ABNORMAL HIGH (ref 0.61–1.24)
GFR, Estimated: 53 mL/min — ABNORMAL LOW (ref >60)

## 2022-07-28 LAB — GLUCOSE, CAPILLARY
Glucose-Capillary: 121 mg/dL — ABNORMAL HIGH (ref 70–99)
Glucose-Capillary: 225 mg/dL — ABNORMAL HIGH (ref 70–99)
Glucose-Capillary: 179 mg/dL — ABNORMAL HIGH (ref 70–99)
Glucose-Capillary: 189 mg/dL — ABNORMAL HIGH (ref 70–99)

## 2022-07-28 MED ORDER — TAMSULOSIN HCL 0.4 MG PO CAPS
0.8000 mg | ORAL_CAPSULE | Freq: Every day | ORAL | Status: DC
  Administered 2022-07-28 – 2022-07-29 (×2): 0.8 mg via ORAL
  Filled 2022-07-28 (×2): qty 2

## 2022-07-28 NOTE — TOC Progression Note (Signed)
Transition of Care [facility]) - Progression Note    Patient Details  Name: Mitchell Price MRN: [id number] Date of Birth: May 15, 1946  Transition of Care [facility]) CM/SW [place], RN Phone Number: 07/28/2022, 2:33 PM  Clinical Narrative:     TOC continues to follow the patient I reached out to the physician to see fi and when they will plan a voiding trial, his Group home will not accept him back with a foley His son is not able to care for him at home.  Expected Discharge Plan: Assisted Living Barriers to Discharge: ED Unsafe disposition  Expected Discharge Plan and Services   Discharge Planning Services: CM Consult                     DME Arranged: N/A DME Agency: NA       HH Arranged: RN           Social Determinants of Health (SDOH) Interventions SDOH Screenings   Food Insecurity: Unknown (07/23/2022)  Transportation Needs: Unknown (07/23/2022)  Utilities: Unknown (07/23/2022)  Tobacco Use: High Risk (07/23/2022)    Readmission Risk Interventions     No data to display

## 2022-07-28 NOTE — Inpatient Diabetes Management (Signed)
Inpatient Diabetes Program Recommendations  AACE/ADA: New Consensus Statement on Inpatient Glycemic Control (2015)  Target Ranges:  Prepandial:   less than 140 mg/dL      Peak postprandial:   less than 180 mg/dL (1-2 hours)      Critically ill patients:  140 - 180 mg/dL   Lab Results  Component Value Date   GLUCAP 121 (H) 07/28/2022   HGBA1C 9.0 (H) 07/28/2022     Latest Reference Range & Units 07/27/22 07:57 07/27/22 11:14 07/27/22 16:30 07/27/22 21:55 07/28/22 07:50  Glucose-Capillary 70 - 99 mg/dL 101 (H) 215 (H) 212 (H) 154 (H) 121 (H)   Home DM Meds: Metformin 1000 mg BID     Actos 30 mg daily     Jardiance 25 mg daily     Amaryl 4 mg daily       Current Orders: Semglee 15 units QHS                            Novolog Moderate Correction Scale/ SSI (0-15 units) TID AC + HS, Metformin 1 gm bid, Actos 45 mg qd, Amaryl 4 mg Daily                              Glucose trends increase after meal intake   1. Add Novolog 2-3 units tid meal coverage.     Thanks, Tama Headings RN, MSN, BC-ADM Inpatient Diabetes Coordinator Team Pager [phone] (8a-5p)

## 2022-07-28 NOTE — Progress Notes (Signed)
Progress Note   Patient: Mitchell Price [id number] DOB: Oct 05, 1945 DOA: 07/23/2022     4 DOS: the patient was seen and examined on 07/28/2022   Brief hospital course: 77 y.o. male with medical history significant of type 2 diabetes, hypertension, DVT on Eliquis, PUD, diverticulosis, urinary retention, dementia, who presents to the ED due to placement difficulties.   Mr. Skubic states he does not have any complaints at this time.  He denies any chest pain, palpitations, shortness of breath or abdominal pain.    Patient was recently hospitalized from 06/17/2022 - 07/23/2022, discharged this AM.  Per discharge summary, patient was admitted for AKI secondary to dehydration in the setting of diarrhea.  He was treated with 5 days of Florastor.  Additional comorbidities managed during hospitalization includes dementia, uncontrolled type 2 diabetes, hypertension, dyslipidemia, diabetic neuropathy, insomnia.  Hospital stay was complicated by urinary retention, s/p failed voiding challenge.  Due to this, Foley was replaced on 07/22/2022 with plans for repeating voiding challenge in 1 to 2 weeks.   ED Course:  On arrival to the ED, patient was normotensive at 123/79 with heart rate of 91.  He was saturating at 94% on room air.  He was afebrile at 98.5.  Due to unsafe disposition and lack of current placement options, [facility] contacted for admission.  Assessment and Plan: * Urinary retention Patient has a history of recent urinary retention with a failed voiding challenge on 07/22/2022 with inability to void and bladder scan demonstrating over 600 cc.  Due to Foley, patient was unable to go to previously arranged group home.  Added Proscar.  Continue Flomax and increased dose.  Will need another trial to see if he can urinate.  Stage 3a chronic kidney disease (CKD) (HCC) Creatinine variable during last hospital course in this hospital course.  Current creatinine 1.36 with a GFR 53  Uncontrolled type 2  diabetes mellitus with hyperglycemia, with long-term current use of insulin (HCC) Patient on Semglee insulin 15 units at night sliding Scale plus Amaryl metformin and Actos.  Current Hemoglobin A1c elevated at 9.  Hemoglobin A1c elevated at 14 back in November.  Dyslipidemia Continue Lipitor  History of DVT (deep vein thrombosis) Eliquis  Essential hypertension Continue metoprolol  Dementia without behavioral disturbance (HCC) Continue home Seroquel and trazodone        Subjective: Patient feels okay.  Offers no complaints.  Readmitted because the group home could not take a patient with a Foley catheter.  Physical Exam: Vitals:   07/27/22 1420 07/27/22 2156 07/28/22 0915 07/28/22 0940  BP: 102/64 128/70 108/61 108/61  Pulse: 77 86 81 81  Resp: 17 18 17 18  $ Temp: 98.1 F (36.7 C) 98.3 F (36.8 C) 98.3 F (36.8 C) 98.3 F (36.8 C)  TempSrc:   Oral   SpO2: 100% 100% 98%   Weight:       Physical Exam HENT:     Head: Normocephalic.     Mouth/Throat:     Pharynx: No oropharyngeal exudate.  Eyes:     General: Lids are normal.     Conjunctiva/sclera: Conjunctivae normal.  Cardiovascular:     Rate and Rhythm: Normal rate and regular rhythm.     Heart sounds: Normal heart sounds, S1 normal and S2 normal.  Pulmonary:     Breath sounds: No decreased breath sounds, wheezing, rhonchi or rales.  Abdominal:     Palpations: Abdomen is soft.     Tenderness: There is no abdominal tenderness.  Musculoskeletal:  Right lower leg: No swelling.     Left lower leg: No swelling.  Skin:    General: Skin is warm.     Findings: No rash.  Neurological:     Mental Status: He is alert.     Data Reviewed: Hemoglobin A1c 9, last hemoglobin 11.7, creatinine 1.36  Family Communication: Spoke with family yesterday  Disposition: Status is: Inpatient Remains inpatient appropriate because: Group home will not take him with a Foley catheter.  Will run this case by urology to see when  a second void trial should happen.  Planned Discharge Destination: Group home    Time spent: 27 minutes  Author: Loletha Grayer, MD 07/28/2022 1:58 PM  For on call review [email or web address].

## 2022-07-28 NOTE — Plan of Care (Signed)

## 2022-07-29 LAB — GLUCOSE, CAPILLARY
Glucose-Capillary: 137 mg/dL — ABNORMAL HIGH (ref 70–99)
Glucose-Capillary: 143 mg/dL — ABNORMAL HIGH (ref 70–99)
Glucose-Capillary: 249 mg/dL — ABNORMAL HIGH (ref 70–99)

## 2022-07-29 NOTE — TOC Progression Note (Signed)
Transition of Care [facility]) - Progression Note    Patient Details  Name: Mitchell Price MRN: [id number] Date of Birth: 07-04-1945  Transition of Care [facility]) CM/SW [place], RN Phone Number: 07/29/2022, 4:06 PM  Clinical Narrative:   [place].  is where the patient will go at DC  Met with the patient and his brother in the room and explained that the patient's son Mitchell Price is the one who chose the family care home I explained that he will likely DC tomorrow     Expected Discharge Plan: Assisted Living Barriers to Discharge: ED Unsafe disposition  Expected Discharge Plan and Services   Discharge Planning Services: CM Consult                     DME Arranged: N/A DME Agency: NA       HH Arranged: RN           Social Determinants of Health ([place]) Interventions SDOH Screenings   Food Insecurity: Unknown (07/23/2022)  Transportation Needs: Unknown (07/23/2022)  Utilities: Unknown (07/23/2022)  Tobacco Use: High Risk (07/23/2022)    Readmission Risk Interventions     No data to display

## 2022-07-29 NOTE — TOC Progression Note (Signed)
Transition of Care [facility]) - Progression Note    Patient Details  Name: Mitchell Price MRN: [id number] Date of Birth: September 06, 1945  Transition of Care [facility]) CM/SW [place], RN Phone Number: 07/29/2022, 4:30 PM  Clinical Narrative:     Spoke with Richard his son and explained that the foley was removed and that he is voiding on his own, He will DC back to the family care home tomorrow, he stated that they are trying to discharge to a closer place, I explained that until they are able to find a closer place he will have to DC to the family care home and then can move once they find a new place, he stated understanding, he asked if we can have the Family care home can transport him called Kasey at the [facility] home He stated that he will see about getting the patient picked up tomorrow but will speak to the son richard first about the Special assistance medicaid and picking up the patient  for discharge he will call me back in the morning    Expected Discharge Plan: Assisted Living Barriers to Discharge: ED Unsafe disposition  Expected Discharge Plan and Services   Discharge Planning Services: CM Consult                     DME Arranged: N/A DME Agency: NA       HH Arranged: RN           Social Determinants of Health (SDOH) Interventions SDOH Screenings   Food Insecurity: Unknown (07/23/2022)  Transportation Needs: Unknown (07/23/2022)  Utilities: Unknown (07/23/2022)  Tobacco Use: High Risk (07/23/2022)    Readmission Risk Interventions     No data to display

## 2022-07-29 NOTE — Plan of Care (Signed)

## 2022-07-29 NOTE — Progress Notes (Signed)
Progress Note   Patient: Mitchell Price [id number] DOB: 1945/09/13 DOA: 07/23/2022     5 DOS: the patient was seen and examined on 07/29/2022   Brief hospital course: 77 y.o. male with medical history significant of type 2 diabetes, hypertension, DVT on Eliquis, PUD, diverticulosis, urinary retention, dementia, who presents to the ED due to placement difficulties.   Mr. Anez states he does not have any complaints at this time.  He denies any chest pain, palpitations, shortness of breath or abdominal pain.    Patient was recently hospitalized from 06/17/2022 - 07/23/2022, discharged this AM.  Per discharge summary, patient was admitted for AKI secondary to dehydration in the setting of diarrhea.  He was treated with 5 days of Florastor.  Additional comorbidities managed during hospitalization includes dementia, uncontrolled type 2 diabetes, hypertension, dyslipidemia, diabetic neuropathy, insomnia.  Hospital stay was complicated by urinary retention, s/p failed voiding challenge.  Due to this, Foley was replaced on 07/22/2022 with plans for repeating voiding challenge in 1 to 2 weeks.   ED Course:  On arrival to the ED, patient was normotensive at 123/79 with heart rate of 91.  He was saturating at 94% on room air.  He was afebrile at 98.5.  Due to unsafe disposition and lack of current placement options, [facility] contacted for admission.  2/21: Patient with disposition issues as prior group home is refusing to take him back with Foley catheter.  Failed voiding trial on 2/14, will try second time today.  Patient is able to void after removal of Foley catheter.  Hoping he can go back to his prior group home tomorrow.  Assessment and Plan: * Urinary retention Improved.  Foley catheter was removed today and patient was able to void. Patient has a history of recent urinary retention with a failed voiding challenge on 07/22/2022 with inability to void and bladder scan demonstrating over 600 cc.  Due to  Foley, patient was unable to go to previously arranged group home.  Added Proscar.  Continue Flomax and increased dose.  Hopefully can go back to his group home tomorrow  Stage 3a chronic kidney disease (CKD) ([place]) Creatinine variable during last hospital course in this hospital course.  Current creatinine 1.36 with a GFR 53  Uncontrolled type 2 diabetes mellitus with hyperglycemia, with long-term current use of insulin (HCC) Patient on Semglee insulin 15 units at night sliding Scale plus Amaryl metformin and Actos.  Current Hemoglobin A1c elevated at 9.  Hemoglobin A1c elevated at 14 back in November.  Dyslipidemia Continue Lipitor  History of DVT (deep vein thrombosis) Eliquis  Essential hypertension Continue metoprolol  Dementia without behavioral disturbance (HCC) Continue home Seroquel and trazodone   Subjective: Patient was sleeping when seen today.  Denies any pain.  He wants to get some sleep.  Sitter at bedside  Physical Exam: Vitals:   07/28/22 1458 07/28/22 2226 07/28/22 2357 07/29/22 0841  BP: 123/83 135/83  123/80  Pulse: 76 83  75  Resp: 17 18  16  $ Temp: 99991111 F (36.9 C)   98.2 F (36.8 C)  TempSrc:      SpO2: 99% 97%  98%  Weight:   84 kg   Height:   6' 5"$  (1.956 m)    General.  Somnolent elderly man, in no acute distress. Pulmonary.  Lungs clear bilaterally, normal respiratory effort. CV.  Regular rate and rhythm, no JVD, rub or murmur. Abdomen.  Soft, nontender, nondistended, BS positive. CNS.  Somnolent, no focal neurologic deficit.  Extremities.  No edema, no cyanosis, pulses intact and symmetrical.   Data Reviewed: Prior data reviewed  Family Communication: Talked with son on phone  Disposition: Status is: Inpatient Remains inpatient appropriate because: Group home will not take him with a Foley catheter.  Will run this case by urology to see when a second void trial should happen.  Planned Discharge Destination: Group home  DVT prophylaxis.   Eliquis Time spent: 38 minutes  This record has been created using Systems analyst. Errors have been sought and corrected,but may not always be located. Such creation errors do not reflect on the standard of care.   Author: Lorella Nimrod, MD 07/29/2022 3:51 PM  For on call review [email or web address].

## 2022-07-30 LAB — GLUCOSE, CAPILLARY
Glucose-Capillary: 120 mg/dL — ABNORMAL HIGH (ref 70–99)
Glucose-Capillary: 255 mg/dL — ABNORMAL HIGH (ref 70–99)

## 2022-07-30 MED ORDER — TAMSULOSIN HCL 0.4 MG PO CAPS: 0.8000 mg | ORAL_CAPSULE | Freq: Every day | ORAL | 2 refills | Status: AC

## 2022-07-30 MED ORDER — FINASTERIDE 5 MG PO TABS: 5.0000 mg | ORAL_TABLET | Freq: Every day | ORAL | 2 refills | Status: AC

## 2022-07-30 NOTE — TOC Progression Note (Addendum)
Transition of Care [facility]) - Progression Note    Patient Details  Name: Mitchell Price MRN: [id number] Date of Birth: 29-Feb-1946  Transition of Care [facility]) CM/SW [place], RN Phone Number: 07/30/2022, 10:22 AM  Clinical Narrative:   [place] [employer] called back and stated that he will need DC summary faxed, his staff will come today to transport, they will call me to let me know a time, they need any meds sent to the pharmacy for a 30 day supply as they are going to set him up with a new PCP Fax number [phone]     Expected Discharge Plan: Assisted Living Barriers to Discharge: ED Unsafe disposition  Expected Discharge Plan and Services   Discharge Planning Services: CM Consult                     DME Arranged: N/A DME Agency: NA       HH Arranged: RN           Social Determinants of Health (SDOH) Interventions SDOH Screenings   Food Insecurity: Unknown (07/23/2022)  Transportation Needs: Unknown (07/23/2022)  Utilities: Unknown (07/23/2022)  Tobacco Use: High Risk (07/23/2022)    Readmission Risk Interventions     No data to display

## 2022-07-30 NOTE — Plan of Care (Signed)

## 2022-07-30 NOTE — Plan of Care (Signed)
  Problem: Health Behavior/Discharge Planning: Goal: Ability to manage health-related needs will improve Outcome: Progressing   Problem: Clinical Measurements: Goal: Will remain free from infection Outcome: Progressing   Problem: Clinical Measurements: Goal: Respiratory complications will improve Outcome: Progressing   Problem: Clinical Measurements: Goal: Cardiovascular complication will be avoided Outcome: Progressing   Problem: Elimination: Goal: Will not experience complications related to urinary retention Outcome: Progressing   Problem: Pain Managment: Goal: General experience of comfort will improve Outcome: Progressing

## 2022-07-30 NOTE — Discharge Summary (Signed)
Physician Discharge Summary   Patient: Mitchell Price MRN: [id number] DOB: Nov 04, 1945  Admit date:     07/23/2022  Discharge date: 07/30/22  Discharge Physician: Lorella Nimrod   PCP: Doretha Imus, PA-C   Recommendations at discharge:  Please obtain CBC and BMP in 1 week Follow-up with primary care provider within a week Follow-up with urology  Discharge Diagnoses: Principal Problem:   Urinary retention Active Problems:   Stage 3a chronic kidney disease (CKD) ([place])   Uncontrolled type 2 diabetes mellitus with hyperglycemia, with long-term current use of insulin (HCC)   Dyslipidemia   History of DVT (deep vein thrombosis)   Essential hypertension   Dementia without behavioral disturbance ([place])   Goals of care, counseling/discussion   Hospital Course: 77 y.o. male with medical history significant of type 2 diabetes, hypertension, DVT on Eliquis, PUD, diverticulosis, urinary retention, dementia, who presents to the ED due to placement difficulties.   Mr. Mitchell Price states he does not have any complaints at this time.  He denies any chest pain, palpitations, shortness of breath or abdominal pain.    Patient was recently hospitalized from 06/17/2022 - 07/23/2022, discharged this AM.  Per discharge summary, patient was admitted for AKI secondary to dehydration in the setting of diarrhea.  He was treated with 5 days of Florastor.  Additional comorbidities managed during hospitalization includes dementia, uncontrolled type 2 diabetes, hypertension, dyslipidemia, diabetic neuropathy, insomnia.  Hospital stay was complicated by urinary retention, s/p failed voiding challenge.  Due to this, Foley was replaced on 07/22/2022 with plans for repeating voiding challenge in 1 to 2 weeks.   ED Course:  On arrival to the ED, patient was normotensive at 123/79 with heart rate of 91.  He was saturating at 94% on room air.  He was afebrile at 98.5.  Due to unsafe disposition and lack of current placement  options, TRH contacted for admission.  2/21: Patient with disposition issues as prior group home is refusing to take him back with Foley catheter.  Failed voiding trial on 2/14, will try second time today.  Patient is able to void after removal of Foley catheter.  Hoping he can go back to his prior group home tomorrow.  2/22: Remained hemodynamically stable.  Voiding well with no significant postvoid volume. He is being discharged back to his group home with increased dose of Flomax and also added Proscar to help with BPH symptoms.  Patient need to follow-up with urology for further recommendations.  He will continue with rest of his home medications and need to have a close follow-up with PCP for further management.  Assessment and Plan: * Urinary retention Improved.  Foley catheter was removed today and patient was able to void. Patient has a history of recent urinary retention with a failed voiding challenge on 07/22/2022 with inability to void and bladder scan demonstrating over 600 cc.  Due to Foley, patient was unable to go to previously arranged group home.  Added Proscar.  Continue Flomax and increased dose.  Hopefully can go back to his group home tomorrow  Stage 3a chronic kidney disease (CKD) ([place]) Creatinine variable during last hospital course in this hospital course.  Current creatinine 1.36 with a GFR 53  Uncontrolled type 2 diabetes mellitus with hyperglycemia, with long-term current use of insulin (HCC) Patient on Semglee insulin 15 units at night sliding Scale plus Amaryl metformin and Actos.  Current Hemoglobin A1c elevated at 9.  Hemoglobin A1c elevated at 14 back in November.  Dyslipidemia  Continue Lipitor  History of DVT (deep vein thrombosis) Eliquis  Essential hypertension Continue metoprolol  Dementia without behavioral disturbance ([place]) Continue home Seroquel and trazodone   Consultants: None Procedures performed: None  Disposition: Group home Diet  recommendation:  Discharge Diet Orders (From admission, onward)     Start     Ordered   07/30/22 0000  Diet - low sodium heart healthy        07/30/22 1041           Cardiac and Carb modified diet DISCHARGE MEDICATION: Allergies as of 07/30/2022   No Known Allergies      Medication List     TAKE these medications    acetaminophen 500 MG tablet Commonly known as: TYLENOL Take 1 tablet (500 mg total) by mouth every 6 (six) hours as needed for mild pain.   apixaban 5 MG Tabs tablet Commonly known as: Eliquis Take 1 tablet (5 mg total) by mouth 2 (two) times daily. TAKE 1 TABLET(5 MG) BY MOUTH TWICE DAILY   atorvastatin 40 MG tablet Commonly known as: LIPITOR Take 1 tablet (40 mg total) by mouth daily.   docusate sodium 100 MG capsule Commonly known as: COLACE Take 1 capsule (100 mg total) by mouth 2 (two) times daily as needed for mild constipation.   empagliflozin 25 MG Tabs tablet Commonly known as: JARDIANCE Take 1 tablet (25 mg total) by mouth daily.   ferrous sulfate 325 (65 FE) MG EC tablet Take 1 tablet (325 mg total) by mouth daily with breakfast.   finasteride 5 MG tablet Commonly known as: PROSCAR Take 1 tablet (5 mg total) by mouth daily. Start taking on: July 31, 2022   gabapentin 300 MG capsule Commonly known as: NEURONTIN Take 1 capsule (300 mg total) by mouth daily.   glimepiride 4 MG tablet Commonly known as: AMARYL Take 1 tablet (4 mg total) by mouth every morning.   melatonin 5 MG Tabs Take 1 tablet (5 mg total) by mouth at bedtime.   metFORMIN 1000 MG tablet Commonly known as: GLUCOPHAGE Take 1 tablet (1,000 mg total) by mouth 2 (two) times daily.   metoprolol tartrate 25 MG tablet Commonly known as: LOPRESSOR Take 1 tablet (25 mg total) by mouth 2 (two) times daily.   pioglitazone 45 MG tablet Commonly known as: ACTOS Take 1 tablet (45 mg total) by mouth daily.   QUEtiapine 25 MG tablet Commonly known as: SEROQUEL Take 1  tablet (25 mg total) by mouth 2 (two) times daily.   tamsulosin 0.4 MG Caps capsule Commonly known as: FLOMAX Take 2 capsules (0.8 mg total) by mouth daily after supper. What changed: how much to take   traZODone 50 MG tablet Commonly known as: DESYREL Take 0.5 tablets (25 mg total) by mouth at bedtime.        Follow-up Information     Doretha Imus, PA-C. Schedule an appointment as soon as possible for a visit in 1 week(s).   Specialty: Gastroenterology Contact information: [id number] [place] [address] [phone]                Discharge Exam: Danley Danker Weights   07/24/22 1200 07/28/22 2357  Weight: 84 kg 84 kg   General.  Frail elderly man, in no acute distress. Pulmonary.  Lungs clear bilaterally, normal respiratory effort. CV.  Regular rate and rhythm, no JVD, rub or murmur. Abdomen.  Soft, nontender, nondistended, BS positive. CNS.  Alert and oriented .  No  focal neurologic deficit. Extremities.  No edema, no cyanosis, pulses intact and symmetrical. Psychiatry.  Judgment and insight appears impaired.  Condition at discharge: stable  The results of significant diagnostics from this hospitalization (including imaging, microbiology, ancillary and laboratory) are listed below for reference.   Imaging Studies: US SCROTUM W/DOPPLER  Result Date: 07/18/2022 CLINICAL DATA:  [id number] Testicular discomfort [id number] EXAM: ULTRASOUND OF SCROTUM TECHNIQUE: Complete ultrasound examination of the testicles, epididymis, and other scrotal structures was performed. COMPARISON:  None Available. FINDINGS: The right testis measured 3.9 x 2.9 x 1.9 cm, and the left testis measured 3.7 x 2.8 x 1.8 cm. The testes demonstrated blood flow with Doppler. There is a cysts identified of the tunica albuginea on the left measuring 0.4 cm. Left epididymal cyst measures 0.4 cm. No fluid collections or masses were seen. IMPRESSION: Cysts identified left epididymis and tunica  albuginea. No acute scrotal pathology or testicular abnormalities. Electronically Signed   By: Sammie Bench M.D.   On: 07/18/2022 11:22   CT HEAD WO CONTRAST (5MM)  Result Date: 07/15/2022 CLINICAL DATA:  Head trauma. EXAM: CT HEAD WITHOUT CONTRAST TECHNIQUE: Contiguous axial images were obtained from the base of the skull through the vertex without intravenous contrast. RADIATION DOSE REDUCTION: This exam was performed according to the departmental dose-optimization program which includes automated exposure control, adjustment of the mA and/or kV according to patient size and/or use of iterative reconstruction technique. COMPARISON:  Head CT 06/23/2022. FINDINGS: Brain: No acute hemorrhage, mass effect or midline shift. Stable chronic small-vessel disease. Gray-white differentiation is otherwise preserved. No hydrocephalus. No extra-axial collection. Basilar cisterns are patent. Vascular: No hyperdense vessel or unexpected calcification. Skull: No calvarial fracture or suspicious bone lesion. Skull base is unremarkable. Sinuses/Orbits: Partial opacification of the ethmoid air cells. Mastoids are well aerated. Orbits are unremarkable. Other: No scalp hematoma. IMPRESSION: 1. No acute intracranial injury or calvarial fracture. 2. Stable chronic small-vessel disease. Electronically Signed   By: Emmit Alexanders M.D.   On: 07/15/2022 08:55   CT ABDOMEN PELVIS W CONTRAST  Result Date: 07/08/2022 CLINICAL DATA:  Recurrent diarrhea and right-sided abdominal pain EXAM: CT ABDOMEN AND PELVIS WITH CONTRAST TECHNIQUE: Multidetector CT imaging of the abdomen and pelvis was performed using the standard protocol following bolus administration of intravenous contrast. RADIATION DOSE REDUCTION: This exam was performed according to the departmental dose-optimization program which includes automated exposure control, adjustment of the mA and/or kV according to patient size and/or use of iterative reconstruction technique.  CONTRAST:  1101m OMNIPAQUE IOHEXOL 300 MG/ML  SOLN COMPARISON:  None Available. FINDINGS: Lower chest: Advanced coronary artery calcification. Emphysema. Right basilar atelectasis/scarring. Hepatobiliary: Cholelithiasis. No biliary dilation. No solid hepatic lesion. Pancreas: Unremarkable. Spleen: Unremarkable. Adrenals/Urinary Tract: Normal adrenal glands. Multiple low-attenuation lesions in the kidneys bilaterally are compatible with benign cysts. Hyperdense lesion extending off the posterior right kidney measuring 3.2 cm and hyperdense lesion off the inferior pole of the left kidney measuring 2.6 cm may represent hemorrhagic/proteinaceous cyst however are indeterminate. No urinary calculi or hydronephrosis. Unremarkable bladder. Stomach/Bowel: Stomach is within normal limits. Appendix appears normal. Normal caliber large and small bowel. No wall thickening, inflammatory change, or sign of ischemia. Vascular/Lymphatic: Aortic atherosclerosis. No enlarged abdominal or pelvic lymph nodes. Reproductive: Enlarged prostate. Other: No free intraperitoneal fluid or air. Asymmetric focal fat deposition within the right retroperitoneum with minimal if any stranding. Musculoskeletal: Body wall edema.  No acute osseous abnormality. IMPRESSION: No acute abnormality in the abdomen or pelvis. Bilateral hyperdense renal lesions may  be hemorrhagic/proteinaceous cysts however further evaluation with nonemergent MRI with and without contrast is recommended to exclude malignancy. Cholelithiasis. Body wall edema. Electronically Signed   By: Placido Sou M.D.   On: 07/08/2022 03:27    Microbiology: Results for orders placed or performed during the hospital encounter of 06/17/22  Resp panel by RT-PCR (RSV, Flu A&B, Covid) Anterior Nasal Swab     Status: None   Collection Time: 07/08/22  2:24 AM   Specimen: Anterior Nasal Swab  Result Value Ref Range Status   SARS Coronavirus 2 by RT PCR NEGATIVE NEGATIVE Final    Comment:  (NOTE) SARS-CoV-2 target nucleic acids are NOT DETECTED.  The SARS-CoV-2 RNA is generally detectable in upper respiratory specimens during the acute phase of infection. The lowest concentration of SARS-CoV-2 viral copies this assay can detect is 138 copies/mL. A negative result does not preclude SARS-Cov-2 infection and should not be used as the sole basis for treatment or other patient management decisions. A negative result may occur with  improper specimen collection/handling, submission of specimen other than nasopharyngeal swab, presence of viral mutation(s) within the areas targeted by this assay, and inadequate number of viral copies(<138 copies/mL). A negative result must be combined with clinical observations, patient history, and epidemiological information. The expected result is Negative.  Fact Sheet for Patients:  EntrepreneurPulse.com.au  Fact Sheet for Healthcare Providers:  IncredibleEmployment.be  This test is no t yet approved or cleared by the [place] FDA and  has been authorized for detection and/or diagnosis of SARS-CoV-2 by FDA under an Emergency Use Authorization (EUA). This EUA will remain  in effect (meaning this test can be used) for the duration of the COVID-19 declaration under Section 564(b)(1) of the Act, 21 U.S.C.section 360bbb-3(b)(1), unless the authorization is terminated  or revoked sooner.       Influenza A by PCR NEGATIVE NEGATIVE Final   Influenza B by PCR NEGATIVE NEGATIVE Final    Comment: (NOTE) The Xpert Xpress SARS-CoV-2/FLU/RSV plus assay is intended as an aid in the diagnosis of influenza from Nasopharyngeal swab specimens and should not be used as a sole basis for treatment. Nasal washings and aspirates are unacceptable for Xpert Xpress SARS-CoV-2/FLU/RSV testing.  Fact Sheet for Patients: EntrepreneurPulse.com.au  Fact Sheet for Healthcare  Providers: IncredibleEmployment.be  This test is not yet approved or cleared by the [place] FDA and has been authorized for detection and/or diagnosis of SARS-CoV-2 by FDA under an Emergency Use Authorization (EUA). This EUA will remain in effect (meaning this test can be used) for the duration of the COVID-19 declaration under Section 564(b)(1) of the Act, 21 U.S.C. section 360bbb-3(b)(1), unless the authorization is terminated or revoked.     Resp Syncytial Virus by PCR NEGATIVE NEGATIVE Final    Comment: (NOTE) Fact Sheet for Patients: EntrepreneurPulse.com.au  Fact Sheet for Healthcare Providers: IncredibleEmployment.be  This test is not yet approved or cleared by the [place] FDA and has been authorized for detection and/or diagnosis of SARS-CoV-2 by FDA under an Emergency Use Authorization (EUA). This EUA will remain in effect (meaning this test can be used) for the duration of the COVID-19 declaration under Section 564(b)(1) of the Act, 21 U.S.C. section 360bbb-3(b)(1), unless the authorization is terminated or revoked.  Performed at [facility], [address] [place], [place] [address]     Labs: CBC: Recent Labs  Lab 07/27/22 0619  WBC 5.6  HGB 11.7*  HCT 36.1*  MCV 100.0  PLT 284  Basic Metabolic Panel: Recent Labs  Lab 07/24/22 0135 07/27/22 0619 07/28/22 0146  NA  --  136  --   K  --  4.6  --   CL  --  105  --   CO2  --  24  --   GLUCOSE 436* 115*  --   BUN  --  48*  --   CREATININE  --  1.45* 1.36*  CALCIUM  --  9.1  --    Liver Function Tests: No results for input(s): "AST", "ALT", "ALKPHOS", "BILITOT", "PROT", "ALBUMIN" in the last 168 hours. CBG: Recent Labs  Lab 07/28/22 2133 07/29/22 0843 07/29/22 1131 07/29/22 2133 07/30/22 0844  GLUCAP 189* 137* 143* 249* 120*    Discharge time spent: greater than 30 minutes.  This record has been created using  Systems analyst. Errors have been sought and corrected,but may not always be located. Such creation errors do not reflect on the standard of care.   Signed: Lorella Nimrod, MD [facility] 07/30/2022

## 2022-07-30 NOTE — NC FL2 (Signed)
[place] LEVEL OF CARE FORM     IDENTIFICATION  Patient Name: Mitchell Price Birthdate: August 05, 1945 Sex: male Admission Date (Current Location): 07/23/2022  [facility] and [place] Number:  Engineering geologist and Address:  [facility], [address], [place] [employer], [place] [address]      Provider Number: [id number]  Attending Physician Name and Address:  Lorella Nimrod, MD  Relative Name and Phone Number:  Richard Harris-[phone]    Current Level of Care: Hospital Recommended Level of Care: [place], [place] Prior Approval Number:    Date Approved/Denied:   PASRR Number:    Discharge Plan: Other (Comment) ([facility]/ ALF)    Current Diagnoses: Patient Active Problem List   Diagnosis Date Noted   Urinary retention 07/23/2022   Goals of care, counseling/discussion 07/23/2022   Altered mental status 07/09/2022   Fall 07/09/2022   Hyperglycemia 07/09/2022   Diarrhea of infectious origin 07/09/2022   Intractable diarrhea 07/08/2022   Uncontrolled type 2 diabetes mellitus with hyperglycemia, with long-term current use of insulin ([place]) 07/08/2022   Diabetic neuropathy ([place]) 07/08/2022   Dyslipidemia 07/08/2022   History of DVT (deep vein thrombosis) 07/08/2022   Essential hypertension 07/08/2022   Dementia without behavioral disturbance ([place]) 06/19/2022   Stage 3a chronic kidney disease (CKD) ([place]) 09/12/2019   Melena 09/12/2019   Acute blood loss anemia 09/12/2019   History of hemorrhoids 09/12/2019   Type 2 diabetes mellitus with stage 3 chronic kidney disease ([place]) 09/12/2019   PUD (peptic ulcer disease) 09/12/2019   HTN (hypertension) 09/12/2019   Upper GI bleed 09/12/2019   Hyperglycemia due to type 2 diabetes mellitus ([place]) 09/12/2019   BPH (benign prostatic hyperplasia) 01/17/2019   HLD (hyperlipidemia) 01/17/2019   Multiple thyroid nodules 07/18/2017   Grade III hemorrhoids    Acute GI bleeding  07/13/2017   GIB (gastrointestinal bleeding) 07/12/2017   Acidosis, hyperchloremic 12/13/2015   Erectile dysfunction due to arterial insufficiency 04/13/2014   Diverticulosis 08/25/2013   Internal hemorrhoid, bleeding 08/25/2013   Diabetes type 2, uncontrolled 08/22/2013   Renal mass 08/21/2013   Lens replaced by other means 01/26/2013   Status post cataract extraction 01/26/2013   Anemia 04/01/2012    Orientation RESPIRATION BLADDER Height & Weight     Self, Place  Normal Indwelling catheter Weight: 84 kg Height:  6' 5"$  (195.6 cm)  BEHAVIORAL SYMPTOMS/MOOD NEUROLOGICAL BOWEL NUTRITION STATUS      Continent Diet (Regular)  AMBULATORY STATUS COMMUNICATION OF NEEDS Skin   Supervision Verbally Normal                       Personal Care Assistance Level of Assistance  Bathing, Feeding, Dressing Bathing Assistance: Limited assistance Feeding assistance: Limited assistance Dressing Assistance: Independent     Functional Limitations Info  Sight, Hearing, Speech Sight Info: Adequate Hearing Info: Adequate Speech Info: Adequate    SPECIAL CARE FACTORS FREQUENCY                       Contractures Contractures Info: Not present    Additional Factors Info  Code Status, Allergies Code Status Info: Full Allergies Info: NKDA           Current Medications (07/30/2022):  This is the current hospital active medication list Current Facility-Administered Medications  Medication Dose Route Frequency Provider Last Rate Last Admin   acetaminophen (TYLENOL) tablet 650 mg  650 mg Oral Q6H PRN Jose Persia,  MD   650 mg at 07/26/22 [id number]   Or   acetaminophen (TYLENOL) suppository 650 mg  650 mg Rectal Q6H PRN Jose Persia, MD       apixaban (ELIQUIS) tablet 5 mg  5 mg Oral BID Jose Persia, MD   5 mg at 07/30/22 0916   atorvastatin (LIPITOR) tablet 40 mg  40 mg Oral Daily Jose Persia, MD   40 mg at 07/30/22 0915   Chlorhexidine Gluconate Cloth 2 % PADS 6 each   6 each Topical Daily Fritzi Mandes, MD   6 each at 07/29/22 0909   finasteride (PROSCAR) tablet 5 mg  5 mg Oral Daily Loletha Grayer, MD   5 mg at 07/30/22 0916   gabapentin (NEURONTIN) capsule 300 mg  300 mg Oral Daily Jose Persia, MD   300 mg at 07/30/22 0916   glimepiride (AMARYL) tablet 4 mg  4 mg Oral Chong Sicilian, Gus Height, MD   4 mg at 07/30/22 0606   insulin aspart (novoLOG) injection 0-15 Units  0-15 Units Subcutaneous TID AC & HS Sharion Settler, NP   5 Units at 07/29/22 2205   insulin glargine-yfgn (SEMGLEE) injection 15 Units  15 Units Subcutaneous QHS Fritzi Mandes, MD   15 Units at 07/29/22 2213   melatonin tablet 5 mg  5 mg Oral QHS Jose Persia, MD   5 mg at 07/29/22 2204   metFORMIN (GLUCOPHAGE) tablet 1,000 mg  1,000 mg Oral BID WC Jose Persia, MD   1,000 mg at 07/30/22 0915   metoprolol tartrate (LOPRESSOR) tablet 25 mg  25 mg Oral BID Jose Persia, MD   25 mg at 07/30/22 0915   ondansetron (ZOFRAN) tablet 4 mg  4 mg Oral Q6H PRN Jose Persia, MD       Or   ondansetron (ZOFRAN) injection 4 mg  4 mg Intravenous Q6H PRN Jose Persia, MD       pioglitazone (ACTOS) tablet 45 mg  45 mg Oral Daily Jose Persia, MD   45 mg at 07/30/22 0916   polyethylene glycol (MIRALAX / GLYCOLAX) packet 17 g  17 g Oral Daily PRN Jose Persia, MD       QUEtiapine (SEROQUEL) tablet 25 mg  25 mg Oral BID Jose Persia, MD   25 mg at 07/30/22 0916   tamsulosin (FLOMAX) capsule 0.8 mg  0.8 mg Oral QPC supper Loletha Grayer, MD   0.8 mg at 07/29/22 2205   traZODone (DESYREL) tablet 25 mg  25 mg Oral QHS Jose Persia, MD   25 mg at 07/29/22 2204     Discharge Medications: acetaminophen 500 MG tablet Commonly known as: TYLENOL Take 1 tablet (500 mg total) by mouth every 6 (six) hours as needed for mild pain.           apixaban 5 MG Tabs tablet Commonly known as: Eliquis Take 1 tablet (5 mg total) by mouth 2 (two) times daily. TAKE 1 TABLET(5 MG) BY MOUTH TWICE DAILY           atorvastatin 40 MG tablet Commonly known as: LIPITOR Take 1 tablet (40 mg total) by mouth daily.          docusate sodium 100 MG capsule Commonly known as: COLACE Take 1 capsule (100 mg total) by mouth 2 (two) times daily as needed for mild constipation.          empagliflozin 25 MG Tabs tablet Commonly known as: JARDIANCE Take 1 tablet (25 mg total) by mouth daily.  ferrous sulfate 325 (65 FE) MG EC tablet Take 1 tablet (325 mg total) by mouth daily with breakfast.          finasteride 5 MG tablet Commonly known as: PROSCAR Start taking on: July 31, 2022 Take 1 tablet (5 mg total) by mouth daily.          gabapentin 300 MG capsule Commonly known as: NEURONTIN Take 1 capsule (300 mg total) by mouth daily.          glimepiride 4 MG tablet Commonly known as: AMARYL Take 1 tablet (4 mg total) by mouth every morning.          melatonin 5 MG Tabs Take 1 tablet (5 mg total) by mouth at bedtime.          metFORMIN 1000 MG tablet Commonly known as: GLUCOPHAGE Take 1 tablet (1,000 mg total) by mouth 2 (two) times daily.          metoprolol tartrate 25 MG tablet Commonly known as: LOPRESSOR Take 1 tablet (25 mg total) by mouth 2 (two) times daily.          pioglitazone 45 MG tablet Commonly known as: ACTOS Take 1 tablet (45 mg total) by mouth daily.          QUEtiapine 25 MG tablet Commonly known as: SEROQUEL Take 1 tablet (25 mg total) by mouth 2 (two) times daily.          tamsulosin 0.4 MG Caps capsule Commonly known as: FLOMAX Take 2 capsules (0.8 mg total) by mouth daily after supper. What changed: how much to take          traZODone 50 MG tablet Commonly known as: DESYREL Take 0.5 tablets (25 mg total) by mouth at bedtime.          Relevant Imaging Results:  Relevant Lab Results:   Additional Information Special assistance application has been completed by the son with [facility].  Conception Oms, RN

## 2022-07-30 NOTE — TOC Progression Note (Addendum)
Transition of Care [facility]) - Progression Note    Patient Details  Name: Mitchell Price MRN: [id number] Date of Birth: [birth year]  Transition of Care [facility]) CM/SW [place], RN Phone Number: 07/30/2022, 11:04 AM  Clinical Narrative:     Tawni Carnes and DC summary to [phone] and called Kasey to let him know, left vm asking for a call about when transport is coming  I emailed Kasey the [place] and DC summary as well and he stated that he will call his son to see if he can bring his meds he was already on to the Group home and he will not be abel to take him until he gets the meds  I called his son [name] to let him know h will need to take the patient's meds to the group home, he said that he had a whole bag of medication   I got a call back from [place], they will be able to get his medications  Got a call from [place], she is picking up the patient in about an hour I provided her the desk number to call, she will come to the room to get him  Notified his Son [name] that he will be picked up in about an hour   Expected Discharge Plan: Assisted Living Barriers to Discharge: ED Unsafe disposition  Expected Discharge Plan and Services   Discharge Planning Services: CM Consult     Expected Discharge Date: 07/30/22               DME Arranged: N/A DME Agency: NA       HH Arranged: RN           Social Determinants of Health (SDOH) Interventions SDOH Screenings   Food Insecurity: Unknown (07/23/2022)  Transportation Needs: Unknown (07/23/2022)  Utilities: Unknown (07/23/2022)  Tobacco Use: High Risk (07/23/2022)    Readmission Risk Interventions     No data to display

## 2022-07-30 NOTE — Care Management Important Message (Signed)
Important Message  Patient Details  Name: Mitchell Price MRN: [id number] Date of Birth: [birth year]   Medicare Important Message Given:  Yes     Juliann Pulse A Isley Zinni 07/30/2022, 10:52 AM

## 2022-07-30 NOTE — TOC Progression Note (Signed)
Transition of Care [facility]) - Progression Note    Patient Details  Name: Mitchell Price MRN: [id number] Date of Birth: 1946-01-01  Transition of Care [facility]) CM/SW [place], RN Phone Number: 07/30/2022, 9:31 AM  Clinical Narrative:     Althea Grimmer from the family care home at [phone] and left a VM asking for a call back  Expected Discharge Plan: Assisted Living Barriers to Discharge: ED Unsafe disposition  Expected Discharge Plan and Services   Discharge Planning Services: CM Consult                     DME Arranged: N/A DME Agency: NA       HH Arranged: RN           Social Determinants of Health (SDOH) Interventions SDOH Screenings   Food Insecurity: Unknown (07/23/2022)  Transportation Needs: Unknown (07/23/2022)  Utilities: Unknown (07/23/2022)  Tobacco Use: High Risk (07/23/2022)    Readmission Risk Interventions     No data to display

## 2022-08-08 ENCOUNTER — Other Ambulatory Visit: Payer: Self-pay

## 2022-08-08 ENCOUNTER — Encounter (HOSPITAL_COMMUNITY): Payer: Self-pay | Admitting: *Deleted

## 2022-08-08 ENCOUNTER — Observation Stay (HOSPITAL_COMMUNITY)
Admission: EM | Admit: 2022-08-08 | Discharge: 2022-08-11 | Disposition: A | Discharge status: Home / Self Care | Payer: Medicare HMO | Attending: Internal Medicine | Admitting: Internal Medicine

## 2022-08-08 DIAGNOSIS — Z86718 Personal history of other venous thrombosis and embolism: Secondary | ICD-10-CM

## 2022-08-08 DIAGNOSIS — Z7984 Long term (current) use of oral hypoglycemic drugs: Secondary | ICD-10-CM | POA: Diagnosis not present

## 2022-08-08 DIAGNOSIS — N1831 Chronic kidney disease, stage 3a: Secondary | ICD-10-CM

## 2022-08-08 DIAGNOSIS — M6281 Muscle weakness (generalized): Secondary | ICD-10-CM | POA: Insufficient documentation

## 2022-08-08 DIAGNOSIS — R739 Hyperglycemia, unspecified: Secondary | ICD-10-CM

## 2022-08-08 DIAGNOSIS — R2689 Other abnormalities of gait and mobility: Secondary | ICD-10-CM | POA: Insufficient documentation

## 2022-08-08 DIAGNOSIS — Z7901 Long term (current) use of anticoagulants: Secondary | ICD-10-CM | POA: Diagnosis not present

## 2022-08-08 DIAGNOSIS — E1165 Type 2 diabetes mellitus with hyperglycemia: Secondary | ICD-10-CM

## 2022-08-08 DIAGNOSIS — I10 Essential (primary) hypertension: Secondary | ICD-10-CM

## 2022-08-08 DIAGNOSIS — Z79899 Other long term (current) drug therapy: Secondary | ICD-10-CM | POA: Diagnosis not present

## 2022-08-08 DIAGNOSIS — I129 Hypertensive chronic kidney disease with stage 1 through stage 4 chronic kidney disease, or unspecified chronic kidney disease: Secondary | ICD-10-CM | POA: Insufficient documentation

## 2022-08-08 DIAGNOSIS — K279 Peptic ulcer, site unspecified, unspecified as acute or chronic, without hemorrhage or perforation: Secondary | ICD-10-CM | POA: Diagnosis present

## 2022-08-08 DIAGNOSIS — R339 Retention of urine, unspecified: Secondary | ICD-10-CM | POA: Diagnosis not present

## 2022-08-08 DIAGNOSIS — N400 Enlarged prostate without lower urinary tract symptoms: Secondary | ICD-10-CM | POA: Diagnosis present

## 2022-08-08 DIAGNOSIS — E1122 Type 2 diabetes mellitus with diabetic chronic kidney disease: Secondary | ICD-10-CM | POA: Insufficient documentation

## 2022-08-08 DIAGNOSIS — R2681 Unsteadiness on feet: Secondary | ICD-10-CM | POA: Diagnosis not present

## 2022-08-08 DIAGNOSIS — F17210 Nicotine dependence, cigarettes, uncomplicated: Secondary | ICD-10-CM | POA: Diagnosis not present

## 2022-08-08 DIAGNOSIS — F0390 Unspecified dementia without behavioral disturbance: Secondary | ICD-10-CM | POA: Diagnosis not present

## 2022-08-08 LAB — URINALYSIS, ROUTINE W REFLEX MICROSCOPIC
Bacteria, UA: NONE SEEN
Bilirubin Urine: NEGATIVE
Glucose, UA: >=500 mg/dL — AB
Hgb urine dipstick: NEGATIVE
Ketones, ur: NEGATIVE mg/dL
Leukocytes,Ua: NEGATIVE
Nitrite: NEGATIVE
Protein, ur: NEGATIVE mg/dL
Specific Gravity, Urine: 1.025 (ref 1.005–1.030)
pH: 6 (ref 5.0–8.0)

## 2022-08-08 LAB — BASIC METABOLIC PANEL
Anion gap: 11 (ref 5–15)
BUN: 25 mg/dL — ABNORMAL HIGH (ref 8–23)
CO2: 19 mmol/L — ABNORMAL LOW (ref 22–32)
Calcium: 9.1 mg/dL (ref 8.9–10.3)
Chloride: 105 mmol/L (ref 98–111)
Creatinine, Ser: 1.54 mg/dL — ABNORMAL HIGH (ref 0.61–1.24)
GFR, Estimated: 46 mL/min — ABNORMAL LOW (ref >60)
Glucose, Bld: 440 mg/dL — ABNORMAL HIGH (ref 70–99)
Potassium: 4.5 mmol/L (ref 3.5–5.1)
Sodium: 135 mmol/L (ref 135–145)

## 2022-08-08 LAB — CBC
HCT: 38.3 % — ABNORMAL LOW (ref 39.0–52.0)
Hemoglobin: 12.5 g/dL — ABNORMAL LOW (ref 13.0–17.0)
MCH: 32.8 pg (ref 26.0–34.0)
MCHC: 32.6 g/dL (ref 30.0–36.0)
MCV: 100.5 fL — ABNORMAL HIGH (ref 80.0–100.0)
Platelets: 218 10*3/uL (ref 150–400)
RBC: 3.81 MIL/uL — ABNORMAL LOW (ref 4.22–5.81)
RDW: 12.7 % (ref 11.5–15.5)
WBC: 6.6 10*3/uL (ref 4.0–10.5)
nRBC: 0 % (ref 0.0–0.2)

## 2022-08-08 LAB — GLUCOSE, CAPILLARY: Glucose-Capillary: 234 mg/dL — ABNORMAL HIGH (ref 70–99)

## 2022-08-08 LAB — CBG MONITORING, ED
Glucose-Capillary: 457 mg/dL — ABNORMAL HIGH (ref 70–99)
Glucose-Capillary: 321 mg/dL — ABNORMAL HIGH (ref 70–99)
Glucose-Capillary: 260 mg/dL — ABNORMAL HIGH (ref 70–99)

## 2022-08-08 MED ORDER — ACETAMINOPHEN 325 MG PO TABS: 650.0000 mg | ORAL_TABLET | Freq: Four times a day (QID) | ORAL | Status: DC | PRN

## 2022-08-08 MED ORDER — GABAPENTIN 300 MG PO CAPS
300.0000 mg | ORAL_CAPSULE | Freq: Every day | ORAL | Status: DC
  Administered 2022-08-09 – 2022-08-11 (×3): 300 mg via ORAL
  Filled 2022-08-08 (×3): qty 1

## 2022-08-08 MED ORDER — METOPROLOL TARTRATE 25 MG PO TABS
25.0000 mg | ORAL_TABLET | Freq: Two times a day (BID) | ORAL | Status: DC
  Administered 2022-08-08 – 2022-08-11 (×6): 25 mg via ORAL
  Filled 2022-08-08 (×6): qty 1

## 2022-08-08 MED ORDER — ONDANSETRON HCL 4 MG/2ML IJ SOLN: 4.0000 mg | Freq: Four times a day (QID) | INTRAMUSCULAR | Status: DC | PRN

## 2022-08-08 MED ORDER — ONDANSETRON HCL 4 MG PO TABS: 4.0000 mg | ORAL_TABLET | Freq: Four times a day (QID) | ORAL | Status: DC | PRN

## 2022-08-08 MED ORDER — HYDROCODONE-ACETAMINOPHEN 5-325 MG PO TABS
1.0000 | ORAL_TABLET | Freq: Once | ORAL | Status: AC
  Administered 2022-08-08: 1 via ORAL
  Filled 2022-08-08: qty 1

## 2022-08-08 MED ORDER — QUETIAPINE FUMARATE 25 MG PO TABS
25.0000 mg | ORAL_TABLET | Freq: Two times a day (BID) | ORAL | Status: DC
  Administered 2022-08-08 – 2022-08-11 (×6): 25 mg via ORAL
  Filled 2022-08-08 (×6): qty 1

## 2022-08-08 MED ORDER — TRAZODONE HCL 50 MG PO TABS
25.0000 mg | ORAL_TABLET | Freq: Every day | ORAL | Status: DC
  Administered 2022-08-08 – 2022-08-10 (×3): 25 mg via ORAL
  Filled 2022-08-08 (×3): qty 1

## 2022-08-08 MED ORDER — ATORVASTATIN CALCIUM 40 MG PO TABS
40.0000 mg | ORAL_TABLET | Freq: Every day | ORAL | Status: DC
  Administered 2022-08-08 – 2022-08-11 (×4): 40 mg via ORAL
  Filled 2022-08-08 (×4): qty 1

## 2022-08-08 MED ORDER — INSULIN ASPART 100 UNIT/ML IJ SOLN
0.0000 [IU] | Freq: Every day | INTRAMUSCULAR | Status: DC
  Administered 2022-08-08: 2 [IU] via SUBCUTANEOUS
  Administered 2022-08-10: 5 [IU] via SUBCUTANEOUS

## 2022-08-08 MED ORDER — INSULIN ASPART 100 UNIT/ML IJ SOLN
0.0000 [IU] | Freq: Three times a day (TID) | INTRAMUSCULAR | Status: DC
  Administered 2022-08-09: 11 [IU] via SUBCUTANEOUS
  Administered 2022-08-09 – 2022-08-10 (×2): 5 [IU] via SUBCUTANEOUS
  Administered 2022-08-10: 3 [IU] via SUBCUTANEOUS
  Administered 2022-08-10 – 2022-08-11 (×3): 8 [IU] via SUBCUTANEOUS

## 2022-08-08 MED ORDER — POLYETHYLENE GLYCOL 3350 17 G PO PACK: 17.0000 g | PACK | Freq: Every day | ORAL | Status: DC | PRN

## 2022-08-08 MED ORDER — FINASTERIDE 5 MG PO TABS
5.0000 mg | ORAL_TABLET | Freq: Every day | ORAL | Status: DC
  Administered 2022-08-09 – 2022-08-11 (×3): 5 mg via ORAL
  Filled 2022-08-08 (×3): qty 1

## 2022-08-08 MED ORDER — INSULIN ASPART 100 UNIT/ML IJ SOLN
10.0000 [IU] | Freq: Once | INTRAMUSCULAR | Status: AC
  Administered 2022-08-08: 10 [IU] via SUBCUTANEOUS
  Filled 2022-08-08: qty 1

## 2022-08-08 MED ORDER — SODIUM CHLORIDE 0.9 % IV BOLUS
1000.0000 mL | Freq: Once | INTRAVENOUS | Status: AC
  Administered 2022-08-08: 1000 mL via INTRAVENOUS

## 2022-08-08 MED ORDER — SODIUM CHLORIDE 0.9 % IV SOLN: INTRAVENOUS | Status: AC

## 2022-08-08 MED ORDER — LORAZEPAM 0.5 MG PO TABS
0.5000 mg | ORAL_TABLET | Freq: Once | ORAL | Status: AC
  Administered 2022-08-08: 0.5 mg via ORAL
  Filled 2022-08-08: qty 1

## 2022-08-08 MED ORDER — APIXABAN 5 MG PO TABS
5.0000 mg | ORAL_TABLET | Freq: Two times a day (BID) | ORAL | Status: DC
  Administered 2022-08-08 – 2022-08-11 (×6): 5 mg via ORAL
  Filled 2022-08-08 (×6): qty 1

## 2022-08-08 MED ORDER — ACETAMINOPHEN 650 MG RE SUPP: 650.0000 mg | Freq: Four times a day (QID) | RECTAL | Status: DC | PRN

## 2022-08-08 MED ORDER — TAMSULOSIN HCL 0.4 MG PO CAPS
0.8000 mg | ORAL_CAPSULE | Freq: Every day | ORAL | Status: DC
  Administered 2022-08-08 – 2022-08-10 (×3): 0.8 mg via ORAL
  Filled 2022-08-08 (×3): qty 2

## 2022-08-08 NOTE — Assessment & Plan Note (Addendum)
Resume tamsulosin

## 2022-08-08 NOTE — Assessment & Plan Note (Signed)
Likely 2/2 BPH.  Foley drained 1600 mL of urine.  Creatinine stable 1.54. -Foley inserted -Will need placement, group home would not take patient with Foley -Continue tamsulosin 0.8 mg daily and finasteride 5 mg daily -Will need to follow-up with urology if patient is a candidate for more aggressive therapies

## 2022-08-08 NOTE — Progress Notes (Signed)
IV successfully inserted by Valentino Nose, Agricultural consultant. Patient tolerated well.

## 2022-08-08 NOTE — ED Notes (Signed)
Provider signed off on allowing pt to remain without an IV

## 2022-08-08 NOTE — Assessment & Plan Note (Addendum)
Resides in a group home.  Currently oriented to person.  Ambulates without assistance or assistive devices. -Resume Seroquel

## 2022-08-08 NOTE — ED Triage Notes (Signed)
Pt BIB [facility] for hyperglycemia, reports pt is lethargic.  Reported CBG 415 per EMS.  Pt with hx dementia.
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
## 2022-08-08 NOTE — ED Notes (Signed)
Urine sample requested. Pt unable to pee at this time

## 2022-08-08 NOTE — ED Notes (Addendum)
Patient is continuing to refuse IV attempts. Receiving nurse is aware. Receiving nurse accepted report and transfer of patient care without an IV present. MD was notified via secure chat.

## 2022-08-08 NOTE — ED Notes (Signed)
Requested urine sample from pt. Pt unable to urinate at this time

## 2022-08-08 NOTE — Assessment & Plan Note (Signed)
Creatinine 1.54.  About baseline.

## 2022-08-08 NOTE — Assessment & Plan Note (Addendum)
Resume Eliquis 

## 2022-08-08 NOTE — ED Notes (Signed)
After 2 nurses failed at 2 IV attempts the patient refused anymore attempts

## 2022-08-08 NOTE — ED Provider Notes (Signed)
[facility] Provider Note   CSN: [id number] Arrival date & time: 08/08/22  1235     History  Chief Complaint  Patient presents with   Hyperglycemia    Mitchell Price is a 77 y.o. male.  Patient presents the emergency department via EMS from a group home due to hyperglycemia with concerns about lethargy.  Patient with history of dementia and is reportedly alert and oriented at baseline at this time.  Group home facility states he has been less active than usual.  The patient was hospitalized from January 10 to February 15 of this year.  He was admitted secondary to dehydration in setting of diarrhea and with his admission complicated by urinary retention status post failed voiding trial.  The Foley was replaced on February 14 with plans for repeated voiding challenge 1 to 2 weeks after discharge.  On February 21 the patient was able to void and was able to be discharged back to his original group home on February 22.  Patient currently voices no complaints.  He denies the need to urinate at this time.  He denies abdominal pain, nausea, vomiting, chest pain, shortness of breath, dysuria.  Past medical history significant for GI bleeds, stage IIIa chronic kidney disease, type II DM, dementia, urinary retention  HPI     Home Medications Prior to Admission medications   Medication Sig Start Date End Date Taking? Authorizing Provider  acetaminophen (TYLENOL) 500 MG tablet Take 1 tablet (500 mg total) by mouth every 6 (six) hours as needed for mild pain. 07/22/22   Val Riles, MD  apixaban (ELIQUIS) 5 MG TABS tablet Take 1 tablet (5 mg total) by mouth 2 (two) times daily. TAKE 1 TABLET(5 MG) BY MOUTH TWICE DAILY 07/22/22 08/21/22  Val Riles, MD  atorvastatin (LIPITOR) 40 MG tablet Take 1 tablet (40 mg total) by mouth daily. 07/22/22 08/21/22  Val Riles, MD  docusate sodium (COLACE) 100 MG capsule Take 1 capsule (100 mg total) by mouth 2 (two)  times daily as needed for mild constipation. 07/22/22 08/21/22  Val Riles, MD  empagliflozin (JARDIANCE) 25 MG TABS tablet Take 1 tablet (25 mg total) by mouth daily. 07/22/22 08/21/22  Val Riles, MD  ferrous sulfate 325 (65 FE) MG EC tablet Take 1 tablet (325 mg total) by mouth daily with breakfast. 07/22/22 08/21/22  Val Riles, MD  finasteride (PROSCAR) 5 MG tablet Take 1 tablet (5 mg total) by mouth daily. 07/31/22   Lorella Nimrod, MD  gabapentin (NEURONTIN) 300 MG capsule Take 1 capsule (300 mg total) by mouth daily. 07/22/22 08/21/22  Val Riles, MD  glimepiride (AMARYL) 4 MG tablet Take 1 tablet (4 mg total) by mouth every morning. 07/22/22 08/21/22  Val Riles, MD  melatonin 5 MG TABS Take 1 tablet (5 mg total) by mouth at bedtime. 07/22/22 08/21/22  Val Riles, MD  metFORMIN (GLUCOPHAGE) 1000 MG tablet Take 1 tablet (1,000 mg total) by mouth 2 (two) times daily. 07/22/22 08/21/22  Val Riles, MD  metoprolol tartrate (LOPRESSOR) 25 MG tablet Take 1 tablet (25 mg total) by mouth 2 (two) times daily. 07/22/22 08/21/22  Val Riles, MD  pioglitazone (ACTOS) 45 MG tablet Take 1 tablet (45 mg total) by mouth daily. 07/22/22 08/21/22  Val Riles, MD  QUEtiapine (SEROQUEL) 25 MG tablet Take 1 tablet (25 mg total) by mouth 2 (two) times daily. 07/22/22 08/21/22  Val Riles, MD  tamsulosin (FLOMAX) 0.4 MG CAPS capsule Take 2 capsules (  0.8 mg total) by mouth daily after supper. 07/30/22   Lorella Nimrod, MD  traZODone (DESYREL) 50 MG tablet Take 0.5 tablets (25 mg total) by mouth at bedtime. 07/22/22 08/21/22  Val Riles, MD      Allergies    Patient has no known allergies.    Review of Systems   Review of Systems  Constitutional:  Negative for fever.  Respiratory:  Negative for shortness of breath.   Cardiovascular:  Negative for chest pain.  Gastrointestinal:  Negative for abdominal pain, nausea and vomiting.  Genitourinary:  Positive for decreased urine volume.  Neurological:   Negative for weakness.    Physical Exam Updated Vital Signs BP 108/63   Pulse 61   Temp 97.8 F (36.6 C) (Oral)   SpO2 98%  Physical Exam Vitals and nursing note reviewed.  Constitutional:      General: He is not in acute distress.    Appearance: He is well-developed.  HENT:     Head: Normocephalic and atraumatic.  Eyes:     Conjunctiva/sclera: Conjunctivae normal.  Cardiovascular:     Rate and Rhythm: Normal rate and regular rhythm.     Heart sounds: No murmur heard. Pulmonary:     Effort: Pulmonary effort is normal. No respiratory distress.     Breath sounds: Normal breath sounds.  Abdominal:     Palpations: Abdomen is soft.     Tenderness: There is no abdominal tenderness.  Musculoskeletal:        General: No swelling.     Cervical back: Neck supple.  Skin:    General: Skin is warm and dry.     Capillary Refill: Capillary refill takes less than 2 seconds.  Neurological:     Mental Status: He is alert. Mental status is at baseline.  Psychiatric:        Mood and Affect: Mood normal.     ED Results / Procedures / Treatments   Labs (all labs ordered are listed, but only abnormal results are displayed) Labs Reviewed  BASIC METABOLIC PANEL - Abnormal; Notable for the following components:      Result Value   CO2 19 (*)    Glucose, Bld 440 (*)    BUN 25 (*)    Creatinine, Ser 1.54 (*)    GFR, Estimated 46 (*)    All other components within normal limits  CBC - Abnormal; Notable for the following components:   RBC 3.81 (*)    Hemoglobin 12.5 (*)    HCT 38.3 (*)    MCV 100.5 (*)    All other components within normal limits  URINALYSIS, ROUTINE W REFLEX MICROSCOPIC - Abnormal; Notable for the following components:   Color, Urine STRAW (*)    Glucose, UA >=500 (*)    All other components within normal limits  CBG MONITORING, ED - Abnormal; Notable for the following components:   Glucose-Capillary 457 (*)    All other components within normal limits  CBG  MONITORING, ED - Abnormal; Notable for the following components:   Glucose-Capillary 321 (*)    All other components within normal limits    EKG None  Radiology No results found.  Procedures Procedures    Medications Ordered in ED Medications  insulin aspart (novoLOG) injection 10 Units (10 Units Subcutaneous Given 08/08/22 1416)  LORazepam (ATIVAN) tablet 0.5 mg (0.5 mg Oral Given 08/08/22 1537)  HYDROcodone-acetaminophen (NORCO/VICODIN) 5-325 MG per tablet 1 tablet (1 tablet Oral Given 08/08/22 1537)    ED Course/ Medical  Decision Making/ A&P                             Medical Decision Making Amount and/or Complexity of Data Reviewed Labs: ordered.  Risk Prescription drug management.   This patient presents to the ED for concern of subjective increased lethargy in the setting of hyperglycemia, urinary retention, this involves an extensive number of treatment options, and is a complaint that carries with it a high risk of complications and morbidity.  The differential diagnosis includes urinary retention, UTI, electrolyte abnormalities, DKA, HHS, others   Co morbidities that complicate the patient evaluation  Recent admission with complication of urinary retention   Additional history obtained:  Additional history obtained from EMS External records from outside source obtained and reviewed including discharge summary from February 21   Lab Tests:  I Ordered, and personally interpreted labs.  The pertinent results include: CBC 321, creatinine 1.54, UA with 500+ glucose but otherwise unremarkable, CBC with hemoglobin 12.5, no leukocytosis noted   Cardiac Monitoring: / EKG:  The patient was maintained on a cardiac monitor.  I personally viewed and interpreted the cardiac monitored which showed an underlying rhythm of: Sinus rhythm   Consultations Obtained:  I requested consultation with the hospitalist,  and discussed lab and imaging findings as well as pertinent  plan - they recommend: admission   Problem List / ED Course / Critical interventions / Medication management   I ordered medication including insulin for hyperglycemia, hydrocodone and Ativan for agitation/anxiety and pain related to Foley catheter placement Reevaluation of the patient after these medicines showed that the patient improved I have reviewed the patients home medicines and have made adjustments as needed   Social Determinants of Health:  Patient lives in a group home which is unable to accept him back to the emergency department with a Foley catheter in place.   Test / Admission - Considered:  The patient does not appear to be in DKA/HHS. Patient is hyperglycemic with history of the same. Patient's CBG decreased after 10 units Novolog.  Patient was unable to provide urine sample. Patient's bladder scan shows 1179m urine. I ordered a foley catheter. Over 16059min urine return noted. Planned to discharge patient with foley catheter and plans for outpatient urology follow up. Unfortunately patient's group home in unable to accept/care for patient with foley in place. Patient will need SNF placement.          Final Clinical Impression(s) / ED Diagnoses Final diagnoses:  Hyperglycemia  Urinary retention    Rx / DC Orders ED Discharge Orders     None         McRonny Bacon3/02/24 1702    ZaFredia SorrowMD 08/09/22 [phone]

## 2022-08-08 NOTE — Assessment & Plan Note (Signed)
Hemoglobin stable.

## 2022-08-08 NOTE — Assessment & Plan Note (Addendum)
Blood glucose 440.  Normal anion gap 11, serum bicarb slightly low at 19.   -CBG improved with 10 units of insulin - Uncontrolled diabetes A1c 9. -On multiple oral agents, glimepiride, Actos, empagliflozin, metformin  - SSI- M -1 L bolus, continue N/s 100cc/hr x 15hrs -Resume gabapentin

## 2022-08-09 DIAGNOSIS — R339 Retention of urine, unspecified: Secondary | ICD-10-CM | POA: Diagnosis not present

## 2022-08-09 DIAGNOSIS — N401 Enlarged prostate with lower urinary tract symptoms: Secondary | ICD-10-CM | POA: Diagnosis not present

## 2022-08-09 DIAGNOSIS — K279 Peptic ulcer, site unspecified, unspecified as acute or chronic, without hemorrhage or perforation: Secondary | ICD-10-CM | POA: Diagnosis not present

## 2022-08-09 DIAGNOSIS — E1165 Type 2 diabetes mellitus with hyperglycemia: Secondary | ICD-10-CM | POA: Diagnosis not present

## 2022-08-09 DIAGNOSIS — R338 Other retention of urine: Secondary | ICD-10-CM

## 2022-08-09 LAB — BASIC METABOLIC PANEL
Anion gap: 7 (ref 5–15)
BUN: 22 mg/dL (ref 8–23)
CO2: 21 mmol/L — ABNORMAL LOW (ref 22–32)
Calcium: 8.6 mg/dL — ABNORMAL LOW (ref 8.9–10.3)
Chloride: 110 mmol/L (ref 98–111)
Creatinine, Ser: 1.26 mg/dL — ABNORMAL HIGH (ref 0.61–1.24)
GFR, Estimated: 58 mL/min — ABNORMAL LOW (ref >60)
Glucose, Bld: 232 mg/dL — ABNORMAL HIGH (ref 70–99)
Potassium: 3.9 mmol/L (ref 3.5–5.1)
Sodium: 138 mmol/L (ref 135–145)

## 2022-08-09 LAB — GLUCOSE, CAPILLARY
Glucose-Capillary: 213 mg/dL — ABNORMAL HIGH (ref 70–99)
Glucose-Capillary: 326 mg/dL — ABNORMAL HIGH (ref 70–99)
Glucose-Capillary: 72 mg/dL (ref 70–99)
Glucose-Capillary: 189 mg/dL — ABNORMAL HIGH (ref 70–99)

## 2022-08-09 MED ORDER — CHLORHEXIDINE GLUCONATE CLOTH 2 % EX PADS
6.0000 | MEDICATED_PAD | Freq: Every day | CUTANEOUS | Status: DC
  Administered 2022-08-09 – 2022-08-10 (×2): 6 via TOPICAL

## 2022-08-09 NOTE — Care Management Obs Status (Signed)
[facility] NOTIFICATION   Patient Details  Name: Mitchell Price MRN: [id number] Date of Birth: 04/23/1946   Medicare Observation Status Notification Given:  Yes  [place], LCSW-A 08/09/2022, 1:21 PM

## 2022-08-09 NOTE — Assessment & Plan Note (Signed)
-  Outpatient follow-up with urology service recommended -Foley catheter in place due to ongoing symptoms of urinary retention -Continue Proscar and Flomax.

## 2022-08-09 NOTE — Progress Notes (Addendum)
PT Cancellation Note  Patient Details Name: Mitchell Price MRN: [id number] DOB: 26-Jun-1945   Cancelled Treatment:    Reason Eval/Treat Not Completed: Patient declined, no reason specified  Upon PT entry, pt woken up and pleasant with initial introduction and education of PT services. Pt politely and jokingly declining PT services, "I'm not getting out of this bed, I don't have any problems." PT initiated PLOF questioning, pt A&O x 1 to person, able to say place is hospital but location unable to provide. Pt providing poor historical/PLOF facts based upon chart review before attempted evaluation. Per chart review, pt with increasing level of dementia, has been in LTC since 2023. Attempted to persuade pt to mobilize outside hallway for new scenery and pt politely declining, saying "I'm not going anywhere." When PT asked pt why, pt replied, "because I don't need anything, I move great." When asked about ADL capacity, pt reported being independent having no concerns, pt even reported, "I could jump up and give you jumping jacks, but I don't want to."After third and final attempt to mobilize pt with requests for coffee, pt continued to decline, at this point, PT terminated evaluation attempt.   PT will try once more before discharge for functional evaluation as schedule permits. Based upon chart review, pt is at functional baseline for mobility and ambulation with mild balance deficits but nothing of major limitations. Potential to DC without skilled PT services needed, but will attempt PT evaluation at later date if pt is agreeable, if not agreeable PT will discharge from PT caseload.    Wonda Olds 08/09/2022, 10:03 AM

## 2022-08-09 NOTE — NC FL2 (Signed)
[place] LEVEL OF CARE FORM     IDENTIFICATION  Patient Name: Mitchell Price Birthdate: 02-24-1946 Sex: male Admission Date (Current Location): 08/08/2022  [facility] [place] Number:  [employer] and Address:  [place] [address] [place]      Provider Number: [id number]  Attending Physician Name and Address:  Barton Dubois, MD  Relative Name and Phone Number:  Felix Ahmadi) [phone]    Current Level of Care: Hospital (observation) Recommended Level of Care: [place], Memory Care Prior Approval Number:    Date Approved/Denied:   PASRR Number: [id number]  Discharge Plan: SNF (memory care)    Current Diagnoses: Patient Active Problem List   Diagnosis Date Noted   Urinary retention 07/23/2022   Goals of care, counseling/discussion 07/23/2022   Altered mental status 07/09/2022   Fall 07/09/2022   Hyperglycemia 07/09/2022   Diarrhea of infectious origin 07/09/2022   Intractable diarrhea 07/08/2022   Uncontrolled type 2 diabetes mellitus with hyperglycemia, with long-term current use of insulin ([place]) 07/08/2022   Diabetic neuropathy ([place]) 07/08/2022   Dyslipidemia 07/08/2022   History of DVT (deep vein thrombosis) 07/08/2022   Essential hypertension 07/08/2022   Dementia without behavioral disturbance ([place]) 06/19/2022   Stage 3a chronic kidney disease (CKD) ([place]) 09/12/2019   Melena 09/12/2019   Acute blood loss anemia 09/12/2019   History of hemorrhoids 09/12/2019   Type 2 diabetes mellitus with stage 3 chronic kidney disease ([place]) 09/12/2019   PUD (peptic ulcer disease) 09/12/2019   HTN (hypertension) 09/12/2019   Upper GI bleed 09/12/2019   Hyperglycemia due to type 2 diabetes mellitus ([place]) 09/12/2019   BPH (benign prostatic hyperplasia) 01/17/2019   HLD (hyperlipidemia) 01/17/2019   Multiple thyroid nodules 07/18/2017   Grade III hemorrhoids    Acute GI bleeding  07/13/2017   GIB (gastrointestinal bleeding) 07/12/2017   Acidosis, hyperchloremic 12/13/2015   Erectile dysfunction due to arterial insufficiency 04/13/2014   Diverticulosis 08/25/2013   Internal hemorrhoid, bleeding 08/25/2013   Diabetes type 2, uncontrolled 08/22/2013   Renal mass 08/21/2013   Lens replaced by other means 01/26/2013   Status post cataract extraction 01/26/2013   Anemia 04/01/2012    Orientation RESPIRATION BLADDER Height & Weight     Self, Situation  Normal Indwelling catheter Weight:   Height:     BEHAVIORAL SYMPTOMS/MOOD NEUROLOGICAL BOWEL NUTRITION STATUS  [place]   Continent Diet (heart healthy/carb modified)  AMBULATORY STATUS COMMUNICATION OF NEEDS Skin   Supervision Verbally Normal                       Personal Care Assistance Level of Assistance  Bathing, Feeding, Dressing Bathing Assistance: Limited assistance Feeding assistance: Limited assistance Dressing Assistance: Limited assistance     Functional Limitations Info  Sight, Hearing, Speech Sight Info: Adequate Hearing Info: Adequate Speech Info: Adequate    SPECIAL CARE FACTORS FREQUENCY                       Contractures Contractures Info: Not present    Additional Factors Info  Code Status, Allergies Code Status Info: Full Allergies Info: NKA           Current Medications (08/09/2022):  This is the current hospital active medication list Current Facility-Administered Medications  Medication Dose Route Frequency Provider Last Rate Last Admin   acetaminophen (TYLENOL) tablet 650 mg  650 mg Oral Q6H PRN Emokpae, Leanne Chang, MD  Or   acetaminophen (TYLENOL) suppository 650 mg  650 mg Rectal Q6H PRN Emokpae, Ejiroghene E, MD       apixaban (ELIQUIS) tablet 5 mg  5 mg Oral BID Emokpae, Ejiroghene E, MD   5 mg at 08/09/22 0838   atorvastatin (LIPITOR) tablet 40 mg  40 mg Oral Daily Emokpae, Ejiroghene E, MD   40 mg at 08/09/22 [id number]   Chlorhexidine Gluconate  Cloth 2 % PADS 6 each  6 each Topical Daily Barton Dubois, MD   6 each at 08/09/22 0842   finasteride (PROSCAR) tablet 5 mg  5 mg Oral Daily Emokpae, Ejiroghene E, MD   5 mg at 08/09/22 0838   gabapentin (NEURONTIN) capsule 300 mg  300 mg Oral Daily Emokpae, Ejiroghene E, MD   300 mg at 08/09/22 0838   insulin aspart (novoLOG) injection 0-15 Units  0-15 Units Subcutaneous TID WC Emokpae, Ejiroghene E, MD   11 Units at 08/09/22 1218   insulin aspart (novoLOG) injection 0-5 Units  0-5 Units Subcutaneous QHS Emokpae, Ejiroghene E, MD   2 Units at 08/08/22 2116   metoprolol tartrate (LOPRESSOR) tablet 25 mg  25 mg Oral BID Emokpae, Ejiroghene E, MD   25 mg at 08/09/22 0839   ondansetron (ZOFRAN) tablet 4 mg  4 mg Oral Q6H PRN Emokpae, Ejiroghene E, MD       Or   ondansetron (ZOFRAN) injection 4 mg  4 mg Intravenous Q6H PRN Emokpae, Ejiroghene E, MD       polyethylene glycol (MIRALAX / GLYCOLAX) packet 17 g  17 g Oral Daily PRN Emokpae, Ejiroghene E, MD       QUEtiapine (SEROQUEL) tablet 25 mg  25 mg Oral BID Emokpae, Ejiroghene E, MD   25 mg at 08/09/22 0838   tamsulosin (FLOMAX) capsule 0.8 mg  0.8 mg Oral QPC supper Emokpae, Ejiroghene E, MD   0.8 mg at 08/08/22 2116   traZODone (DESYREL) tablet 25 mg  25 mg Oral QHS Emokpae, Ejiroghene E, MD   25 mg at 08/08/22 2116     Discharge Medications: Please see discharge summary for a list of discharge medications.  Relevant Imaging Results:  Relevant Lab Results:   Additional Information SSN: [id number]. Special assistance application has been completed by the son with [facility].  Adaia Matthies, Clydene Pugh, LCSW

## 2022-08-09 NOTE — Progress Notes (Signed)
Patient attempted to get out of bed unassisted. Bed alarm engaged. Attempted to reorient patient to time and place. Not receptive to reorientation and using profanity. Patient calmed down after a few minutes and laid back down in bed. Plan of care ongoing.

## 2022-08-09 NOTE — Plan of Care (Signed)
  Problem: Education: Goal: Knowledge of General Education information will improve Description Including pain rating scale, medication(s)/side effects and non-pharmacologic comfort measures Outcome: Progressing   Problem: Health Behavior/Discharge Planning: Goal: Ability to manage health-related needs will improve Outcome: Progressing   

## 2022-08-09 NOTE — TOC Initial Note (Addendum)
Transition of Care [facility]) - Initial/Assessment Note    Patient Details  Name: Mitchell Price MRN: [id number] Date of Birth: [birth year]  [facility] CM/SW Contact:    Ihor Gully, LCSW Phone Number: 08/09/2022, 3:47 PM  Clinical Narrative:                 Patient from [facility] facility. They cannot manage a cath. Spoke with patient's son who understands that patient will have to go to a higher level of care as patient will have cath until his urology appointment . PT eval order requested from attending.   Expected Discharge Plan: Skilled Nursing Facility Barriers to Discharge: Continued Medical Work up   Patient Goals and CMS Choice            Expected Discharge Plan and Services       Living arrangements for the past 2 months: Group Home                                      Prior Living Arrangements/Services Living arrangements for the past 2 months: Group Home                     Activities of Daily Living   ADL Screening (condition at time of admission) Patient's cognitive ability adequate to safely complete daily activities?: Yes Is the patient deaf or have difficulty hearing?: No Does the patient have difficulty seeing, even when wearing glasses/contacts?: No Does the patient have difficulty concentrating, remembering, or making decisions?: Yes Patient able to express need for assistance with ADLs?: Yes Does the patient have difficulty dressing or bathing?: Yes Independently performs ADLs?: No Communication: Independent Dressing (OT): Independent Grooming: Needs assistance Is this a change from baseline?: Pre-admission baseline Feeding: Independent Bathing: Needs assistance Is this a change from baseline?: Pre-admission baseline Toileting: Independent Is this a change from baseline?: Pre-admission baseline In/Out Bed: Needs assistance Is this a change from baseline?: Pre-admission baseline Walks in  Home: Independent Is this a change from baseline?: Pre-admission baseline Does the patient have difficulty walking or climbing stairs?: Yes Weakness of Legs: Both Weakness of Arms/Hands: None  Permission Sought/Granted Permission sought to share information with : [employer], Customer service manager    Share Information with NAME: [name], son; Jewel Baize, facility administrator           Emotional Assessment              Admission diagnosis:  Urinary retention [R33.9] Hyperglycemia [R73.9] Patient Active Problem List   Diagnosis Date Noted   Urinary retention 07/23/2022   Goals of care, counseling/discussion 07/23/2022   Altered mental status 07/09/2022   Fall 07/09/2022   Hyperglycemia 07/09/2022   Diarrhea of infectious origin 07/09/2022   Intractable diarrhea 07/08/2022   Uncontrolled type 2 diabetes mellitus with hyperglycemia, with long-term current use of insulin ([place]) 07/08/2022   Diabetic neuropathy ([place]) 07/08/2022   Dyslipidemia 07/08/2022   History of DVT (deep vein thrombosis) 07/08/2022   Essential hypertension 07/08/2022   Dementia without behavioral disturbance ([place]) 06/19/2022   Stage 3a chronic kidney disease (CKD) ([place]) 09/12/2019   Melena 09/12/2019   Acute blood loss anemia 09/12/2019   History of hemorrhoids 09/12/2019   Type 2 diabetes mellitus with stage 3 chronic kidney disease ([place]) 09/12/2019   PUD (peptic ulcer disease) 09/12/2019   HTN (hypertension) 09/12/2019  Upper GI bleed 09/12/2019   Hyperglycemia due to type 2 diabetes mellitus ([place]) 09/12/2019   BPH (benign prostatic hyperplasia) 01/17/2019   HLD (hyperlipidemia) 01/17/2019   Multiple thyroid nodules 07/18/2017   Grade III hemorrhoids    Acute GI bleeding 07/13/2017   GIB (gastrointestinal bleeding) 07/12/2017   Acidosis, hyperchloremic 12/13/2015   Erectile dysfunction due to arterial insufficiency 04/13/2014   Diverticulosis 08/25/2013   Internal  hemorrhoid, bleeding 08/25/2013   Diabetes type 2, uncontrolled 08/22/2013   Renal mass 08/21/2013   Lens replaced by other means 01/26/2013   Status post cataract extraction 01/26/2013   Anemia 04/01/2012   PCP:  Doretha Imus, PA-C Pharmacy:   [facility] [place] [address] [place] [address] Phone: [phone] Fax: [place], [place] - [place] [facility] [address] [place] [address] Phone: [phone] Fax: [phone]     Social Determinants of Health ([place]) Social History: SDOH Screenings   Food Insecurity: Unknown (07/23/2022)  Transportation Needs: Unknown (07/23/2022)  Utilities: Unknown (07/23/2022)  Tobacco Use: High Risk (08/08/2022)   SDOH Interventions:     Readmission Risk Interventions     No data to display

## 2022-08-09 NOTE — Progress Notes (Signed)
Progress Note   Patient: Mitchell Price DOB: 1945-09-12 DOA: 08/08/2022     0 DOS: the patient was seen and examined on 08/09/2022   Brief hospital course: As per H&P written by Dr. Denton Brick on 08/08/2022 Mitchell Price is a 77 y.o. male with medical history significant for dementia, hypertension, DVT history, diabetes mellitus, BPH. Patient was brought to the ED from care home with reports of high blood sugar, blood sugar was in the 400s.  Also reported lethargy.  On my evaluation, patient is initially sleeping, but arouses, fully awake alert and oriented to person and situation to some extent, able to answer questions.  He denies pain.  No difficulty breathing.  No chest pain.   Recent hospitalizations 1/10 to 2/15 -AKI secondary to dehydration in setting of diarrhea.Hospital stay was complicated by urinary retention, s/p failed voiding challenge.  Hence- Foley was replaced on 07/22/2022 with plans for repeating voiding challenge in 1 to 2 weeks.    Unfortunately group home sent patient back to the ED, because of the Foley, so he was hospitalized again 2/15 to 2/22-for urinary retention, he passed his voiding trial, so he was able to go back to his group home.   ED Course:  Stable vitals.  Blood glucose 440.  Anion gap of 11, serum bicarb of 19.  Bedside bladder scan revealed thousand 100 mL of urine, Foley catheter inserted drained 1600 mL of urine.  Foley was inserted, but again group home would not take patient's with Foley catheter hence hospitalization was requested.  Assessment and Plan: * Urinary retention -Likely 2/2 BPH.  Foley drained 1600 mL of urine.   -Creatinine stable 1.54. -Foley inserted; with plans to follow-up as an outpatient with urology service. -Patient's group home would not take patient with Foley catheter. -Continue tamsulosin 0.8 mg daily and finasteride 5 mg daily -No signs of acute infection currently.  Stage 3a chronic kidney disease (CKD)  (HCC) -Creatinine 1.54.   -Stable and at baseline currently. -Continue minimizing nephrotoxic agents and follow renal function trend.  History of DVT (deep vein thrombosis) -Continue Eliquis  Essential hypertension -Stable and well-controlled -Follow-up vital signs. -Continue current antihypertensive agents.  Dementia without behavioral disturbance ([place]) -Resides in a group home.   -Currently oriented to person only.   -At baseline ambulates without assistance or assistive devices; patient requires assistance and supervision for his ADLs due to underlying dementia. -Continue Seroquel  BPH (benign prostatic hyperplasia) -Outpatient follow-up with urology service recommended -Foley catheter in place due to ongoing symptoms of urinary retention -Continue Proscar and Flomax.  Hyperglycemia due to type 2 diabetes mellitus ([place]) -Blood glucose 440.  Normal anion gap 11, serum bicarb slightly low at 19.   -CBG improved with 10 units of insulin - Uncontrolled diabetes appreciated with most recent A1c 9. -On multiple oral agents as an outpatient (glimepiride, Actos, empagliflozin, metformin) -Continue sliding scale insulin and long-acting -Holding oral agents while inpatient. -Maintain adequate hydration.   PUD (peptic ulcer disease) -No nausea, no vomiting, no abdominal pain -No signs of overt bleeding -Continue PPI.   Subjective:  No fever, no chest pain, no nausea or vomiting.  Patient in no acute distress.  Denying dysuria and without hematuria.  Foley catheter in place.  Physical Exam: Vitals:   08/08/22 2257 08/09/22 0300 08/09/22 0626 08/09/22 1351  BP: 120/67 128/70 111/72 (!) 99/58  Pulse: 71 73 72 77  Resp: '20 17 18 16  '$ Temp: 97.6 F (36.4 C) 98.3 F (  36.8 C) 97.9 F (36.6 C) 98.3 F (36.8 C)  TempSrc: Oral Oral  Oral  SpO2: 98% 96% 96% 99%   General exam: Alert, awake, oriented x 1; following commands appropriately and expressing no acute complaints.    Respiratory system: Clear to auscultation. Respiratory effort normal.  Good saturation on room air. Cardiovascular system:RRR. No rubs or gallops. Gastrointestinal system: Abdomen is nondistended, soft and nontender. No organomegaly or masses felt. Normal bowel sounds heard. GU: Foley catheter in place; no hematuria.  Patient denies suprapubic discomfort. Central nervous system: No focal neurological deficits. Extremities: No cyanosis or clubbing. Skin: No petechiae. Psychiatry:Mood & affect appropriate.   Data Reviewed:   Family Communication: No family at bedside.  Disposition: Status is: Observation The patient remains OBS appropriate and will d/c before 2 midnights.   Planned Discharge Destination: Skilled nursing facility  Time spent: 35 minutes  Author: Barton Dubois, MD 08/09/2022 4:12 PM  For on call review [email or web address].

## 2022-08-09 NOTE — NC FL2 (Deleted)
[place] LEVEL OF CARE FORM     IDENTIFICATION  Patient Name: Mitchell Price Birthdate: 1945-11-02 Sex: male Admission Date (Current Location): 08/08/2022  [facility] and [place] Number:  [employer] and Address:  [facility] [address] [place]      Provider Number: [id number]  Attending Physician Name and Address:  Barton Dubois, MD  Relative Name and Phone Number:  [name] [phone]    Current Level of Care: Hospital (observation) Recommended Level of Care: [place] Prior Approval Number:    Date Approved/Denied:   PASRR Number: [id number]  Discharge Plan: SNF    Current Diagnoses: Patient Active Problem List   Diagnosis Date Noted   Urinary retention 07/23/2022   Goals of care, counseling/discussion 07/23/2022   Altered mental status 07/09/2022   Fall 07/09/2022   Hyperglycemia 07/09/2022   Diarrhea of infectious origin 07/09/2022   Intractable diarrhea 07/08/2022   Uncontrolled type 2 diabetes mellitus with hyperglycemia, with long-term current use of insulin ([place]) 07/08/2022   Diabetic neuropathy ([place]) 07/08/2022   Dyslipidemia 07/08/2022   History of DVT (deep vein thrombosis) 07/08/2022   Essential hypertension 07/08/2022   Dementia without behavioral disturbance ([place]) 06/19/2022   Stage 3a chronic kidney disease (CKD) ([place]) 09/12/2019   Melena 09/12/2019   Acute blood loss anemia 09/12/2019   History of hemorrhoids 09/12/2019   Type 2 diabetes mellitus with stage 3 chronic kidney disease ([place]) 09/12/2019   PUD (peptic ulcer disease) 09/12/2019   HTN (hypertension) 09/12/2019   Upper GI bleed 09/12/2019   Hyperglycemia due to type 2 diabetes mellitus ([place]) 09/12/2019   BPH (benign prostatic hyperplasia) 01/17/2019   HLD (hyperlipidemia) 01/17/2019   Multiple thyroid nodules 07/18/2017   Grade III hemorrhoids    Acute GI bleeding 07/13/2017   GIB (gastrointestinal  bleeding) 07/12/2017   Acidosis, hyperchloremic 12/13/2015   Erectile dysfunction due to arterial insufficiency 04/13/2014   Diverticulosis 08/25/2013   Internal hemorrhoid, bleeding 08/25/2013   Diabetes type 2, uncontrolled 08/22/2013   Renal mass 08/21/2013   Lens replaced by other means 01/26/2013   Status post cataract extraction 01/26/2013   Anemia 04/01/2012    Orientation RESPIRATION BLADDER Height & Weight     Self, Situation  Normal Indwelling catheter Weight:   Height:     BEHAVIORAL SYMPTOMS/MOOD NEUROLOGICAL BOWEL NUTRITION STATUS  Wanderer   Continent Diet (heart healthy/carb modified)  AMBULATORY STATUS COMMUNICATION OF NEEDS Skin   Supervision Verbally Normal                       Personal Care Assistance Level of Assistance  Bathing, Feeding, Dressing Bathing Assistance: Limited assistance Feeding assistance: Limited assistance Dressing Assistance: Limited assistance     Functional Limitations Info  Sight, Hearing, Speech Sight Info: Adequate Hearing Info: Adequate Speech Info: Adequate    SPECIAL CARE FACTORS FREQUENCY                       Contractures Contractures Info: Not present    Additional Factors Info  Code Status, Allergies Code Status Info: Full Allergies Info: NKA           Current Medications (08/09/2022):  This is the current hospital active medication list Current Facility-Administered Medications  Medication Dose Route Frequency Provider Last Rate Last Admin   acetaminophen (TYLENOL) tablet 650 mg  650 mg Oral Q6H PRN Emokpae, Leanne Chang, MD  Or   acetaminophen (TYLENOL) suppository 650 mg  650 mg Rectal Q6H PRN Emokpae, Ejiroghene E, MD       apixaban (ELIQUIS) tablet 5 mg  5 mg Oral BID Emokpae, Ejiroghene E, MD   5 mg at 08/09/22 0838   atorvastatin (LIPITOR) tablet 40 mg  40 mg Oral Daily Emokpae, Ejiroghene E, MD   40 mg at 08/09/22 [id number]   Chlorhexidine Gluconate Cloth 2 % PADS 6 each  6 each Topical  Daily Barton Dubois, MD   6 each at 08/09/22 0842   finasteride (PROSCAR) tablet 5 mg  5 mg Oral Daily Emokpae, Ejiroghene E, MD   5 mg at 08/09/22 0838   gabapentin (NEURONTIN) capsule 300 mg  300 mg Oral Daily Emokpae, Ejiroghene E, MD   300 mg at 08/09/22 0838   insulin aspart (novoLOG) injection 0-15 Units  0-15 Units Subcutaneous TID WC Emokpae, Ejiroghene E, MD   11 Units at 08/09/22 1218   insulin aspart (novoLOG) injection 0-5 Units  0-5 Units Subcutaneous QHS Emokpae, Ejiroghene E, MD   2 Units at 08/08/22 2116   metoprolol tartrate (LOPRESSOR) tablet 25 mg  25 mg Oral BID Emokpae, Ejiroghene E, MD   25 mg at 08/09/22 0839   ondansetron (ZOFRAN) tablet 4 mg  4 mg Oral Q6H PRN Emokpae, Ejiroghene E, MD       Or   ondansetron (ZOFRAN) injection 4 mg  4 mg Intravenous Q6H PRN Emokpae, Ejiroghene E, MD       polyethylene glycol (MIRALAX / GLYCOLAX) packet 17 g  17 g Oral Daily PRN Emokpae, Ejiroghene E, MD       QUEtiapine (SEROQUEL) tablet 25 mg  25 mg Oral BID Emokpae, Ejiroghene E, MD   25 mg at 08/09/22 0838   tamsulosin (FLOMAX) capsule 0.8 mg  0.8 mg Oral QPC supper Emokpae, Ejiroghene E, MD   0.8 mg at 08/08/22 2116   traZODone (DESYREL) tablet 25 mg  25 mg Oral QHS Emokpae, Ejiroghene E, MD   25 mg at 08/08/22 2116     Discharge Medications: Please see discharge summary for a list of discharge medications.  Relevant Imaging Results:  Relevant Lab Results:   Additional Information SSN: [id number]. Special assistance application has been completed by the son with [facility].  Breauna Mazzeo, Clydene Pugh, LCSW

## 2022-08-10 DIAGNOSIS — I10 Essential (primary) hypertension: Secondary | ICD-10-CM | POA: Diagnosis not present

## 2022-08-10 DIAGNOSIS — R339 Retention of urine, unspecified: Secondary | ICD-10-CM | POA: Diagnosis not present

## 2022-08-10 DIAGNOSIS — F0390 Unspecified dementia without behavioral disturbance: Secondary | ICD-10-CM | POA: Diagnosis not present

## 2022-08-10 DIAGNOSIS — E1165 Type 2 diabetes mellitus with hyperglycemia: Secondary | ICD-10-CM | POA: Diagnosis not present

## 2022-08-10 LAB — GLUCOSE, CAPILLARY
Glucose-Capillary: 155 mg/dL — ABNORMAL HIGH (ref 70–99)
Glucose-Capillary: 217 mg/dL — ABNORMAL HIGH (ref 70–99)
Glucose-Capillary: 265 mg/dL — ABNORMAL HIGH (ref 70–99)
Glucose-Capillary: 365 mg/dL — ABNORMAL HIGH (ref 70–99)

## 2022-08-10 MED ORDER — INSULIN GLARGINE-YFGN 100 UNIT/ML ~~LOC~~ SOLN
12.0000 [IU] | Freq: Every day | SUBCUTANEOUS | Status: DC
  Administered 2022-08-10: 12 [IU] via SUBCUTANEOUS
  Filled 2022-08-10 (×2): qty 0.12

## 2022-08-10 NOTE — Progress Notes (Signed)
Patient rested well during this shift. No complaints during this shift.

## 2022-08-10 NOTE — Progress Notes (Signed)
Progress Note   Patient: Mitchell Price [id number] DOB: 07-15-1945 DOA: 08/08/2022     0 DOS: the patient was seen and examined on 08/10/2022   Brief hospital course: As per H&P written by Dr. Denton Brick on 08/08/2022 Mitchell Price is a 77 y.o. male with medical history significant for dementia, hypertension, DVT history, diabetes mellitus, BPH. Patient was brought to the ED from care home with reports of high blood sugar, blood sugar was in the 400s.  Also reported lethargy.  On my evaluation, patient is initially sleeping, but arouses, fully awake alert and oriented to person and situation to some extent, able to answer questions.  He denies pain.  No difficulty breathing.  No chest pain.   Recent hospitalizations 1/10 to 2/15 -AKI secondary to dehydration in setting of diarrhea.Hospital stay was complicated by urinary retention, s/p failed voiding challenge.  Hence- Foley was replaced on 07/22/2022 with plans for repeating voiding challenge in 1 to 2 weeks.    Unfortunately group home sent patient back to the ED, because of the Foley, so he was hospitalized again 2/15 to 2/22-for urinary retention, he passed his voiding trial, so he was able to go back to his group home.   ED Course:  Stable vitals.  Blood glucose 440.  Anion gap of 11, serum bicarb of 19.  Bedside bladder scan revealed thousand 100 mL of urine, Foley catheter inserted drained 1600 mL of urine.  Foley was inserted, but again group home would not take patient's with Foley catheter hence hospitalization was requested.  Assessment and Plan: * Urinary retention -Likely 2/2 BPH.  Foley drained 1600 mL of urine.   -Creatinine stable 1.54. -Foley inserted; with plans to follow-up as an outpatient with urology service. -Patient's group home would not take patient with Foley catheter. -Continue tamsulosin 0.8 mg daily and finasteride 5 mg daily -No signs of acute infection currently.  Stage 3a chronic kidney disease (CKD)  (HCC) -Creatinine 1.54.   -Stable and at baseline currently. -Continue minimizing nephrotoxic agents and follow renal function trend.  History of DVT (deep vein thrombosis) -Continue Eliquis  Essential hypertension -Stable and well-controlled -Follow-up vital signs. -Continue current antihypertensive agents.  Dementia without behavioral disturbance ([place]) -Resides in a group home.   -Currently oriented to person only.   -At baseline ambulates without assistance or assistive devices; patient requires assistance and supervision for his ADLs due to underlying dementia. -Continue Seroquel  BPH (benign prostatic hyperplasia) -Outpatient follow-up with urology service recommended -Foley catheter in place due to ongoing symptoms of urinary retention -Continue Proscar and Flomax.  Hyperglycemia due to type 2 diabetes mellitus ([place]) -Blood glucose 440.  Normal anion gap 11, serum bicarb slightly low at 19.   -CBG improved with 10 units of insulin - Uncontrolled diabetes appreciated with most recent A1c 9. -On multiple oral agents as an outpatient (glimepiride, Actos, empagliflozin, metformin) -Continue sliding scale insulin and long-acting -Holding oral agents while inpatient. -Maintain adequate hydration.   PUD (peptic ulcer disease) -No nausea, no vomiting, no abdominal pain -No signs of overt bleeding -Continue PPI.   Subjective:  Pleasant, in no acute distress; oriented x 1.  Following commands appropriately.  Good urine output appreciated after Foley catheter placed.  Physical Exam: Vitals:   08/10/22 0407 08/10/22 0432 08/10/22 0841 08/10/22 1350  BP: (!) 99/53 104/77 111/68 (!) 109/93  Pulse: 72 75 75 74  Resp: '17  16 10  '$ Temp: 97.8 F (36.6 C)  98.2 F (36.8 C) 97.8  F (36.6 C)  TempSrc:   Oral Oral  SpO2: 100%  99% 100%   General exam: Alert, awake, oriented x 1; following commands appropriately; in no acute distress. Respiratory system: Clear to auscultation.  Respiratory effort normal.  Good saturation on room air. Cardiovascular system:RRR. No murmurs, rubs, gallops.  No JVD. Gastrointestinal system: Abdomen is nondistended, soft and nontender. No organomegaly or masses felt. Normal bowel sounds heard. Central nervous system: Alert and oriented. No focal neurological deficits. Extremities: No C/C/E, +pedal pulses.  Trace edema appreciated bilaterally. Skin: No petechiae. Psychiatry: Mood & affect appropriate.   Data Reviewed: CBG: 155>> 217.  Family Communication: No family at bedside.  Disposition: Status is: Observation The patient remains OBS appropriate and will d/c before 2 midnights.   Planned Discharge Destination: Skilled nursing facility  Time spent: 35 minutes  Author: Barton Dubois, MD 08/10/2022 3:32 PM  For on call review [email or web address].

## 2022-08-10 NOTE — TOC Progression Note (Signed)
Transition of Care [facility]) - Progression Note    Patient Details  Name: Mitchell Price MRN: [id number] Date of Birth: February 11, 1946  [facility] CM/SW Contact  Shade Flood, LCSW Phone Number: 08/10/2022, 12:06 PM  Clinical Narrative:     TOC following. Spoke with pt who is not fully oriented. Called pt's son to review bed offers. Offer from [facility] accepted. Updated Paula in admissions at [facility] Insurance auth started.   Per MD, pt medically ready for dc. Will assist with dc once authorization in place.  Expected Discharge Plan: [place] Barriers to Discharge: Continued Medical Work up  Expected Discharge Plan and Services       Living arrangements for the past 2 months: Group Home                                       Social Determinants of Health (SDOH) Interventions SDOH Screenings   Food Insecurity: Unknown (07/23/2022)  Transportation Needs: Unknown (07/23/2022)  Utilities: Unknown (07/23/2022)  Tobacco Use: High Risk (08/08/2022)    Readmission Risk Interventions     No data to display

## 2022-08-10 NOTE — Progress Notes (Signed)
Mobility Specialist Progress Note:    08/10/22 1430  Mobility  Activity Stood at bedside  Level of Assistance Standby assist, set-up cues, supervision of patient - no hands on  Assistive Device None  Activity Response Tolerated well  Mobility Referral No  $Mobility charge 1 Mobility   Responded to chair alarm, pt requested assistance. Pt was able to stand up at chair side to dress. Returned pt to chair with call bell in reach, chair alarm on, all needs met.   Royetta Crochet Mobility Specialist Please contact via S[facility]itor or  Rehab office at [phone]

## 2022-08-10 NOTE — Evaluation (Signed)
Physical Therapy Evaluation Patient Details Name: Mitchell Price MRN: [id number] DOB: 26-Apr-1946 Today's Date: 08/10/2022  History of Present Illness  Mitchell Price is a 77 y.o. male with medical history significant for dementia, hypertension, DVT history, diabetes mellitus, BPH.  Patient was brought to the ED from care home with reports of high blood sugar, blood sugar was in the 400s.  Also reported lethargy.  On my evaluation, patient is initially sleeping, but arouses, fully awake alert and oriented to person and situation to some extent, able to answer questions.  He denies pain.  No difficulty breathing.  No chest pain.   Clinical Impression  Patient demonstrates slightly labored movement for sitting up at bedside, slightly unsteady gait and limited to ambulating in room due to c/o fatigue.  Patient tolerated sitting up in chair after therapy - nursing staff notified.  Patient will benefit from continued skilled physical therapy in hospital and recommended venue below to increase strength, balance, endurance for safe ADLs and gait.         Recommendations for follow up therapy are one component of a multi-disciplinary discharge planning process, led by the attending physician.  Recommendations may be updated based on patient status, additional functional criteria and insurance authorization.  Follow Up Recommendations Skilled nursing-short term rehab (<3 hours/day) Can patient physically be transported by private vehicle: Yes    Assistance Recommended at Discharge Intermittent Supervision/Assistance  Patient can return home with the following  A little help with walking and/or transfers;A little help with bathing/dressing/bathroom;Help with stairs or ramp for entrance;Assistance with cooking/housework    Equipment Recommendations None recommended by PT  Recommendations for Other Services       Functional Status Assessment Patient has had a recent decline in their functional  status and demonstrates the ability to make significant improvements in function in a reasonable and predictable amount of time.     Precautions / Restrictions Precautions Precautions: Fall Restrictions Weight Bearing Restrictions: No      Mobility  Bed Mobility Overal bed mobility: Needs Assistance Bed Mobility: Supine to Sit     Supine to sit: Supervision     General bed mobility comments: slightly labored movement    Transfers Overall transfer level: Needs assistance Equipment used: 1 person hand held assist Transfers: Sit to/from Stand, Bed to chair/wheelchair/BSC Sit to Stand: Min guard   Step pivot transfers: Min guard            Ambulation/Gait Ambulation/Gait assistance: Herbalist (Feet): 20 Feet Assistive device: 1 person hand held assist, None Gait Pattern/deviations: Decreased step length - right, Decreased step length - left, Decreased stride length Gait velocity: decreased     General Gait Details: sllightly labored cadence without loss of balance, limited secondary to c/o fatigue  Stairs            Wheelchair Mobility    Modified Rankin (Stroke Patients Only)       Balance Overall balance assessment: Needs assistance Sitting-balance support: Feet supported, No upper extremity supported Sitting balance-Leahy Scale: Good Sitting balance - Comments: seated at EOB   Standing balance support: During functional activity, No upper extremity supported Standing balance-Leahy Scale: Fair Standing balance comment: hand held assist                             Pertinent Vitals/Pain Pain Assessment Pain Assessment: No/denies pain    Home Living Family/patient expects to be discharged to:: Private  residence Living Arrangements: Group Home   Type of Home: [place] Equipment: None      Prior Function Prior Level of Function : Needs assist       Physical Assist : Mobility (physical);ADLs  (physical) Mobility (physical): Bed mobility;Transfers;Gait;Stairs   Mobility Comments: household ambulator without AD ADLs Comments: assisted by group home staff     Hand Dominance        Extremity/Trunk Assessment   Upper Extremity Assessment Upper Extremity Assessment: Overall WFL for tasks assessed    Lower Extremity Assessment Lower Extremity Assessment: Generalized weakness    Cervical / Trunk Assessment Cervical / Trunk Assessment: Normal  Communication   Communication: No difficulties  Cognition Arousal/Alertness: Awake/alert Behavior During Therapy: WFL for tasks assessed/performed Overall Cognitive Status: History of cognitive impairments - at baseline                                          General Comments      Exercises     Assessment/Plan    PT Assessment Patient needs continued PT services  PT Problem List Decreased strength;Decreased activity tolerance;Decreased balance;Decreased mobility       PT Treatment Interventions DME instruction;Gait training;Stair training;Functional mobility training;Therapeutic activities;Therapeutic exercise;Patient/family education;Balance training    PT Goals (Current goals can be found in the Care Plan section)  Acute Rehab PT Goals Patient Stated Goal: return home PT Goal Formulation: With patient Time For Goal Achievement: 08/25/22 Potential to Achieve Goals: Good    Frequency Min 3X/week     Co-evaluation               AM-PAC PT "6 Clicks" Mobility  Outcome Measure Help needed turning from your back to your side while in a flat bed without using bedrails?: None Help needed moving from lying on your back to sitting on the side of a flat bed without using bedrails?: A Little Help needed moving to and from a bed to a chair (including a wheelchair)?: A Little Help needed standing up from a chair using your arms (e.g., wheelchair or bedside chair)?: A Little Help needed to walk in  hospital room?: A Little Help needed climbing 3-5 steps with a railing? : A Lot 6 Click Score: 18    End of Session   Activity Tolerance: Patient tolerated treatment well;Patient limited by fatigue Patient left: in chair;with call bell/phone within reach Nurse Communication: Mobility status PT Visit Diagnosis: Unsteadiness on feet (R26.81);Other abnormalities of gait and mobility (R26.89);Muscle weakness (generalized) (M62.81)    Time: [id number] PT Time Calculation (min) (ACUTE ONLY): 18 min   Charges:   PT Evaluation $PT Eval Low Complexity: 1 Low PT Treatments $Therapeutic Activity: 8-22 mins        11:59 AM, 08/10/22 Lonell Grandchild, MPT Physical Therapist with [facility] [phone] office [phone] mobile phone

## 2022-08-10 NOTE — Plan of Care (Signed)
  Problem: Acute Rehab PT Goals(only PT should resolve) Goal: Pt Will Go Supine/Side To Sit Outcome: Progressing Flowsheets (Taken 08/10/2022 1201) Pt will go Supine/Side to Sit:  with modified independence  with supervision Goal: Patient Will Transfer Sit To/From Stand Outcome: Progressing Flowsheets (Taken 08/10/2022 1201) Patient will transfer sit to/from stand:  with modified independence  with supervision Goal: Pt Will Transfer Bed To Chair/Chair To Bed Outcome: Progressing Flowsheets (Taken 08/10/2022 1201) Pt will Transfer Bed to Chair/Chair to Bed:  with modified independence  with supervision Goal: Pt Will Ambulate Outcome: Progressing Flowsheets (Taken 08/10/2022 1201) Pt will Ambulate:  75 feet  with least restrictive assistive device  with supervision  with min guard assist   12:02 PM, 08/10/22 Lonell Grandchild, MPT Physical Therapist with [facility] [phone] office [phone] mobile phone

## 2022-08-11 DIAGNOSIS — F0390 Unspecified dementia without behavioral disturbance: Secondary | ICD-10-CM | POA: Diagnosis not present

## 2022-08-11 DIAGNOSIS — R339 Retention of urine, unspecified: Secondary | ICD-10-CM | POA: Diagnosis not present

## 2022-08-11 DIAGNOSIS — N1831 Chronic kidney disease, stage 3a: Secondary | ICD-10-CM | POA: Diagnosis not present

## 2022-08-11 DIAGNOSIS — N401 Enlarged prostate with lower urinary tract symptoms: Secondary | ICD-10-CM | POA: Diagnosis not present

## 2022-08-11 LAB — BASIC METABOLIC PANEL
Anion gap: 12 (ref 5–15)
BUN: 30 mg/dL — ABNORMAL HIGH (ref 8–23)
CO2: 21 mmol/L — ABNORMAL LOW (ref 22–32)
Calcium: 9.3 mg/dL (ref 8.9–10.3)
Chloride: 102 mmol/L (ref 98–111)
Creatinine, Ser: 1.52 mg/dL — ABNORMAL HIGH (ref 0.61–1.24)
GFR, Estimated: 47 mL/min — ABNORMAL LOW (ref >60)
Glucose, Bld: 283 mg/dL — ABNORMAL HIGH (ref 70–99)
Potassium: 4.2 mmol/L (ref 3.5–5.1)
Sodium: 135 mmol/L (ref 135–145)

## 2022-08-11 LAB — CBC
HCT: 38.7 % — ABNORMAL LOW (ref 39.0–52.0)
Hemoglobin: 12.7 g/dL — ABNORMAL LOW (ref 13.0–17.0)
MCH: 32.6 pg (ref 26.0–34.0)
MCHC: 32.8 g/dL (ref 30.0–36.0)
MCV: 99.2 fL (ref 80.0–100.0)
Platelets: 188 10*3/uL (ref 150–400)
RBC: 3.9 MIL/uL — ABNORMAL LOW (ref 4.22–5.81)
RDW: 12.7 % (ref 11.5–15.5)
WBC: 4.8 10*3/uL (ref 4.0–10.5)
nRBC: 0 % (ref 0.0–0.2)

## 2022-08-11 LAB — GLUCOSE, CAPILLARY
Glucose-Capillary: 293 mg/dL — ABNORMAL HIGH (ref 70–99)
Glucose-Capillary: 282 mg/dL — ABNORMAL HIGH (ref 70–99)

## 2022-08-11 NOTE — Progress Notes (Signed)
Report called to [facility] at [facility]. Awaiting EMS.

## 2022-08-11 NOTE — Discharge Summary (Signed)
Physician Discharge Summary   Patient: Mitchell Price MRN: [id number] DOB: 27-Mar-1946  Admit date:     08/08/2022  Discharge date: 08/11/22  Discharge Physician: Barton Dubois   PCP: Doretha Imus, PA-C   Recommendations at discharge:  Repeat basic metabolic panel to follow electrolytes and renal function Repeat CBC to follow hemoglobin trend/stability.  Discharge Diagnoses: Principal Problem:   Urinary retention Active Problems:   Stage 3a chronic kidney disease (CKD) (HCC)   History of DVT (deep vein thrombosis)   Essential hypertension   Dementia without behavioral disturbance (HCC)   PUD (peptic ulcer disease)   Hyperglycemia due to type 2 diabetes mellitus (HCC)   BPH (benign prostatic hyperplasia)  Hospital Course: As per H&P written by Dr. Denton Brick on 08/08/2022 Mitchell Price is a 77 y.o. male with medical history significant for dementia, hypertension, DVT history, diabetes mellitus, BPH. Patient was brought to the ED from care home with reports of high blood sugar, blood sugar was in the 400s.  Also reported lethargy.  On my evaluation, patient is initially sleeping, but arouses, fully awake alert and oriented to person and situation to some extent, able to answer questions.  He denies pain.  No difficulty breathing.  No chest pain.   Recent hospitalizations 1/10 to 2/15 -[facility] secondary to dehydration in setting of diarrhea.Hospital stay was complicated by urinary retention, s/p failed voiding challenge.  Hence- Foley was replaced on 07/22/2022 with plans for repeating voiding challenge in 1 to 2 weeks.    Unfortunately group home sent patient back to the ED, because of the Foley, so he was hospitalized again 2/15 to 2/22-for urinary retention, he passed his voiding trial, so he was able to go back to his group home.   ED Course:  Stable vitals.  Blood glucose 440.  Anion gap of 11, serum bicarb of 19.  Bedside bladder scan revealed thousand 100 mL of urine, Foley  catheter inserted drained 1600 mL of urine.  Foley was inserted, but again group home would not take patient's with Foley catheter hence hospitalization was requested.  Assessment and Plan: * Urinary retention -Likely 2/2 BPH.  Foley drained 1600 mL of urine.   -Creatinine stable 1.54. -Foley inserted; with plans to follow-up as an outpatient with urology service. -Patient's group home would not take patient with Foley catheter. -Continue tamsulosin 0.8 mg daily and finasteride 5 mg daily -No signs of acute infection currently.  Stage 3a chronic kidney disease (CKD) (HCC) -Creatinine 1.54.   -Stable and at baseline currently. -Continue minimizing nephrotoxic agents and follow renal function trend.  History of DVT (deep vein thrombosis) -Continue Eliquis  Essential hypertension -Stable and well-controlled -Follow-up vital signs. -Continue current antihypertensive agents.  Dementia without behavioral disturbance ([place]) -Resides in a group home.   -Currently oriented to person only.   -At baseline ambulates without assistance or assistive devices; patient requires assistance and supervision for his ADLs due to underlying dementia. -Continue Seroquel  BPH (benign prostatic hyperplasia) -Outpatient follow-up with urology service recommended -Foley catheter in place due to ongoing symptoms of urinary retention -Continue Proscar and Flomax.  Hyperglycemia due to type 2 diabetes mellitus ([place]) -Blood glucose 440.  Normal anion gap 11, serum bicarb slightly low at 19.   -CBG improved with 10 units of insulin - Uncontrolled diabetes appreciated with most recent A1c 9. -On multiple oral agents as an outpatient (glimepiride, Actos, empagliflozin, metformin) -Continue sliding scale insulin and long-acting -Holding oral agents while inpatient. -Maintain adequate hydration.  PUD (peptic ulcer disease) -No nausea, no vomiting, no abdominal pain -No signs of overt bleeding -Continue  PPI.   Consultants: Urology curbside at time of admission (Dr. Junious Silk). Procedures performed: See below for x-ray reports. Disposition: SNF ([facility]) Diet recommendation:   DISCHARGE MEDICATION: Allergies as of 08/11/2022   No Known Allergies      Medication List     TAKE these medications    acetaminophen 500 MG tablet Commonly known as: TYLENOL Take 1 tablet (500 mg total) by mouth every 6 (six) hours as needed for mild pain.   apixaban 5 MG Tabs tablet Commonly known as: Eliquis Take 1 tablet (5 mg total) by mouth 2 (two) times daily. TAKE 1 TABLET(5 MG) BY MOUTH TWICE DAILY What changed: additional instructions   atorvastatin 40 MG tablet Commonly known as: LIPITOR Take 1 tablet (40 mg total) by mouth daily. What changed: when to take this   docusate sodium 100 MG capsule Commonly known as: COLACE Take 1 capsule (100 mg total) by mouth 2 (two) times daily as needed for mild constipation.   empagliflozin 25 MG Tabs tablet Commonly known as: JARDIANCE Take 1 tablet (25 mg total) by mouth daily.   ferrous sulfate 325 (65 FE) MG EC tablet Take 1 tablet (325 mg total) by mouth daily with breakfast.   finasteride 5 MG tablet Commonly known as: PROSCAR Take 1 tablet (5 mg total) by mouth daily.   gabapentin 300 MG capsule Commonly known as: NEURONTIN Take 1 capsule (300 mg total) by mouth daily. What changed: when to take this   glimepiride 4 MG tablet Commonly known as: AMARYL Take 1 tablet (4 mg total) by mouth every morning.   melatonin 5 MG Tabs Take 1 tablet (5 mg total) by mouth at bedtime.   metFORMIN 1000 MG tablet Commonly known as: GLUCOPHAGE Take 1 tablet (1,000 mg total) by mouth 2 (two) times daily.   metoprolol tartrate 25 MG tablet Commonly known as: LOPRESSOR Take 1 tablet (25 mg total) by mouth 2 (two) times daily.   pioglitazone 45 MG tablet Commonly known as: ACTOS Take 1 tablet (45 mg total) by mouth daily.   QUEtiapine 25  MG tablet Commonly known as: SEROQUEL Take 1 tablet (25 mg total) by mouth 2 (two) times daily.   tamsulosin 0.4 MG Caps capsule Commonly known as: FLOMAX Take 2 capsules (0.8 mg total) by mouth daily after supper.   traZODone 50 MG tablet Commonly known as: DESYREL Take 0.5 tablets (25 mg total) by mouth at bedtime.        Contact information for follow-up providers     McKenzie, Candee Furbish, MD. Schedule an appointment as soon as possible for a visit in 10 day(s).   Specialty: Urology Contact information: [address]  [place] [address] [phone]         Doretha Imus, [place]. Schedule an appointment as soon as possible for a visit in 2 week(s).   Specialty: Gastroenterology Why: after discharge from SNF Contact information: [id number] [place] [address] [phone]              Contact information for after-discharge care     [facility] .   Service: Skilled Nursing Contact information: [place] [phone]                    Discharge Exam: General exam:  Alert, awake, oriented x 1; following commands appropriately; in no acute distress. Respiratory system: Clear to auscultation. Respiratory effort normal.  Good saturation on room air. Cardiovascular system:RRR. No murmurs, rubs, gallops.  No JVD. Gastrointestinal system: Abdomen is nondistended, soft and nontender. No organomegaly or masses felt. Normal bowel sounds heard. Central nervous system: Alert and oriented. No focal neurological deficits. Extremities: No C/C/E, +pedal pulses.  Trace edema appreciated bilaterally. Skin: No petechiae. Psychiatry: Mood & affect appropriate.   Condition at discharge: Stable and improved.  The results of significant diagnostics from this hospitalization (including imaging, microbiology, ancillary and laboratory) are listed below for reference.    Imaging Studies: US SCROTUM W/DOPPLER  Result Date: 07/18/2022 CLINICAL DATA:  [id number] Testicular discomfort [id number] EXAM: ULTRASOUND OF SCROTUM TECHNIQUE: Complete ultrasound examination of the testicles, epididymis, and other scrotal structures was performed. COMPARISON:  None Available. FINDINGS: The right testis measured 3.9 x 2.9 x 1.9 cm, and the left testis measured 3.7 x 2.8 x 1.8 cm. The testes demonstrated blood flow with Doppler. There is a cysts identified of the tunica albuginea on the left measuring 0.4 cm. Left epididymal cyst measures 0.4 cm. No fluid collections or masses were seen. IMPRESSION: Cysts identified left epididymis and tunica albuginea. No acute scrotal pathology or testicular abnormalities. Electronically Signed   By: Sammie Bench M.D.   On: 07/18/2022 11:22   CT HEAD WO CONTRAST (5MM)  Result Date: 07/15/2022 CLINICAL DATA:  Head trauma. EXAM: CT HEAD WITHOUT CONTRAST TECHNIQUE: Contiguous axial images were obtained from the base of the skull through the vertex without intravenous contrast. RADIATION DOSE REDUCTION: This exam was performed according to the departmental dose-optimization program which includes automated exposure control, adjustment of the mA and/or kV according to patient size and/or use of iterative reconstruction technique. COMPARISON:  Head CT 06/23/2022. FINDINGS: Brain: No acute hemorrhage, mass effect or midline shift. Stable chronic small-vessel disease. Gray-white differentiation is otherwise preserved. No hydrocephalus. No extra-axial collection. Basilar cisterns are patent. Vascular: No hyperdense vessel or unexpected calcification. Skull: No calvarial fracture or suspicious bone lesion. Skull base is unremarkable. Sinuses/Orbits: Partial opacification of the ethmoid air cells. Mastoids are well aerated. Orbits are unremarkable. Other: No scalp hematoma. IMPRESSION: 1. No acute intracranial injury or calvarial fracture. 2. Stable chronic  small-vessel disease. Electronically Signed   By: Emmit Alexanders M.D.   On: 07/15/2022 08:55    Microbiology: Results for orders placed or performed during the hospital encounter of 06/17/22  Resp panel by RT-PCR (RSV, Flu A&B, Covid) Anterior Nasal Swab     Status: None   Collection Time: 07/08/22  2:24 AM   Specimen: Anterior Nasal Swab  Result Value Ref Range Status   SARS Coronavirus 2 by RT PCR NEGATIVE NEGATIVE Final    Comment: (NOTE) SARS-CoV-2 target nucleic acids are NOT DETECTED.  The SARS-CoV-2 RNA is generally detectable in upper respiratory specimens during the acute phase of infection. The lowest concentration of SARS-CoV-2 viral copies this assay can detect is 138 copies/mL. A negative result does not preclude SARS-Cov-2 infection and should not be used as the sole basis for treatment or other patient management decisions. A negative result may occur with  improper specimen collection/handling, submission of specimen other than nasopharyngeal swab, presence of viral mutation(s) within the areas targeted by this assay, and inadequate number of viral copies(<138 copies/mL). A negative result must be combined with clinical observations, patient history, and epidemiological information. The expected result is Negative.  Fact Sheet for Patients:  EntrepreneurPulse.com.au  Fact Sheet for Healthcare Providers:  IncredibleEmployment.be  This test is no t yet approved or cleared by the [place] FDA and  has been authorized for detection and/or diagnosis of SARS-CoV-2 by FDA under an Emergency Use Authorization (EUA). This EUA will remain  in effect (meaning this test can be used) for the duration of the COVID-19 declaration under Section 564(b)(1) of the Act, 21 U.S.C.section 360bbb-3(b)(1), unless the authorization is terminated  or revoked sooner.       Influenza A by PCR NEGATIVE NEGATIVE Final   Influenza B by PCR NEGATIVE  NEGATIVE Final    Comment: (NOTE) The Xpert Xpress SARS-CoV-2/FLU/RSV plus assay is intended as an aid in the diagnosis of influenza from Nasopharyngeal swab specimens and should not be used as a sole basis for treatment. Nasal washings and aspirates are unacceptable for Xpert Xpress SARS-CoV-2/FLU/RSV testing.  Fact Sheet for Patients: EntrepreneurPulse.com.au  Fact Sheet for Healthcare Providers: IncredibleEmployment.be  This test is not yet approved or cleared by the [place] FDA and has been authorized for detection and/or diagnosis of SARS-CoV-2 by FDA under an Emergency Use Authorization (EUA). This EUA will remain in effect (meaning this test can be used) for the duration of the COVID-19 declaration under Section 564(b)(1) of the Act, 21 U.S.C. section 360bbb-3(b)(1), unless the authorization is terminated or revoked.     Resp Syncytial Virus by PCR NEGATIVE NEGATIVE Final    Comment: (NOTE) Fact Sheet for Patients: EntrepreneurPulse.com.au  Fact Sheet for Healthcare Providers: IncredibleEmployment.be  This test is not yet approved or cleared by the [place] FDA and has been authorized for detection and/or diagnosis of SARS-CoV-2 by FDA under an Emergency Use Authorization (EUA). This EUA will remain in effect (meaning this test can be used) for the duration of the COVID-19 declaration under Section 564(b)(1) of the Act, 21 U.S.C. section 360bbb-3(b)(1), unless the authorization is terminated or revoked.  Performed at [facility], [address] [place], [place] [address]     Labs: CBC: Recent Labs  Lab 08/08/22 1315 08/11/22 0351  WBC 6.6 4.8  HGB 12.5* 12.7*  HCT 38.3* 38.7*  MCV 100.5* 99.2  PLT 218 0000000   Basic Metabolic Panel: Recent Labs  Lab 08/08/22 1315 08/09/22 0705 08/11/22 0351  NA 135 138 135  K 4.5 3.9 4.2  CL 105 110 102  CO2 19* 21* 21*   GLUCOSE 440* 232* 283*  BUN 25* 22 30*  CREATININE 1.54* 1.26* 1.52*  CALCIUM 9.1 8.6* 9.3   CBG: Recent Labs  Lab 08/10/22 0721 08/10/22 1128 08/10/22 1607 08/10/22 2247 08/11/22 0740  GLUCAP 155* 217* 265* 365* 293*    Discharge time spent: greater than 30 minutes.  Signed: Barton Dubois, MD [facility] 08/11/2022

## 2022-08-11 NOTE — TOC Transition Note (Signed)
Transition of Care [facility]) - CM/SW Discharge Note   Patient Details  Name: [name] MRN: [id number] Date of Birth: 08-28-1945  [facility] CM/SW Contact:  Shade Flood, LCSW Phone Number: 08/11/2022, 10:40 AM   Clinical Narrative:     Pt medically stable for dc per MD. Pt has SNF insurance auth. Updated Nevin Bloodgood at [facility] and they can admit pt today. Updated pt's son who remains in agreement with the dc plan.  DC clinical sent electronically. RN to call report. EMS arranged.  No other TOC needs for dc.  Final next level of care: Skilled Nursing Facility Barriers to Discharge: Barriers Resolved   Patient Goals and CMS Choice      Discharge Placement PASRR number recieved: 08/09/22 PASRR number recieved: 08/09/22            Patient chooses bed at: [facility] Patient to be transferred to facility by: EMS Name of family member notified: [name] Patient and family notified of of transfer: 08/11/22  Discharge Plan and Services Additional resources added to the After Visit Summary for                                       Social Determinants of Health (SDOH) Interventions SDOH Screenings   Food Insecurity: Unknown (08/11/2022)  Transportation Needs: Unknown (08/11/2022)  Utilities: Unknown (08/11/2022)  Tobacco Use: High Risk (08/08/2022)     Readmission Risk Interventions     No data to display

## 2022-08-18 ENCOUNTER — Ambulatory Visit: Payer: Medicare HMO | Admitting: Urology
